# Patient Record
Sex: Female | Born: 1965
Health system: Southern US, Community
[De-identification: ages and names within clinical notes are randomized; demographics above are authoritative.]

## PROBLEM LIST (undated history)

## (undated) DIAGNOSIS — I10 Essential (primary) hypertension: Secondary | ICD-10-CM

## (undated) HISTORY — PX: BREAST BIOPSY: SHX20

## (undated) HISTORY — PX: BREAST CYST EXCISION: SHX579

## (undated) HISTORY — DX: Essential (primary) hypertension: I10

---

## 1999-02-15 HISTORY — PX: BREAST BIOPSY: SHX20

## 1999-02-15 HISTORY — PX: BREAST SURGERY: SHX581

## 1999-07-18 HISTORY — PX: APPENDECTOMY: SHX54

## 2006-09-16 LAB — CONVERTED CEMR LAB

## 2006-09-16 LAB — HM MAMMOGRAPHY

## 2006-11-04 DIAGNOSIS — I10 Essential (primary) hypertension: Secondary | ICD-10-CM

## 2006-11-04 HISTORY — DX: Essential (primary) hypertension: I10

## 2007-02-15 HISTORY — PX: LAPAROSCOPY: SHX197

## 2007-07-05 LAB — CONVERTED CEMR LAB
Cholesterol: 152 mg/dL
HDL: 35 mg/dL
LDL Cholesterol: 97 mg/dL
Triglycerides: 101 mg/dL

## 2008-01-10 ENCOUNTER — Ambulatory Visit: Payer: Self-pay | Admitting: Family Medicine

## 2008-01-10 DIAGNOSIS — J309 Allergic rhinitis, unspecified: Secondary | ICD-10-CM | POA: Insufficient documentation

## 2008-01-10 DIAGNOSIS — J45909 Unspecified asthma, uncomplicated: Secondary | ICD-10-CM | POA: Insufficient documentation

## 2008-01-10 DIAGNOSIS — E118 Type 2 diabetes mellitus with unspecified complications: Secondary | ICD-10-CM | POA: Insufficient documentation

## 2008-01-10 DIAGNOSIS — E559 Vitamin D deficiency, unspecified: Secondary | ICD-10-CM | POA: Insufficient documentation

## 2008-01-10 LAB — CONVERTED CEMR LAB
Albumin/Creatinine Ratio, Urine, POC: <30
Blood Glucose, Fasting: 153 mg/dL
Creatinine, urine POC: 100 mg/dL
Hgb A1c MFr Bld: 6.1 %
Microalbumin U total vol: 30 mg/L

## 2008-01-13 LAB — CONVERTED CEMR LAB
ALT: 16 units/L (ref 0–35)
AST: 16 units/L (ref 0–37)
Albumin: 4.3 g/dL (ref 3.5–5.2)
Alkaline Phosphatase: 66 units/L (ref 39–117)
BUN: 7 mg/dL (ref 6–23)
CO2: 23 meq/L (ref 19–32)
Calcium: 8.9 mg/dL (ref 8.4–10.5)
Chloride: 106 meq/L (ref 96–112)
Creatinine, Ser: 0.61 mg/dL (ref 0.40–1.20)
Glucose, Bld: 110 mg/dL — ABNORMAL HIGH (ref 70–99)
Potassium: 4.4 meq/L (ref 3.5–5.3)
Sodium: 142 meq/L (ref 135–145)
Total Bilirubin: 0.3 mg/dL (ref 0.3–1.2)
Total Protein: 7.1 g/dL (ref 6.0–8.3)
Vit D, 1,25-Dihydroxy: 26 — ABNORMAL LOW (ref 30–89)

## 2008-01-26 ENCOUNTER — Encounter: Payer: Self-pay | Admitting: Family Medicine

## 2008-03-16 ENCOUNTER — Encounter: Payer: Self-pay | Admitting: Family Medicine

## 2008-03-29 ENCOUNTER — Encounter: Payer: Self-pay | Admitting: Family Medicine

## 2008-04-12 ENCOUNTER — Ambulatory Visit: Payer: Self-pay | Admitting: Family Medicine

## 2008-04-12 ENCOUNTER — Encounter: Admission: RE | Admit: 2008-04-12 | Discharge: 2008-04-12 | Payer: Self-pay | Admitting: Family Medicine

## 2008-04-12 DIAGNOSIS — R002 Palpitations: Secondary | ICD-10-CM | POA: Insufficient documentation

## 2008-04-12 LAB — CONVERTED CEMR LAB
Blood Glucose, Fasting: 104 mg/dL
Hgb A1c MFr Bld: 6.7 %

## 2008-04-13 LAB — CONVERTED CEMR LAB
ALT: 17 units/L (ref 0–35)
AST: 13 units/L (ref 0–37)
Albumin: 4.1 g/dL (ref 3.5–5.2)
Alkaline Phosphatase: 76 units/L (ref 39–117)
BUN: 7 mg/dL (ref 6–23)
CO2: 24 meq/L (ref 19–32)
Calcium: 9.2 mg/dL (ref 8.4–10.5)
Chloride: 106 meq/L (ref 96–112)
Creatinine, Ser: 0.67 mg/dL (ref 0.40–1.20)
Glucose, Bld: 66 mg/dL — ABNORMAL LOW (ref 70–99)
HCT: 43.6 % (ref 36.0–46.0)
Hemoglobin: 13.7 g/dL (ref 12.0–15.0)
MCHC: 31.4 g/dL (ref 30.0–36.0)
MCV: 88.3 fL (ref 78.0–100.0)
Platelets: 295 10*3/uL (ref 150–400)
Potassium: 4.6 meq/L (ref 3.5–5.3)
RBC: 4.94 M/uL (ref 3.87–5.11)
RDW: 13.6 % (ref 11.5–15.5)
Sed Rate: 9 mm/hr (ref 0–22)
Sodium: 141 meq/L (ref 135–145)
TSH: 1.949 microintl units/mL (ref 0.350–5.50)
Total Bilirubin: 0.4 mg/dL (ref 0.3–1.2)
Total Protein: 6.7 g/dL (ref 6.0–8.3)
WBC: 10.8 10*3/uL — ABNORMAL HIGH (ref 4.0–10.5)

## 2008-04-25 ENCOUNTER — Ambulatory Visit: Payer: Self-pay | Admitting: Cardiology

## 2008-04-26 ENCOUNTER — Ambulatory Visit: Payer: Self-pay

## 2008-05-02 ENCOUNTER — Encounter: Payer: Self-pay | Admitting: Family Medicine

## 2008-05-02 ENCOUNTER — Ambulatory Visit: Payer: Self-pay

## 2008-06-06 ENCOUNTER — Ambulatory Visit: Payer: Self-pay | Admitting: Cardiology

## 2008-07-11 ENCOUNTER — Ambulatory Visit: Payer: Self-pay | Admitting: Family Medicine

## 2008-07-11 LAB — CONVERTED CEMR LAB
Blood Glucose, Fasting: 189 mg/dL
Hgb A1c MFr Bld: 7.2 %

## 2008-07-12 LAB — CONVERTED CEMR LAB
ALT: 20 units/L (ref 0–35)
AST: 19 units/L (ref 0–37)
Albumin: 4.1 g/dL (ref 3.5–5.2)
Alkaline Phosphatase: 82 units/L (ref 39–117)
BUN: 8 mg/dL (ref 6–23)
Basophils Absolute: 0 10*3/uL (ref 0.0–0.1)
Basophils Relative: 0 % (ref 0–1)
CO2: 22 meq/L (ref 19–32)
Calcium: 9.2 mg/dL (ref 8.4–10.5)
Chloride: 107 meq/L (ref 96–112)
Creatinine, Ser: 0.73 mg/dL (ref 0.40–1.20)
Eosinophils Absolute: 0.2 10*3/uL (ref 0.0–0.7)
Eosinophils Relative: 1 % (ref 0–5)
Glucose, Bld: 161 mg/dL — ABNORMAL HIGH (ref 70–99)
HCT: 39.2 % (ref 36.0–46.0)
Hemoglobin: 12.6 g/dL (ref 12.0–15.0)
Lymphocytes Relative: 31 % (ref 12–46)
Lymphs Abs: 3.7 10*3/uL (ref 0.7–4.0)
MCHC: 32.1 g/dL (ref 30.0–36.0)
MCV: 85.8 fL (ref 78.0–100.0)
Monocytes Absolute: 0.5 10*3/uL (ref 0.1–1.0)
Monocytes Relative: 4 % (ref 3–12)
Neutro Abs: 7.4 10*3/uL (ref 1.7–7.7)
Neutrophils Relative %: 63 % (ref 43–77)
Platelets: 283 10*3/uL (ref 150–400)
Potassium: 4.1 meq/L (ref 3.5–5.3)
RBC: 4.57 M/uL (ref 3.87–5.11)
RDW: 13.4 % (ref 11.5–15.5)
Sodium: 141 meq/L (ref 135–145)
Total Bilirubin: 0.3 mg/dL (ref 0.3–1.2)
Total Protein: 6.8 g/dL (ref 6.0–8.3)
WBC: 11.8 10*3/uL — ABNORMAL HIGH (ref 4.0–10.5)

## 2008-07-25 ENCOUNTER — Ambulatory Visit: Payer: Self-pay | Admitting: Family Medicine

## 2008-07-31 ENCOUNTER — Telehealth: Payer: Self-pay | Admitting: Family Medicine

## 2008-08-16 ENCOUNTER — Ambulatory Visit: Payer: Self-pay | Admitting: Family Medicine

## 2008-10-12 ENCOUNTER — Telehealth (INDEPENDENT_AMBULATORY_CARE_PROVIDER_SITE_OTHER): Payer: Self-pay | Admitting: *Deleted

## 2008-10-17 ENCOUNTER — Encounter: Payer: Self-pay | Admitting: Family Medicine

## 2008-10-18 LAB — CONVERTED CEMR LAB
Cholesterol: 144 mg/dL (ref 0–200)
HDL: 35 mg/dL — ABNORMAL LOW (ref >39)
LDL Cholesterol: 88 mg/dL (ref 0–99)
Total CHOL/HDL Ratio: 4.1
Triglycerides: 104 mg/dL (ref <150)
VLDL: 21 mg/dL (ref 0–40)

## 2008-11-29 ENCOUNTER — Emergency Department (HOSPITAL_BASED_OUTPATIENT_CLINIC_OR_DEPARTMENT_OTHER): Admission: EM | Admit: 2008-11-29 | Discharge: 2008-11-29 | Payer: Self-pay | Admitting: Emergency Medicine

## 2008-11-29 ENCOUNTER — Ambulatory Visit: Payer: Self-pay | Admitting: Family Medicine

## 2008-11-29 ENCOUNTER — Ambulatory Visit: Payer: Self-pay | Admitting: Interventional Radiology

## 2008-11-29 DIAGNOSIS — I10 Essential (primary) hypertension: Secondary | ICD-10-CM | POA: Insufficient documentation

## 2008-11-30 ENCOUNTER — Encounter: Payer: Self-pay | Admitting: Family Medicine

## 2009-01-08 ENCOUNTER — Ambulatory Visit: Payer: Self-pay | Admitting: Family Medicine

## 2009-01-08 LAB — CONVERTED CEMR LAB
Creatinine,U: 300 mg/dL
Hgb A1c MFr Bld: 6.2 %
Microalbumin U total vol: 30 mg/L

## 2009-01-11 ENCOUNTER — Telehealth: Payer: Self-pay | Admitting: Family Medicine

## 2009-06-28 ENCOUNTER — Ambulatory Visit: Payer: Self-pay | Admitting: Family Medicine

## 2009-07-01 LAB — CONVERTED CEMR LAB
ALT: 21 units/L (ref 0–35)
AST: 20 units/L (ref 0–37)
Albumin: 4.1 g/dL (ref 3.5–5.2)
Alkaline Phosphatase: 69 units/L (ref 39–117)
BUN: 7 mg/dL (ref 6–23)
CO2: 24 meq/L (ref 19–32)
Calcium: 9.1 mg/dL (ref 8.4–10.5)
Chloride: 106 meq/L (ref 96–112)
Creatinine, Ser: 0.67 mg/dL (ref 0.40–1.20)
Glucose, Bld: 152 mg/dL — ABNORMAL HIGH (ref 70–99)
Potassium: 4.5 meq/L (ref 3.5–5.3)
Sodium: 140 meq/L (ref 135–145)
Total Bilirubin: 0.4 mg/dL (ref 0.3–1.2)
Total Protein: 6.6 g/dL (ref 6.0–8.3)

## 2009-08-07 ENCOUNTER — Telehealth: Payer: Self-pay | Admitting: Family Medicine

## 2009-08-22 ENCOUNTER — Ambulatory Visit: Payer: Self-pay | Admitting: Family Medicine

## 2009-08-22 ENCOUNTER — Encounter: Admission: RE | Admit: 2009-08-22 | Discharge: 2009-08-22 | Payer: Self-pay | Admitting: Family Medicine

## 2009-08-23 LAB — CONVERTED CEMR LAB
Basophils Absolute: 0.1 10*3/uL (ref 0.0–0.1)
Basophils Relative: 0 % (ref 0–1)
Eosinophils Absolute: 0.1 10*3/uL (ref 0.0–0.7)
Eosinophils Relative: 1 % (ref 0–5)
HCT: 43 % (ref 36.0–46.0)
Hemoglobin: 13.2 g/dL (ref 12.0–15.0)
Lymphocytes Relative: 30 % (ref 12–46)
Lymphs Abs: 3.8 10*3/uL (ref 0.7–4.0)
MCHC: 30.7 g/dL (ref 30.0–36.0)
MCV: 86.9 fL (ref 78.0–100.0)
Monocytes Absolute: 0.5 10*3/uL (ref 0.1–1.0)
Monocytes Relative: 4 % (ref 3–12)
Neutro Abs: 8 10*3/uL — ABNORMAL HIGH (ref 1.7–7.7)
Neutrophils Relative %: 64 % (ref 43–77)
Platelets: 305 10*3/uL (ref 150–400)
RBC: 4.95 M/uL (ref 3.87–5.11)
RDW: 14.6 % (ref 11.5–15.5)
WBC: 12.5 10*3/uL — ABNORMAL HIGH (ref 4.0–10.5)

## 2009-10-22 ENCOUNTER — Ambulatory Visit: Payer: Self-pay | Admitting: Family Medicine

## 2009-10-22 DIAGNOSIS — G609 Hereditary and idiopathic neuropathy, unspecified: Secondary | ICD-10-CM | POA: Insufficient documentation

## 2009-10-24 ENCOUNTER — Encounter: Payer: Self-pay | Admitting: Family Medicine

## 2009-10-25 LAB — CONVERTED CEMR LAB
ALT: 21 units/L (ref 0–35)
AST: 18 units/L (ref 0–37)
Albumin: 4.2 g/dL (ref 3.5–5.2)
Alkaline Phosphatase: 81 units/L (ref 39–117)
BUN: 6 mg/dL (ref 6–23)
CO2: 24 meq/L (ref 19–32)
Calcium: 9 mg/dL (ref 8.4–10.5)
Chloride: 107 meq/L (ref 96–112)
Cholesterol: 143 mg/dL (ref 0–200)
Creatinine, Ser: 0.66 mg/dL (ref 0.40–1.20)
Folate: 10.2 ng/mL
Glucose, Bld: 116 mg/dL — ABNORMAL HIGH (ref 70–99)
HCT: 40.2 % (ref 36.0–46.0)
HDL: 36 mg/dL — ABNORMAL LOW (ref >39)
Hemoglobin: 12.9 g/dL (ref 12.0–15.0)
Iron: 50 ug/dL (ref 42–145)
LDL Cholesterol: 93 mg/dL (ref 0–99)
MCHC: 32.1 g/dL (ref 30.0–36.0)
MCV: 85.7 fL (ref 78.0–100.0)
Magnesium: 2.1 mg/dL (ref 1.5–2.5)
Platelets: 263 10*3/uL (ref 150–400)
Potassium: 4.7 meq/L (ref 3.5–5.3)
RBC: 4.69 M/uL (ref 3.87–5.11)
RDW: 14 % (ref 11.5–15.5)
Saturation Ratios: 13 % — ABNORMAL LOW (ref 20–55)
Sed Rate: 19 mm/hr (ref 0–22)
Sodium: 141 meq/L (ref 135–145)
TIBC: 391 ug/dL (ref 250–470)
TSH: 2.052 microintl units/mL (ref 0.350–4.500)
Total Bilirubin: 0.4 mg/dL (ref 0.3–1.2)
Total CHOL/HDL Ratio: 4
Total Protein: 6.7 g/dL (ref 6.0–8.3)
Triglycerides: 70 mg/dL (ref <150)
UIBC: 341 ug/dL
VLDL: 14 mg/dL (ref 0–40)
Vit D, 25-Hydroxy: 25 ng/mL — ABNORMAL LOW (ref 30–89)
Vitamin B-12: 327 pg/mL (ref 211–911)
WBC: 8.2 10*3/uL (ref 4.0–10.5)

## 2009-10-26 LAB — HM DIABETES EYE EXAM: HM Diabetic Eye Exam: NORMAL

## 2009-12-06 ENCOUNTER — Telehealth: Payer: Self-pay | Admitting: Family Medicine

## 2010-02-04 ENCOUNTER — Encounter: Payer: Self-pay | Admitting: Family Medicine

## 2010-04-17 ENCOUNTER — Encounter: Payer: Self-pay | Admitting: Family Medicine

## 2010-04-17 ENCOUNTER — Ambulatory Visit: Payer: Self-pay | Admitting: Family

## 2010-04-17 DIAGNOSIS — N912 Amenorrhea, unspecified: Secondary | ICD-10-CM | POA: Insufficient documentation

## 2010-04-17 LAB — CONVERTED CEMR LAB
Beta hcg, urine, semiquantitative: NEGATIVE
Bilirubin Urine: NEGATIVE
Creatinine,U: 200 mg/dL
Glucose, Urine, Semiquant: NEGATIVE
Hgb A1c MFr Bld: 6.5 %
Microalbumin U total vol: 30 mg/L
Nitrite: NEGATIVE
Specific Gravity, Urine: 1.025
Urobilinogen, UA: 0.2
pH: 6

## 2010-04-18 LAB — CONVERTED CEMR LAB
BUN: 10 mg/dL (ref 6–23)
CO2: 23 meq/L (ref 19–32)
Calcium: 9.2 mg/dL (ref 8.4–10.5)
Chloride: 105 meq/L (ref 96–112)
Creatinine, Ser: 0.61 mg/dL (ref 0.40–1.20)
Glucose, Bld: 80 mg/dL (ref 70–99)
Potassium: 4.4 meq/L (ref 3.5–5.3)
Sodium: 139 meq/L (ref 135–145)

## 2010-04-19 ENCOUNTER — Encounter: Payer: Self-pay | Admitting: Family

## 2010-07-15 ENCOUNTER — Telehealth: Payer: Self-pay | Admitting: Family

## 2010-08-19 ENCOUNTER — Encounter: Admission: RE | Admit: 2010-08-19 | Discharge: 2010-08-19 | Payer: Self-pay | Admitting: Family Medicine

## 2010-08-19 ENCOUNTER — Ambulatory Visit: Payer: Self-pay | Admitting: Family Medicine

## 2010-08-19 DIAGNOSIS — M25569 Pain in unspecified knee: Secondary | ICD-10-CM | POA: Insufficient documentation

## 2010-08-19 LAB — HM DIABETES FOOT EXAM

## 2010-08-19 LAB — CONVERTED CEMR LAB: Hgb A1c MFr Bld: 6.9 %

## 2010-08-21 ENCOUNTER — Encounter: Payer: Self-pay | Admitting: Family Medicine

## 2010-08-30 ENCOUNTER — Encounter: Admission: RE | Admit: 2010-08-30 | Discharge: 2010-08-30 | Payer: Self-pay | Admitting: Orthopedic Surgery

## 2010-09-08 ENCOUNTER — Encounter: Payer: Self-pay | Admitting: Family Medicine

## 2010-09-09 ENCOUNTER — Encounter: Payer: Self-pay | Admitting: Family Medicine

## 2010-09-29 ENCOUNTER — Encounter: Payer: Self-pay | Admitting: Family Medicine

## 2010-10-16 HISTORY — PX: OTHER SURGICAL HISTORY: SHX169

## 2010-10-16 HISTORY — PX: BREAST LUMPECTOMY WITH AXILLARY LYMPH NODE DISSECTION: SHX5756

## 2010-10-16 HISTORY — PX: ABDOMINAL HYSTERECTOMY: SHX81

## 2010-12-16 NOTE — Letter (Signed)
Summary: [facility]  [facility]   Imported By: Lanelle Bal 10/13/2010 08:34:45  _____________________________________________________________________  External Attachment:    Type:   Image     Comment:   External Document

## 2010-12-16 NOTE — Consult Note (Signed)
Summary: [facility]   Imported By: Lanelle Bal 09/03/2010 14:31:04  _____________________________________________________________________  External Attachment:    Type:   Image     Comment:   External Document

## 2010-12-16 NOTE — Progress Notes (Signed)
Summary: Needs 90 day supply  Phone Note Refill Request   Refills Requested: Medication #1:  LISINOPRIL 40 MG  TABS Take one tablet by mouth once a day  Medication #2:  GLYBURIDE-METFORMIN 2.5-500 MG TABS Take 1 tablet by mouth once a day  Medication #3:  LANTUS SOLOSTAR 100 UNIT/ML SOLN on 15 units Subcutaneously. Pt must use [facility] for 90 day supply. Please print for Pt to mail. Pt is waiting.   Initial call taken by: Payton Spark CMA,  December 06, 2009 8:49 AM    Prescriptions: LANTUS SOLOSTAR 100 UNIT/ML SOLN (INSULIN GLARGINE) on 15 units Subcutaneously.  #90 day sup x 0   Entered and Authorized by:   Nani Gasser MD   Signed by:   Nani Gasser MD on 12/06/2009   Method used:   Print then Give to Patient   RxID:   [id number] LISINOPRIL 40 MG  TABS (LISINOPRIL) Take one tablet by mouth once a day  #90 x 0   Entered by:   Payton Spark CMA   Authorized by:   Nani Gasser MD   Signed by:   Nani Gasser MD on 12/06/2009   Method used:   Print then Give to Patient   RxID:   [id number] GLYBURIDE-METFORMIN 2.5-500 MG TABS (GLYBURIDE-METFORMIN) Take 1 tablet by mouth once a day  #90 x 0   Entered by:   Payton Spark CMA   Authorized by:   Nani Gasser MD   Signed by:   Nani Gasser MD on 12/06/2009   Method used:   Print then Give to Patient   RxID:   [id number]

## 2010-12-16 NOTE — Medication Information (Signed)
Summary: Possible Nonadherence/[facility]  Possible Nonadherence/[facility]   Imported By: Lanelle Bal 02/19/2010 13:46:33  _____________________________________________________________________  External Attachment:    Type:   Image     Comment:   External Document

## 2010-12-16 NOTE — Progress Notes (Signed)
Summary: refill clarification  Phone Note Outgoing Call Call back at [phone]  cell   Call placed by: Mervin Kung, cma (aama) Call placed to: Patient Summary of Call: Left message for pt to return my call.  Received refill request for Metformin 500mg  1 two times a day #30.  Need to ask pt why we are only giving her #30 at a time? [facility] says #60 would still be a $4 copay. Nicki Guadalajara Fergerson CMA Duncan Dull)  July 15, 2010 12:16 PM   Follow-up for Phone Call        Left message on machine to return my call.  Nicki Guadalajara Fergerson CMA Duncan Dull)  July 16, 2010 1:33 PM   Pt returned my call and stated that she thinks the directions increased but the quantity did not get changed on the last rx. Advised pt she was due for follow up in September with Dr. Linford Arnold and she stated she would call in the next couple of weeks to make appt. Refill sent to pharmacy. Nicki Guadalajara Fergerson CMA Duncan Dull)  July 16, 2010 4:31 PM

## 2010-12-16 NOTE — Miscellaneous (Signed)
Summary: Pap  Clinical Lists Changes      Last PAP:  Unknown (09/16/2006 11:26:47 AM) PAP Result Date:  09/08/2010 PAP Next Due:  3 yr

## 2010-12-16 NOTE — Assessment & Plan Note (Signed)
Summary: 3 mo. f/u on diabetes, knee pain   Vital Signs:  Patient profile:   45 year old female Height:      61.5 inches Weight:      194 pounds BMI:     36.19 Pulse rate:   76 / minute BP sitting:   134 / 83  (right arm) Cuff size:   regular  Vitals Entered By: Avon Gully CMA, Duncan Dull) (August 19, 2010 9:38 AM) CC: f/u diabetes, rt knee pain x 1 month, not getting better with aleve   Primary Care Drue Harr:  Linford Arnold, C  CC:  f/u diabetes, rt knee pain x 1 month, and not getting better with aleve.  History of Present Illness: f/u diabetes, rt knee pain x 1 month, not getting better with aleve.    about a month ago was going up steep steps and the next day her right knee started hurting. Then noticed her knee giving out. Will occ hear a crunching sounds.  Then last week felt like bees stingin her in her knee. Feels tight in the back of the knee. Occ swelling. Does stands on her feet all day. Aleve helps some. Occ burning and pain from the knee down the anterior shin.  Tried icy hot yesterday.  Aggrevated with walking. Pain even with laying in the bed.    Diabetes Management History:      The patient is a 45 years old female who comes in for evaluation of Type 2 Diabetes Mellitus.  She is (or has been) enrolled in the "Diabetic Education Program".  She states understanding of dietary principles and is following her diet appropriately.  No sensory loss is reported.  Self foot exams are being performed.  She is checking home blood sugars.  She says that she is not exercising regularly.        Hypoglycemic symptoms are not occurring.  No hyperglycemic symptoms are reported.  Other comments include: Sugars have been getting better. Recenly broker her meter. .        There are no symptoms to suggest diabetic complications.  Since her last visit, no infections have occurred.  No changes have been made to her treatment plan since last visit.    Current Medications (verified): 1)   Lisinopril 40 Mg  Tabs (Lisinopril) .... Take One Tablet By Mouth Once A Day 2)  Metformin Hcl 500 Mg Tabs (Metformin Hcl) .... One Tablet By Mouth Two Times A Day 3)  Novofine Needles 30 Guage .... For The Lantus Pen. 4)  Test Strips One Touch Ultra View .... 2 Bottles (50 Total Strips) 5)  Lantus Solostar 100 Unit/ml Soln (Insulin Glargine) .... On 22 Units Subcutaneously At Bedtime 6)  Lifescan Unistik Ii Lancets  Misc (Lancets) .... Use As Directed 7)  Cipro 500 Mg Tabs (Ciprofloxacin Hcl) .... One Tablet By Mouth Two Times A Day X 3 Days  Allergies (verified): 1)  ! Asmanex 30 Metered Doses (Mometasone Furoate) 2)  Hydrochlorothiazide 3)  Metoprolol Succinate (Metoprolol Succinate)  Comments:  Nurse/Medical Assistant: The patient's medications and allergies were reviewed with the patient and were updated in the Medication and Allergy Lists. Avon Gully CMA, Duncan Dull) (August 19, 2010 9:39 AM)  Physical Exam  General:  Well-developed,well-nourished,in no acute distress; alert,appropriate and cooperative throughout examination Head:  Normocephalic and atraumatic without obvious abnormalities. No apparent alopecia or balding. Neck:  No deformities, masses, or tenderness noted. Lungs:  Normal respiratory effort, chest expands symmetrically. Lungs are clear to auscultation,  no crackles or wheezes. Heart:  Normal rate and regular rhythm. S1 and S2 normal without gallop, murmur, click, rub or other extra sounds. Msk:  Right knee with trace edema. Tender along the lateral joint line along the edege of the patella. Also tender over the inferior patellar tendon.  Pain with full extension. Some crepitus. Mild inc laxity with anterior drawer on the right, compared to the left.  Neg McMurrays but did cause a popping in the knee and was painful.   Pulses:  Radial 2+  Extremities:  No LE edema.  Skin:  no rashes.   Psych:  Cognition and judgment appear intact. Alert and cooperative with normal  attention span and concentration. No apparent delusions, illusions, hallucinations  Diabetes Management Exam:    Foot Exam (with socks and/or shoes not present):       Sensory-Monofilament:          Left foot: normal          Right foot: normal   Impression & Recommendations:  Problem # 1:  KNEE PAIN, ACUTE (ICD-719.46)  Will give a knee sleeve for supporte and compression since knee giving out. Will get xray thought likely will be normal. Rec elevation and NSAID and will refer to ortho for next week.   Orders: Orthopedic Referral (Ortho) T-DG Knee 2 Views*R* ([address]) EMR Misc Charge Code [facility])  Her updated medication list for this problem includes:    Diclofenac Sodium 75 Mg Tbec (Diclofenac sodium) .Marland Kitchen... Take 1 tablet by mouth two times a day  Problem # 2:  DIABETES MELLITUS, TYPE II (ICD-250.00) At goal. Encouraged more regular exercise once knee is better.  F/U in 3 months. Flu vac given today.  Her updated medication list for this problem includes:    Lisinopril 40 Mg Tabs (Lisinopril) .Marland Kitchen... Take one tablet by mouth once a day    Metformin Hcl 500 Mg Tabs (Metformin hcl) ..... One tablet by mouth two times a day    Lantus Solostar 100 Unit/ml Soln (Insulin glargine) ..... On 25  units subcutaneously at bedtime  Orders: Hemoglobin A1C (83036) Fingerstick (60454)  Complete Medication List: 1)  Lisinopril 40 Mg Tabs (Lisinopril) .... Take one tablet by mouth once a day 2)  Metformin Hcl 500 Mg Tabs (Metformin hcl) .... One tablet by mouth two times a day 3)  Novofine Needles 30 Guage  .... For the lantus pen. 4)  Test Strips One Touch Ultra View  .... 2 bottles (50 total strips) 5)  Lantus Solostar 100 Unit/ml Soln (Insulin glargine) .... On 25  units subcutaneously at bedtime 6)  Lifescan Unistik Ii Lancets Misc (Lancets) .... Use as directed 7)  Diclofenac Sodium 75 Mg Tbec (Diclofenac sodium) .... Take 1 tablet by mouth two times a day 8)  Glucometer  .... Strips  and lancets to test daily dx 250.00  Other Orders: Admin 1st Vaccine (09811) Flu Vaccine 110yrs + (91478)  Diabetes Management Assessment/Plan:      Her blood pressure goal is < 130/80.    Patient Instructions: 1)  For your knee, elevate it, knee sleeve for compression, and sent over a rx antiinflammatory. 2)  Will schedule with ortho for next week 3)  Will call you with your xray results. 4)  Please schedule a follow-up appointment in 3 months for Diabetes .  Prescriptions: GLUCOMETER strips and lancets to test daily Dx 250.00  #90 days sup x 2   Entered and Authorized by:   Nani Gasser MD  Signed by:   Nani Gasser MD on 08/19/2010   Method used:   Print then Give to Patient   RxID:   [id number] DICLOFENAC SODIUM 75 MG TBEC (DICLOFENAC SODIUM) Take 1 tablet by mouth two times a day  #45 x 0   Entered and Authorized by:   Nani Gasser MD   Signed by:   Nani Gasser MD on 08/19/2010   Method used:   Electronically to        [facility]  [address] (retail)       [address]       [place], [place]  [address]       Ph: [phone]       Fax: [phone]   RxID:   [id number]   Laboratory Results   Blood Tests   Date/Time Received: 08/19/10 Date/Time Reported: 08/19/10  HGBA1C: 6.9%   (Normal Range: Non-Diabetic - 3-6%   Control Diabetic - 6-8%)    Flu Vaccine Consent Questions     Do you have a history of severe allergic reactions to this vaccine? no    Any prior history of allergic reactions to egg and/or gelatin? no    Do you have a sensitivity to the preservative Thimersol? no    Do you have a past history of Guillan-Barre Syndrome? no    Do you currently have an acute febrile illness? no    Have you ever had a severe reaction to latex? no    Vaccine information given and explained to patient? yes    Are you currently pregnant? no    Lot Number:[id number]   Exp Date:05/16/2011   Site Given  Left Deltoid IM  HGBA1C: 6.9%    (Normal Range: Non-Diabetic - 3-6%   Control Diabetic - 6-8%)    .lbflu

## 2010-12-16 NOTE — Assessment & Plan Note (Signed)
Summary: FU//VGJ   Vital Signs:  Patient profile:   45 year old female Height:      61.5 inches Weight:      195 pounds Pulse rate:   108 / minute BP sitting:   130 / 78  (left arm) Cuff size:   regular  Vitals Entered By: Kathlene November (April 17, 2010 3:28 PM) CC: followup diabetes   Primary Care Provider:  Linford Arnold, C  CC:  followup diabetes.  History of Present Illness: Ms Kuyper is a 45 year old female who presents today for follow up of her diabetes.  1)DM- Notes that her fasting sugars 160- low 200's.  She notes that she occasionally develops hypoglycemia on the weekends due to not eating breakfast at the same time.  Usually does not check post-prandial sugars.    2)Urinary frequency- + dribbling incontinence.  3) AmenorrheaLMP 02/22/09- has done 2 home pregnancy tests. +hot flashes- follows at [facility].  Current Medications (verified): 1)  Lisinopril 40 Mg  Tabs (Lisinopril) .... Take One Tablet By Mouth Once A Day 2)  Glyburide-Metformin 2.5-500 Mg Tabs (Glyburide-Metformin) .... Take 1 Tablet By Mouth Once A Day 3)  Novofine Needles 30 Guage .... For The Lantus Pen. 4)  Test Strips One Touch Ultra View .... 2 Bottles (50 Total Strips) 5)  Lantus Solostar 100 Unit/ml Soln (Insulin Glargine) .... On 15 Units Subcutaneously. 6)  Lifescan Unistik Ii Lancets  Misc (Lancets) .... Use As Directed 7)  Claritin 10 Mg Tabs (Loratadine) .... Take 1 Tablet By Mouth Once A Day  Allergies (verified): 1)  ! Asmanex 30 Metered Doses (Mometasone Furoate) 2)  Hydrochlorothiazide 3)  Metoprolol Succinate (Metoprolol Succinate)  Comments:  Nurse/Medical Assistant: The patient's medications and allergies were reviewed with the patient and were updated in the Medication and Allergy Lists. Kathlene November (April 17, 2010 3:29 PM)  Physical Exam  General:  Well-developed,well-nourished,in no acute distress; alert,appropriate and cooperative throughout examination Lungs:  Normal respiratory  effort, chest expands symmetrically. Lungs are clear to auscultation, no crackles or wheezes. Heart:  Normal rate and regular rhythm. S1 and S2 normal without gallop, murmur, click, rub or other extra sounds.   Impression & Recommendations:  Problem # 1:  DIABETES MELLITUS, TYPE II (ICD-250.00)  last eye exam 09/26/09,  podiatry 11/14/09 A1C is 6.5- was 5.8.  Fasting sugars are generally running high (as high as 200) but patient does have occasional hypoglycemic events.  She attributes this to skipping breakfast on the weekends.  Takes Lantus 22 units and glyburide2.5/metfomin once daily.  Due to hypoglycemic events, will discontinue glyburide and place patient on a two times a day dosing of metformin.  Recommended that she eat a regular breakfast and try to keep consistent meal schedule even on the weekends.  Also asked patient to keep journal and call nurse with these readings in 1 week.    Her updated medication list for this problem includes:    Lisinopril 40 Mg Tabs (Lisinopril) .Marland Kitchen... Take one tablet by mouth once a day    Metformin Hcl 500 Mg Tabs (Metformin hcl) ..... One tablet by mouth two times a day    Lantus Solostar 100 Unit/ml Soln (Insulin glargine) ..... On 22 units subcutaneously at bedtime  Orders: Fingerstick (27253) Creatinine  (66440) UA Dipstick w/o Micro (automated)  (81003) Urine Microalbumin (34742)  Problem # 2:  UTI (ICD-599.0) Assessment: New  Her updated medication list for this problem includes:    Cipro 500 Mg Tabs (  Ciprofloxacin hcl) ..... One tablet by mouth two times a day x 3 days  Orders: T-Culture, Urine ([address]-09811)  Problem # 3:  AMENORRHEA (ICD-626.0) Assessment: New Urine pregnancy test is negative.  Pt plans to follow up with her GYN   Complete Medication List: 1)  Lisinopril 40 Mg Tabs (Lisinopril) .... Take one tablet by mouth once a day 2)  Metformin Hcl 500 Mg Tabs (Metformin hcl) .... One tablet by mouth two times a day 3)   Novofine Needles 30 Guage  .... For the lantus pen. 4)  Test Strips One Touch Ultra View  .... 2 bottles (50 total strips) 5)  Lantus Solostar 100 Unit/ml Soln (Insulin glargine) .... On 22 units subcutaneously at bedtime 6)  Lifescan Unistik Ii Lancets Misc (Lancets) .... Use as directed 7)  Claritin 10 Mg Tabs (Loratadine) .... Take 1 tablet by mouth once a day 8)  Cipro 500 Mg Tabs (Ciprofloxacin hcl) .... One tablet by mouth two times a day x 3 days  Other Orders: Hemoglobin A1C (91478) Urine Pregnancy Test  (29562) T-Basic Metabolic Panel ([address]86-57846)  Patient Instructions: 1)  Eat a regular breakfast on the weekend morning. 2)  Call if your sugars are over 300 or less than 80. 3)  Please call office next week and let us know how your sugars are running.   4)  Follow up in 3 months. Prescriptions: CIPRO 500 MG TABS (CIPROFLOXACIN HCL) one tablet by mouth two times a day x 3 days  #6 x 0   Entered and Authorized by:   Lemont Fillers FNP   Signed by:   Lemont Fillers FNP on 04/17/2010   Method used:   Electronically to        [facility] (retail)       [address]       [place], [place]  [address]       Ph: [phone]       Fax: [phone]   RxID:   [id number] LANTUS SOLOSTAR 100 UNIT/ML SOLN (INSULIN GLARGINE) on 22 units Subcutaneously at bedtime  #1 x 2   Entered and Authorized by:   Lemont Fillers FNP   Signed by:   Lemont Fillers FNP on 04/17/2010   Method used:   Electronically to        [facility] (retail)       [address]       [place], [place]  [address]       Ph: [phone]       Fax: [phone]   RxID:   [id number] LISINOPRIL 40 MG  TABS (LISINOPRIL) Take one tablet by mouth once a day  #30 x 2   Entered and Authorized by:   Lemont Fillers FNP   Signed by:   Lemont Fillers FNP on 04/17/2010   Method used:   Electronically to        [facility] [address] (retail)       [address]       [place], [place]  [address]       Ph: [phone]       Fax: [phone]   RxID:   [id number] METFORMIN HCL 500 MG TABS (METFORMIN HCL) one tablet by mouth two times a day  #30 x 2   Entered and Authorized by:   Lemont Fillers FNP   Signed by:   Katrinka Blazing  Peggyann Juba FNP on 04/17/2010   Method used:   Electronically to        [facility]* (retail)       [address]       [place], [place]  [address]       Ph: [phone]       Fax: [phone]   RxID:   [id number]   Laboratory Results   Urine Tests  Date/Time Received: 04/17/2010 Date/Time Reported: 04/17/2010  Routine Urinalysis   Color: yellow Appearance: Clear Glucose: negative   (Normal Range: Negative) Bilirubin: negative   (Normal Range: Negative) Ketone: trace (5)   (Normal Range: Negative) Spec. Gravity: 1.025   (Normal Range: 1.003-1.035) Blood: trace-intact   (Normal Range: Negative) pH: 6.0   (Normal Range: 5.0-8.0) Protein: trace   (Normal Range: Negative) Urobilinogen: 0.2   (Normal Range: 0-1) Nitrite: negative   (Normal Range: Negative) Leukocyte Esterace: trace   (Normal Range: Negative)  Microalbumin (urine): 30 mg/L Creatinine: 200mg /dL  A:C Ratio 30mg /g Urine HCG: negative  Blood Tests   Date/Time Received: 04/17/2010 Date/Time Reported: 04/17/2010  HGBA1C: 6.5%   (Normal Range: Non-Diabetic - 3-6%   Control Diabetic - 6-8%)  Date/Time Received: 04/17/2010 Date/Time Reported: 04/17/2010

## 2010-12-16 NOTE — Letter (Signed)
Summary: [facility]  [facility]   Imported By: Lanelle Bal 09/24/2010 11:04:49  _____________________________________________________________________  External Attachment:    Type:   Image     Comment:   External Document

## 2010-12-23 ENCOUNTER — Ambulatory Visit: Payer: Self-pay | Admitting: Family Medicine

## 2011-01-02 ENCOUNTER — Encounter: Payer: Self-pay | Admitting: Family Medicine

## 2011-01-02 ENCOUNTER — Ambulatory Visit (INDEPENDENT_AMBULATORY_CARE_PROVIDER_SITE_OTHER): Payer: BC Managed Care – PPO | Admitting: Family Medicine

## 2011-01-02 DIAGNOSIS — E119 Type 2 diabetes mellitus without complications: Secondary | ICD-10-CM

## 2011-01-02 LAB — CONVERTED CEMR LAB
ALT: 35 units/L (ref 0–35)
AST: 29 units/L (ref 0–37)
Albumin: 4.4 g/dL (ref 3.5–5.2)
Alkaline Phosphatase: 95 units/L (ref 39–117)
BUN: 7 mg/dL (ref 6–23)
CO2: 25 meq/L (ref 19–32)
Calcium: 9.2 mg/dL (ref 8.4–10.5)
Chloride: 100 meq/L (ref 96–112)
Cholesterol: 155 mg/dL (ref 0–200)
Creatinine, Ser: 0.62 mg/dL (ref 0.40–1.20)
Hgb A1c MFr Bld: 7.6 %
LDL Cholesterol: 92 mg/dL (ref 0–99)
Potassium: 4.6 meq/L (ref 3.5–5.3)
Total CHOL/HDL Ratio: 4
Total Protein: 6.9 g/dL (ref 6.0–8.3)
VLDL: 24 mg/dL (ref 0–40)

## 2011-01-07 NOTE — Assessment & Plan Note (Signed)
Summary: diabetes chk   Vital Signs:  Patient profile:   45 year old female Height:      61.5 inches Weight:      192 pounds Pulse rate:   99 / minute BP sitting:   129 / 78  (right arm) Cuff size:   regular  Vitals Entered By: Avon Gully CMA, (AAMA) (January 02, 2011 8:16 AM) CC: f/u DM, Hypertension Management   CC:  f/u DM and Hypertension Management.  Diabetes Management History:      The patient is a 45 years old female who comes in for evaluation of Type 2 Diabetes Mellitus.  She is (or has been) enrolled in the "Diabetic Education Program".  She states understanding of dietary principles and is following her diet appropriately.  She is checking home blood sugars.  She says that she is not exercising regularly.        Hypoglycemic symptoms are not occurring.  No hyperglycemic symptoms are reported.        Since her last visit, no infections have occurred.    Hypertension History:      Positive major cardiovascular risk factors include diabetes, hypertension, and current tobacco user.  Negative major cardiovascular risk factors include female age less than 17 years old and negative family history for ischemic heart disease.     Current Medications (verified): 1)  Lisinopril 40 Mg  Tabs (Lisinopril) .... Take One Tablet By Mouth Once A Day 2)  Metformin Hcl 500 Mg Tabs (Metformin Hcl) .... One Tablet By Mouth Two Times A Day 3)  Novofine Needles 30 Guage .... For The Lantus Pen. 4)  Test Strips One Touch Ultra View .... 2 Bottles (50 Total Strips) 5)  Lantus Solostar 100 Unit/ml Soln (Insulin Glargine) .... On 25  Units Subcutaneously At Bedtime 6)  Lifescan Unistik Ii Lancets  Misc (Lancets) .... Use As Directed 7)  Diclofenac Sodium 75 Mg Tbec (Diclofenac Sodium) .... Take 1 Tablet By Mouth Two Times A Day 8)  Glucometer .... Strips and Lancets To Test Daily Dx 250.00  Allergies (verified): 1)  ! Asmanex 30 Metered Doses (Mometasone Furoate) 2)   Hydrochlorothiazide 3)  Metoprolol Succinate (Metoprolol Succinate)  Comments:  Nurse/Medical Assistant: The patient's medications and allergies were reviewed with the patient and were updated in the Medication and Allergy Lists. Avon Gully CMA, Duncan Dull) (January 02, 2011 8:17 AM)  Past History:  Past Surgical History: Breast Bx of cyst 02/1999 Appendectomy 07/1999 Laparoscopy for cyst and endometriosis in 02/2007 Left breast BX 10/2010 Hysterectomy 10/2010  Dx HTN 11/04/06 Dx Asthma 04/25/07 Dx DM 11/12/06  Physical Exam  General:  Well-developed,well-nourished,in no acute distress; alert,appropriate and cooperative throughout examination Head:  Normocephalic and atraumatic without obvious abnormalities. No apparent alopecia or balding. Eyes:  No corneal or conjunctival inflammation noted. EOMI. Perrla.  Ears:  External ear exam shows no significant lesions or deformities.  Otoscopic examination reveals clear canals, tympanic membranes are intact bilaterally without bulging, retraction, inflammation or discharge. Hearing is grossly normal bilaterally. Nose:  External nasal examination shows no deformity or inflammation.  Mouth:  Oral mucosa and oropharynx without lesions or exudates.  Teeth in good repair. Lungs:  Normal respiratory effort, chest expands symmetrically. Lungs are clear to auscultation, no crackles or wheezes. Heart:  Normal rate and regular rhythm. S1 and S2 normal without gallop, murmur, click, rub or other extra sounds. Pulses:  RAdial 2+ bilat.  Skin:  no rashes.   Psych:  Cognition and judgment appear intact.  Alert and cooperative with normal attention span and concentration. No apparent delusions, illusions, hallucinations   Impression & Recommendations:  Problem # 1:  DIABETES MELLITUS, TYPE II (ICD-250.00) Assessment Deteriorated  Has lost a couple of pounds and her BP is at goal.  Says most sugars 130-150 and occ 300s.   Discussed avoiding sugary  drinks.  Increase lantus as below Due for labs. F/U in one onths. Bring in sugar log with her.  Her updated medication list for this problem includes:    Lisinopril 40 Mg Tabs (Lisinopril) .Marland Kitchen... Take one tablet by mouth once a day    Metformin Hcl 500 Mg Tabs (Metformin hcl) ..... One tablet by mouth two times a day    Lantus Solostar 100 Unit/ml Soln (Insulin glargine) ..... On 30  units subcutaneously at bedtime  Orders: Fingerstick (16109) Hemoglobin A1C (60454) T-Comprehensive Metabolic Panel [phone]) T-Lipid Profile [phone])  Labs Reviewed: Creat: 0.61 (04/17/2010)   Microalbumin: 30 (04/17/2010)  Last Eye Exam: normal (10/26/2009) Reviewed HgBA1c results: 6.9 (08/19/2010)  6.5 (04/17/2010)  Complete Medication List: 1)  Lisinopril 40 Mg Tabs (Lisinopril) .... Take one tablet by mouth once a day 2)  Metformin Hcl 500 Mg Tabs (Metformin hcl) .... One tablet by mouth two times a day 3)  Novofine Needles 30 Guage  .... For the lantus pen. 4)  Test Strips One Touch Ultra View  .... 2 bottles (50 total strips) 5)  Lantus Solostar 100 Unit/ml Soln (Insulin glargine) .... On 30  units subcutaneously at bedtime 6)  Lifescan Unistik Ii Lancets Misc (Lancets) .... Use as directed 7)  Diclofenac Sodium 75 Mg Tbec (Diclofenac sodium) .... Take 1 tablet by mouth two times a day 8)  Glucometer  .... Strips and lancets to test daily dx 250.00 9)  Estrace 1 Mg Tabs (Estradiol) .... Take 1 tablet by mouth once a day  Diabetes Management Assessment/Plan:      Her blood pressure goal is < 130/80.    Hypertension Assessment/Plan:      The patient's hypertensive risk group is category C: Target organ damage and/or diabetes.  Her calculated 10 year risk of coronary heart disease is 11 %.  Today's blood pressure is 129/78.  Her blood pressure goal is < 130/80.  Patient Instructions: 1)  go up to Lantus 30 units.  and then each days go up by one until your morning fasting sugar is under  130. When get there then stop adn stay at that dose.  2)  Please schedule a follow-up appointment in 1 month.  3)  Remember to get your eyes checked.    Orders Added: 1)  Fingerstick [36416] 2)  Hemoglobin A1C [83036] 3)  T-Comprehensive Metabolic Panel [80053-22900] 4)  T-Lipid Profile [80061-22930] 5)  Est. Patient Level IV [57846]    Laboratory Results   Blood Tests   Date/Time Received: 01/02/11 Date/Time Reported: 01/02/11  HGBA1C: 7.6%   (Normal Range: Non-Diabetic - 3-6%   Control Diabetic - 6-8%)

## 2011-01-23 ENCOUNTER — Encounter: Payer: Self-pay | Admitting: Emergency Medicine

## 2011-01-23 ENCOUNTER — Ambulatory Visit (INDEPENDENT_AMBULATORY_CARE_PROVIDER_SITE_OTHER): Payer: BC Managed Care – PPO | Admitting: Emergency Medicine

## 2011-01-23 DIAGNOSIS — J069 Acute upper respiratory infection, unspecified: Secondary | ICD-10-CM

## 2011-01-23 DIAGNOSIS — E119 Type 2 diabetes mellitus without complications: Secondary | ICD-10-CM

## 2011-01-23 LAB — CONVERTED CEMR LAB
Blood Glucose, Fingerstick: 175
Rapid Strep: NEGATIVE

## 2011-01-27 NOTE — Assessment & Plan Note (Signed)
Summary: SICK,COUGH,SNEEZING, SORE THROAT/WSE   Vital Signs:  Patient Profile:   45 Years Old Female CC:      cough, itchy eyesand throat, SOB, congestion Height:     61.5 inches Weight:      189 pounds O2 Sat:      100 % O2 treatment:    Room Air Temp:     98.5 degrees F oral Pulse rate:   105 / minute Resp:     16 per minute BP sitting:   113 / 81  (left arm) Cuff size:   large  Vitals Entered By: Lajean Saver RN (January 23, 2011 5:25 PM)             Is Patient Diabetic? Yes  CBG Result 175      Updated Prior Medication List: LISINOPRIL 40 MG  TABS (LISINOPRIL) Take one tablet by mouth once a day METFORMIN HCL 500 MG TABS (METFORMIN HCL) one tablet by mouth two times a day * NOVOFINE NEEDLES 30 GUAGE for the Lantus pen. * TEST STRIPS ONE TOUCH ULTRA VIEW 2 bottles (50 total strips) LANTUS SOLOSTAR 100 UNIT/ML SOLN (INSULIN GLARGINE) on 30  units Subcutaneously at bedtime LIFESCAN UNISTIK II LANCETS  MISC (LANCETS) use as directed DICLOFENAC SODIUM 75 MG TBEC (DICLOFENAC SODIUM) Take 1 tablet by mouth two times a day * GLUCOMETER strips and lancets to test daily Dx 250.00 ESTRACE 1 MG TABS (ESTRADIOL) Take 1 tablet by mouth once a day  Current Allergies (reviewed today): ! ASMANEX 30 METERED DOSES (MOMETASONE FUROATE) HYDROCHLOROTHIAZIDE METOPROLOL SUCCINATE (METOPROLOL SUCCINATE)History of Present Illness History from: patient Chief Complaint: cough, itchy eyesand throat, SOB, congestion History of Present Illness: 45 Years Old Female complains of onset of cold symptoms for 2 weeks.  Danielle has been using Robitussin which is helping a little bit. + sore throat + cough No pleuritic pain No wheezing + nasal congestion + post-nasal drainage + sinus pain/pressure + chest congestion No itchy/red eyes No earache No hemoptysis No SOB No chills/sweats No fever No nausea No vomiting No abdominal pain No diarrhea No skin rashes No fatigue No myalgias No  headache   REVIEW OF SYSTEMS Constitutional Symptoms      Denies fever, chills, night sweats, weight loss, weight gain, and fatigue.  Eyes       Complains of eye pain and eye drainage.      Denies change in vision, glasses, contact lenses, and eye surgery.      Comments: itchy Ear/Nose/Throat/Mouth       Complains of sinus problems.      Denies hearing loss/aids, change in hearing, ear pain, ear discharge, dizziness, frequent runny nose, frequent nose bleeds, sore throat, hoarseness, and tooth pain or bleeding.      Comments: congestion Respiratory       Complains of dry cough, productive cough, and shortness of breath.      Denies wheezing, asthma, bronchitis, and emphysema/COPD.  Cardiovascular       Denies murmurs, chest pain, and tires easily with exhertion.    Gastrointestinal       Denies stomach pain, nausea/vomiting, diarrhea, constipation, blood in bowel movements, and indigestion. Genitourniary       Denies painful urination, kidney stones, and loss of urinary control. Neurological       Denies paralysis, seizures, and fainting/blackouts. Musculoskeletal       Denies muscle pain, joint pain, joint stiffness, decreased range of motion, redness, swelling, muscle weakness, and gout.  Skin  Denies bruising, unusual mles/lumps or sores, and hair/skin or nail changes.  Psych       Denies mood changes, temper/anger issues, anxiety/stress, speech problems, depression, and sleep problems. Other Comments: symptoms x 2 weeks. Prod cough in AM dry cough during the day   Past History:  Past Medical History: Reviewed history from 11/29/2008 and no changes required. Podiatry - Dr. Yates [facility] [place] [facility] - [facility] Diabetes mellitus, type II Hypertension  Past Surgical History: Reviewed history from 01/02/2011 and no changes required. Breast Bx of cyst 02/1999 Appendectomy 07/1999 Laparoscopy for cyst and endometriosis in 02/2007 Left  breast BX 10/2010 Hysterectomy 10/2010  Dx HTN 11/04/06 Dx Asthma 04/25/07 Dx DM 11/12/06  Family History: Reviewed history from 04/12/2008 and no changes required. FAther with DM Mother wiht RA, CHF Grandparents are DM, heart dz  Social History: Reviewed history from 06/28/2009 and no changes required. 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  Married to [employer] with one child.  Child applying for college.  Current Smoker Alcohol use-no Drug use-no Regular exercise-no Physical Exam General appearance: well developed, well nourished, no acute distress Ears: normal, no lesions or deformities Nasal: mucosa pink, nonedematous, no septal deviation, turbinates normal Oral/Pharynx: tongue normal, posterior pharynx without erythema or exudate Neck: tender ant cerv LAD Chest/Lungs: no rales, wheezes, or rhonchi bilateral, breath sounds equal without effort Heart: regular rate and  rhythm, no murmur MSE: oriented to time, place, and person Assessment New Problems: UPPER RESPIRATORY INFECTION, ACUTE (ICD-465.9)   Plan New Medications/Changes: CHERATUSSIN AC 100-10 MG/5ML SYRP (GUAIFENESIN-CODEINE) 5cc q6 hrs as needed for cough  #5oz x 0, 01/23/2011, Hoyt Koch MD ZITHROMAX Z-PAK 250 MG TABS (AZITHROMYCIN) use as directed  #1 x 0, 01/23/2011, Hoyt Koch MD  New Orders: Est. Patient Level IV [16109] Pulse Oximetry (single measurment) [94760] Rapid Strep [60454] Capillary Blood Glucose/CBG [09811] Planning Comments:   Rapid strep negative.  CBG = 175 1)  Take the prescribed antibiotic as instructed. 2)  Use nasal saline solution (over the counter) at least 3 times a day. 3)  Use over the counter decongestants like Zyrtec-D every 12 hours as needed to help with congestion. 4)  Can take tylenol every 6 hours or motrin every 8 hours for pain or fever. 5)  Follow up with your primary doctor  if no improvement in 5-7 days, sooner if increasing pain, fever,  or new symptoms.    The patient and/or caregiver has been counseled thoroughly with regard to medications prescribed including dosage, schedule, interactions, rationale for use, and possible side effects and they verbalize understanding.  Diagnoses and expected course of recovery discussed and will return if not improved as expected or if the condition worsens. Patient and/or caregiver verbalized understanding.  Prescriptions: CHERATUSSIN AC 100-10 MG/5ML SYRP (GUAIFENESIN-CODEINE) 5cc q6 hrs as needed for cough  #5oz x 0   Entered and Authorized by:   Hoyt Koch MD   Signed by:   Hoyt Koch MD on 01/23/2011   Method used:   Print then Give to Patient   RxID:   [id number] ZITHROMAX Z-PAK 250 MG TABS (AZITHROMYCIN) use as directed  #1 x 0   Entered and Authorized by:   Hoyt Koch MD   Signed by:   Hoyt Koch MD on 01/23/2011   Method used:   Print then Give to Patient   RxID:   [id number]   Orders Added: 1)  Est. Patient Level IV [40102] 2)  Pulse Oximetry (single measurment) [94760] 3)  Rapid Strep [33295] 4)  Capillary Blood Glucose/CBG [82948]    Laboratory Results   Blood Tests     CBG Random:: 175mg /dL  Date/Time Received: January 23, 2011 5:57 PM  Date/Time Reported: January 23, 2011 5:57 PM   Other Tests  Rapid Strep: negative  Kit Test Internal QC: Negative   (Normal Range: Negative)

## 2011-01-29 ENCOUNTER — Encounter: Payer: Self-pay | Admitting: Family Medicine

## 2011-01-29 ENCOUNTER — Ambulatory Visit (INDEPENDENT_AMBULATORY_CARE_PROVIDER_SITE_OTHER): Payer: BC Managed Care – PPO | Admitting: Family Medicine

## 2011-01-29 DIAGNOSIS — E119 Type 2 diabetes mellitus without complications: Secondary | ICD-10-CM

## 2011-02-03 NOTE — Assessment & Plan Note (Signed)
Summary: diabeties recheck   Vital Signs:  Patient profile:   45 year old female Height:      61.5 inches Weight:      188 pounds Pulse rate:   89 / minute BP sitting:   116 / 75  (right arm) Cuff size:   large  Vitals Entered By: Avon Gully CMA, Duncan Dull) (January 29, 2011 8:17 AM)  Primary Care Provider:  Linford Arnold, C   History of Present Illness: WEnt to UC last week and tx with zpack for sinusitis. AM sugar was 262.  Only been at goal the last 2 months..  Feels foggy when sugars are high. Lowest was 67, but had skipped a meal. Started walking recently, feels very tired.  Still drink one soda a day and an unsweetened tea.  Already at 36 units of Lantus. 30 day average is 195 per her meter.  Has lost 4lbs in one month.   Diabetes Management History:      The patient is a 45 years old female who comes in for evaluation of Type 2 Diabetes Mellitus.  She is (or has been) enrolled in the "Diabetic Education Program".  She is checking home blood sugars.  She says that she is not exercising regularly.    Current Medications (verified): 1)  Lisinopril 40 Mg  Tabs (Lisinopril) .... Take One Tablet By Mouth Once A Day 2)  Metformin Hcl 500 Mg Tabs (Metformin Hcl) .... One Tablet By Mouth Two Times A Day 3)  Novofine Needles 30 Guage .... For The Lantus Pen. 4)  Test Strips One Touch Ultra View .... 2 Bottles (50 Total Strips) 5)  Lantus Solostar 100 Unit/ml Soln (Insulin Glargine) .... On 30  Units Subcutaneously At Bedtime 6)  Lifescan Unistik Ii Lancets  Misc (Lancets) .... Use As Directed 7)  Glucometer .... Strips and Lancets To Test Daily Dx 250.00 8)  Estrace 1 Mg Tabs (Estradiol) .... Take 1 Tablet By Mouth Once A Day  Allergies (verified): 1)  ! Asmanex 30 Metered Doses (Mometasone Furoate) 2)  Hydrochlorothiazide 3)  Metoprolol Succinate (Metoprolol Succinate)  Comments:  Nurse/Medical Assistant: The patient's medications and allergies were reviewed with the patient and  were updated in the Medication and Allergy Lists. Avon Gully CMA, Duncan Dull) (January 29, 2011 8:18 AM)  Physical Exam  General:  Well-developed,well-nourished,in no acute distress; alert,appropriate and cooperative throughout examination Head:  Normocephalic and atraumatic without obvious abnormalities. No apparent alopecia or balding. Eyes:  No corneal or conjunctival inflammation noted. EOMI. Perrla. Ears:  External ear exam shows no significant lesions or deformities.  Otoscopic examination reveals clear canals, tympanic membranes are intact bilaterally without bulging, retraction, inflammation or discharge. Hearing is grossly normal bilaterally. Nose:  External nasal examination shows no deformity or inflammation.  Mouth:  Oral mucosa and oropharynx without lesions or exudates.  Teeth in good repair. Neck:  No deformities, masses, or tenderness noted. Lungs:  Normal respiratory effort, chest expands symmetrically. Lungs are clear to auscultation, no crackles or wheezes. Heart:  Normal rate and regular rhythm. S1 and S2 normal without gallop, murmur, click, rub or other extra sounds. Pulses:  RAdial  Skin:  no rashes.   Psych:  Cognition and judgment appear intact. Alert and cooperative with normal attention span and concentration. No apparent delusions, illusions, hallucinations   Impression & Recommendations:  Problem # 1:  DIABETES MELLITUS, TYPE II (ICD-250.00) Add onglyza and increase your Lantus to 45 units.   F/ U in one month. Due for eye  exam.  Her updated medication list for this problem includes:    Lisinopril 40 Mg Tabs (Lisinopril) .Marland Kitchen... Take one tablet by mouth once a day    Kombiglyze Xr 5-500 Mg Xr24h-tab (Saxagliptin-metformin) .Marland Kitchen... Take 1 tablet by mouth once a day    Lantus Solostar 100 Unit/ml Soln (Insulin glargine) ..... On 45  units subcutaneously at bedtime  Complete Medication List: 1)  Lisinopril 40 Mg Tabs (Lisinopril) .... Take one tablet by mouth once  a day 2)  Kombiglyze Xr 5-500 Mg Xr24h-tab (Saxagliptin-metformin) .... Take 1 tablet by mouth once a day 3)  Novofine Needles 30 Guage  .... For the lantus pen. 4)  Test Strips One Touch Ultra View  .... 2 bottles (50 total strips) 5)  Lantus Solostar 100 Unit/ml Soln (Insulin glargine) .... On 45  units subcutaneously at bedtime 6)  Lifescan Unistik Ii Lancets Misc (Lancets) .... Use as directed 7)  Glucometer  .... Strips and lancets to test daily dx 250.00 8)  Estrace 1 Mg Tabs (Estradiol) .... Take 1 tablet by mouth once a day 9)  Fluticasone Propionate 50 Mcg/act Susp (Fluticasone propionate) .... 2 sprays in each nostril dail yfor allergies  Diabetes Management Assessment/Plan:      Her blood pressure goal is < 130/80.    Patient Instructions: 1)  Remember to get your eye exam.  2)  Please schedule a follow-up appointment in 1 month for diabetes.  Prescriptions: FLUTICASONE PROPIONATE 50 MCG/ACT SUSP (FLUTICASONE PROPIONATE) 2 sprays in each nostril dail yfor allergies  #1 x 0   Entered and Authorized by:   Nani Gasser MD   Signed by:   Nani Gasser MD on 01/29/2011   Method used:   Electronically to        [facility]  [address] (retail)       [address]       [place], [place]  [address]       Ph: [phone]       Fax: [phone]   RxID:   [id number] KOMBIGLYZE XR 5-500 MG XR24H-TAB (SAXAGLIPTIN-METFORMIN) Take 1 tablet by mouth once a day  #30 x 1   Entered and Authorized by:   Nani Gasser MD   Signed by:   Nani Gasser MD on 01/29/2011   Method used:   Electronically to        [facility]  [address] (retail)       [address]       [place], [place]  [address]       Ph: [phone]       Fax: [phone]   RxID:   [id number]    Orders Added: 1)  Est. Patient Level III [55732]

## 2011-02-15 ENCOUNTER — Encounter: Payer: Self-pay | Admitting: Family Medicine

## 2011-02-17 ENCOUNTER — Ambulatory Visit (INDEPENDENT_AMBULATORY_CARE_PROVIDER_SITE_OTHER): Payer: BC Managed Care – PPO | Admitting: Family Medicine

## 2011-02-17 ENCOUNTER — Encounter: Payer: Self-pay | Admitting: Family Medicine

## 2011-02-17 DIAGNOSIS — E119 Type 2 diabetes mellitus without complications: Secondary | ICD-10-CM

## 2011-02-17 DIAGNOSIS — R42 Dizziness and giddiness: Secondary | ICD-10-CM

## 2011-02-17 DIAGNOSIS — I10 Essential (primary) hypertension: Secondary | ICD-10-CM

## 2011-02-17 NOTE — Assessment & Plan Note (Signed)
Also will change Lantus to Levemir for one week. Samples given.. Start  With 50 units and inc by 1 unit daily until fasting under 130.  She really has already made a lot of healthy diet changes but her sugar are all over the place on her glucommeter.  She may benefit from counseling if we can't get hre under control. Did labs at her last OV.  F/U in 2 weeks to make sure getting better and check her A1C.

## 2011-02-17 NOTE — Assessment & Plan Note (Addendum)
Discussed she may be getting low BPs at home that may be causing her sxs. Cut BP pill in half.   Recheck in 2 weeks.  Could consider home machine if she would like. Recommend Omron arm cuff.

## 2011-02-17 NOTE — Progress Notes (Signed)
  Subjective:    Patient ID: Terri Zimmerman, female    DOB: [birth year], 45 y.o.   MRN: [id number]  HPI Will get a pressure sensation in her head when she stands up quickly a;nd then her heart will race. Uusally 5 out of 7 days doesn't feel well.  Says during the day has HAs and pressure and finally by the evening she feels better. She has been running form low 100 to the 200s.  Denies any new sxs with the kombiglyze.  Feels dizzy frequently. She feels disconnected. Says this AM her sugar was normal and still felt disconnected until 1PM vs her usually feeling lingering until about 6 PM. No sinus pain, pressure or nasal congestion. No CP or SOB.  She is not taking any othe OtC med or new supplements.   Doesn't check BP at home.   Review of Systems     Objective:   Physical Exam  Constitutional: She is oriented to person, place, and time. She appears well-developed and well-nourished.  HENT:  Head: Normocephalic and atraumatic.  Neck:       No carotid bruits.   Cardiovascular: Normal rate, regular rhythm and normal heart sounds.   Pulmonary/Chest: Effort normal and breath sounds normal.  Musculoskeletal: She exhibits no edema.  Neurological: She is alert and oriented to person, place, and time.  Skin: Skin is warm.  Psychiatric: She has a normal mood and affect.          Assessment & Plan:  Dizzy - Will dec BP pill in half. Her BP looks great but certainly this is a SE so will try half a dose and see if by the end of the week she feels better. Also will change Lantus to Levemir for one week. Samples given.. Start  With 50 units and inc by 1 unit daily until fasting under 130.  She really has already made a lot of healthy diet changes but her sugar are all over the place on her glucommeter.  She may benefit from counseling if we can't get hre under control. Did labs at her last OV.  F/U in 2 weeks to make sure getting better and check her A1C.

## 2011-02-17 NOTE — Patient Instructions (Signed)
Cut BP pill in half daily. Change lantus to levemir. Start with 50 units nightly. Increase by one unit a day if the morning sugar (fasting) is over 130.  Once get under 130 then stay at that dose.   Call me at the end of the week to let me know how you are feeling.

## 2011-02-23 ENCOUNTER — Telehealth: Payer: Self-pay | Admitting: *Deleted

## 2011-02-23 MED ORDER — INSULIN GLARGINE 100 UNIT/ML ~~LOC~~ SOLN: 50.0000 [IU] | Freq: Every day | SUBCUTANEOUS | Status: DC

## 2011-02-23 NOTE — Telephone Encounter (Signed)
With levemir go up 2 units a day 10, then 12, then 14 until fasting is under 130 and then stay on that dose. Will send over a new rx.

## 2011-02-23 NOTE — Telephone Encounter (Signed)
Pt states she started Levimir she is feeling better but fasting is still not under 130. She also reports that inj site stays red for 2 days but is not itchy. She did cut BP med in 1/2 and doing well on that. Pt does need Rx for Levimir if you want her to stay on it. Please advise.

## 2011-02-24 NOTE — Telephone Encounter (Signed)
Pt aware of the above  

## 2011-02-28 ENCOUNTER — Encounter: Payer: Self-pay | Admitting: Family Medicine

## 2011-03-02 LAB — BASIC METABOLIC PANEL
BUN: 7 mg/dL (ref 6–23)
CO2: 24 mEq/L (ref 19–32)
Calcium: 9.2 mg/dL (ref 8.4–10.5)
Chloride: 105 mEq/L (ref 96–112)
Creatinine, Ser: 0.6 mg/dL (ref 0.4–1.2)
GFR calc Af Amer: >60 mL/min (ref >60)
GFR calc non Af Amer: >60 mL/min (ref >60)
Glucose, Bld: 110 mg/dL — ABNORMAL HIGH (ref 70–99)
Potassium: 3.7 mEq/L (ref 3.5–5.1)
Sodium: 138 mEq/L (ref 135–145)

## 2011-03-02 LAB — POCT CARDIAC MARKERS
CKMB, poc: <1 ng/mL — ABNORMAL LOW (ref 1.0–8.0)
Myoglobin, poc: 26.1 ng/mL (ref 12–200)
Myoglobin, poc: 33.3 ng/mL (ref 12–200)

## 2011-03-02 LAB — CBC
Hemoglobin: 11.9 g/dL — ABNORMAL LOW (ref 12.0–15.0)
RBC: 4.29 MIL/uL (ref 3.87–5.11)
WBC: 11.5 10*3/uL — ABNORMAL HIGH (ref 4.0–10.5)

## 2011-03-02 LAB — DIFFERENTIAL
Basophils Relative: 0 % (ref 0–1)
Lymphocytes Relative: 30 % (ref 12–46)
Lymphs Abs: 3.5 10*3/uL (ref 0.7–4.0)
Monocytes Absolute: 0.3 10*3/uL (ref 0.1–1.0)
Monocytes Relative: 3 % (ref 3–12)
Neutro Abs: 7.5 10*3/uL (ref 1.7–7.7)
Neutrophils Relative %: 65 % (ref 43–77)

## 2011-03-02 LAB — PROTIME-INR
INR: 1 (ref 0.00–1.49)
Prothrombin Time: 13 seconds (ref 11.6–15.2)

## 2011-03-02 LAB — APTT: aPTT: 28 seconds (ref 24–37)

## 2011-03-03 ENCOUNTER — Ambulatory Visit (INDEPENDENT_AMBULATORY_CARE_PROVIDER_SITE_OTHER): Payer: BC Managed Care – PPO | Admitting: Family Medicine

## 2011-03-03 DIAGNOSIS — I10 Essential (primary) hypertension: Secondary | ICD-10-CM

## 2011-03-03 DIAGNOSIS — E119 Type 2 diabetes mellitus without complications: Secondary | ICD-10-CM

## 2011-03-03 LAB — POCT GLYCOSYLATED HEMOGLOBIN (HGB A1C): Hemoglobin A1C: 6.8

## 2011-03-03 MED ORDER — INSULIN GLARGINE 100 UNIT/ML ~~LOC~~ SOLN: 58.0000 [IU] | Freq: Every day | SUBCUTANEOUS | Status: DC

## 2011-03-03 MED ORDER — LISINOPRIL 20 MG PO TABS: 20.0000 mg | ORAL_TABLET | Freq: Every day | ORAL | Status: DC

## 2011-03-03 MED ORDER — SAXAGLIPTIN-METFORMIN ER 5-1000 MG PO TB24: 5.0000 mg | ORAL_TABLET | Freq: Every day | ORAL | Status: DC

## 2011-03-03 NOTE — Progress Notes (Signed)
  Subjective:    Patient ID: Terri Zimmerman, female    DOB: 1966/01/31, 45 y.o.   MRN: [id number]  HPI Did use the levemir sample and ran out. Now up to 58 units. Now getting some lows in teh morning. She never received the rx for levemir even thought she called here several times. Taking her medications regularly. She thinks she may be due for her eye exam.    Review of Systems BP 127/78  Pulse 72  Wt 188 lb (85.276 kg)     Objective:   Physical Exam  Constitutional: She appears well-developed and well-nourished.  HENT:  Head: Normocephalic and atraumatic.  Cardiovascular: Normal rate, regular rhythm and normal heart sounds.   Pulmonary/Chest: Effort normal and breath sounds normal.          Assessment & Plan:

## 2011-03-03 NOTE — Patient Instructions (Signed)
Make sure to get your eye exam.

## 2011-03-03 NOTE — Assessment & Plan Note (Signed)
Will change lisinopril to 20mg  as her BP still looks great on half the tab of 40mg .

## 2011-03-03 NOTE — Assessment & Plan Note (Addendum)
Stay with Lantus. New rx sent for the solostar.  Also will inc kombiglyze to 03/999. Samples given and new rx sent.  F/U in 3 months.  No results found for this basename: MICROALBUR, [id number]   Lab Results  Component Value Date   HGBA1C 6.8 03/03/2011  A1C at goal, though having occasional highs.

## 2011-03-17 ENCOUNTER — Telehealth: Payer: Self-pay | Admitting: Family Medicine

## 2011-03-17 NOTE — Telephone Encounter (Signed)
Pt called and stated she received golfer's elbow information through the mail in error.  Also stated that she had tried to call our office last Friday and spoke with front office staff, and they in turn transferred her to Dr. Shelah Lewandowsky nurse voice mail, and she left detailed message and never got a return call.  This is not an isolated incident.  Had already spoken to Dr. Linford Arnold in the past regarding not getting return phone calls from our office.  Pt is very frustrated at this point.  Dr. Linford Arnold or Sue Lush do you remember this and could you possibly know which patient should have received this information??  Please advise. Jarvis Newcomer, LPN [facility]

## 2011-03-17 NOTE — Telephone Encounter (Signed)
I did not received a message from this pt on Friday. I dont remember who the golfers elbw sheet was for. Called and left a message on pt vm to apologize that she did not get a return call and that she may call my direct line with any further concerns. Also let her know we have been short staffed and we have a new computer system

## 2011-03-17 NOTE — Telephone Encounter (Signed)
I don't remember who the golfers elbow sheet was for, so I apologize this was sent to her in error.   I am sorry she didn't get a return phone call. What did she need on Friday?  Is it something we can address now? If we can let her know this is an issue that we are working on an that we have very short staffed for the last 4 months.

## 2011-03-31 NOTE — Assessment & Plan Note (Signed)
[facility]                            CARDIOLOGY OFFICE NOTE   NAME:Hickok, KATLYN MULDREW                        MRN:          [id number]  DATE:04/25/2008                            DOB:          September 16, 1966    Ms. Penn is a 45 year old female with a past medical history of  diabetes, hypertension, fibromyalgia, who we are asked to evaluate for  chest pain and palpitations.  She has no prior cardiac history.  She  typically does not have significant dyspnea on exertion although she  does more extreme activities.  There is no orthopnea or PND but there  can occasionally be mild pedal edema.  She does not have exertional  chest pain.  Approximately 1 month ago while relaxing the patient had  sudden onset of substernal chest pain.  The pain was described as a  sharp sensation.  It lasted for approximately 2 hours and then slowly  resolved.  There was no associated nausea, vomiting, shortness of breath  or diaphoresis.  The pain was not pleuritic, positional nor related to  food.  It was not exertional.  It did not radiate.  She had a second  episode of pain the following day but she has had no pain since then.  She also has had several episodes of palpitations in the past 1 month.  They are sudden in onset and described as her heart racing.  They last  for approximately 30 minutes and resolve spontaneously.  There is mild  presyncope with this as well as shortness of breath but there is no  chest pain.  Because of the above we were asked to further evaluate.   MEDICATIONS:  1. At present include lisinopril 40 mg p.o. daily.  2. Glucophage 500 mg p.o. daily.  3. Flonase.  4. Os-Cal.  5. Prilosec/Pepcid.   ALLERGIES:  She has no known drug allergies.   SOCIAL HISTORY:  She smokes approximately one half pack of cigarettes  per day.  She does not consume alcohol.   FAMILY HISTORY:  She thinks that her father had coronary disease.  She  is not sure (she does  not have a close relationship with him).   PAST MEDICAL HISTORY:  Significant for diabetes mellitus, hypertension,  but there is no hyperlipidemia.  She does have a history of  fibromyalgia.  She has had a history of asthma.  She has had a previous  appendectomy and laparoscopic surgery for endometriosis and a cyst.  She  has had previous breast biopsy.  There is no other significant past  medical history noted.   REVIEW OF SYSTEMS:  She denies any headaches or fevers or chills.  She  does have a chronic cough.  There is no hemoptysis.  There is no  dysphagia, odynophagia, melena or hematochezia.  There is no dysuria or  hematuria.  There is no rash or seizure activity.  There is no  orthopnea, PND but there can be occasional mild pedal edema.  The  remaining systems are negative.   PHYSICAL EXAMINATION:  VITAL SIGNS:  Examination today shows  a blood  pressure of 142/91 and a pulse of 99.  She weighs 196 pounds.  GENERAL:  She is well-developed and well-nourished and in no acute  distress.  SKIN:  Her skin is warm and dry.  She does not appear to be depressed.  There is no peripheral clubbing.  BACK:  Normal.  HEENT:  Normal.  Normal eyelids.  NECK:  Supple with normal upstroke bilaterally.  No bruits noted.  There  is no jugular venous distention and no thyromegaly.  CHEST:  Clear to auscultation and normal expansion.  CARDIOVASCULAR:  Reveals a regular rate and rhythm with normal S1 and  S2.  There are no murmurs, rubs or gallops noted.  ABDOMEN:  Nontender.  Positive bowel sounds.  No hepatosplenomegaly.  No  mass appreciated.  There is no abdominal bruit.  She has 2+ femoral  pulses bilaterally and no bruits.  EXTREMITIES:  Show no edema I can palpate and no cords.  She has 2+  dorsalis pedis pulses bilaterally.  NEUROLOGICAL:  Grossly intact.   I do have an electrocardiogram dated Apr 12, 2008.  At that time she had  a normal sinus rhythm.  A prior inferior infarct cannot be  excluded.   DIAGNOSES:  1. Chest pain - the etiology of this is unclear at present.  I am not      convinced that it was cardiac but she does have multiple risk      factors.  We will plan to proceed with a stress Myoview.  If it      shows normal perfusion we will not pursue further ischemia      evaluation.  2. Palpitations - we will schedule her to have a cardiac event monitor      for further evaluation.  3. Tobacco abuse - we discussed the importance of discontinuing this.  4. Hypertension - her blood pressure is mildly elevated today.  This      can be tracked and additional medications added as indicated      (possibly low dose HCTZ).  5. Diabetes mellitus - management per her primary care physician.  6. Fibromyalgia - management per her primary care physician.  7. We will see her back in 4-6 weeks.     Madolyn Frieze Jens Som, MD, [facility]  Electronically Signed    BSC/MedQ  DD: 04/25/2008  DT: 04/25/2008  Job #: [id number]   cc:   Nani Gasser, M.D.

## 2011-03-31 NOTE — Assessment & Plan Note (Signed)
[facility]                            CARDIOLOGY OFFICE NOTE   NAME:Rocks, MONICA CODD                        MRN:          [id number]  DATE:06/06/2008                            DOB:          1966-01-25    Mrs. Mcphearson is a very pleasant 45 year old female that I recently saw an  May 31, 2008, secondary to chest pain and palpitations.  A Myoview was  performed on May 02, 2008.  Note at that time, she was found to have no  significant ischemia and her ejection fraction was 60%.  She also had a  monitor placed that showed sinus rhythm.  However, she had no  palpitations during that monitor.  Since we last saw her, she denies any  dyspnea or chest pain and has been no syncope.  She continues to have an  occasional palpitation.  These are described as her heart flutter  briefly and then resolved.  There is no associated chest pain or  syncope.   PRESENT MEDICATIONS:  1. Lisinopril 40 mg p.o. daily.  2. Glucophage 500 mg daily.  3. Flonase.  4. Os-Cal.  5. Prilosec.   PHYSICAL EXAMINATION:  VITAL SIGNS:  Today, shows a blood pressure of  139/90 and her pulse is 113.  She weighs 193 pounds.  HEENT:  Normal.  NECK:  Supple.  CHEST:  Clear.  CARDIOVASCULAR:  Regular rate and rhythm.  ABDOMEN:  No tenderness.  EXTREMITIES:  No edema.   DIAGNOSES:  1. Palpitations - etiology, this is unclear to me.  Note, her monitor      showed no significant arrhythmia, but she did not have any symptoms      while she had this.  They may be related to premature ventricular      contractions.  Her left ventricular function is normal.  I have      explained that if indeed she does have premature ventricular      contractions, these are benign in the setting of a normal left      ventricular function.  We have elected to follow this.  If they      worsen in the future then we can consider a low-dose beta-blocker.  2. Chest pain - her Myoview shows no abnormalities and  her symptoms      have improved.  We will not pursue this further.  3. Tobacco abuse - she needs to discontinue this.  4. Hypertension - her blood pressure is mildly elevated today.  I have      asked her to follow up with her primary care physician about this      issue and other medications can be added as needed.  5. Diabetes mellitus - management per her primary care physician.  We      have discussed the importance of the risk factor modification and I      have asked her to take enteric-coated aspirin 81 mg daily.  She      would also benefit from a statin long term as diabetes is a  coronary artery disease equivalent.  I will leave this to Dr.      Linford Arnold.  6. Note, the previous diagnosis of fibromyalgia - this appears to be      incorrect and will not be included in her records in the future.   We will see her back on as needed basis if her palpitations worsen.     Madolyn Frieze Jens Som, MD, [facility]  Electronically Signed    BSC/MedQ  DD: 06/06/2008  DT: 06/07/2008  Job #: [id number]   cc:   Nani Gasser, M.D.

## 2011-04-20 ENCOUNTER — Other Ambulatory Visit: Payer: Self-pay | Admitting: Family Medicine

## 2011-05-10 ENCOUNTER — Other Ambulatory Visit: Payer: Self-pay | Admitting: Family Medicine

## 2011-06-09 ENCOUNTER — Other Ambulatory Visit: Payer: Self-pay | Admitting: Family Medicine

## 2011-06-12 ENCOUNTER — Ambulatory Visit (INDEPENDENT_AMBULATORY_CARE_PROVIDER_SITE_OTHER): Payer: BC Managed Care – PPO | Admitting: Family Medicine

## 2011-06-12 ENCOUNTER — Encounter: Payer: Self-pay | Admitting: Family Medicine

## 2011-06-12 DIAGNOSIS — R5381 Other malaise: Secondary | ICD-10-CM

## 2011-06-12 DIAGNOSIS — IMO0001 Reserved for inherently not codable concepts without codable children: Secondary | ICD-10-CM

## 2011-06-12 DIAGNOSIS — R5383 Other fatigue: Secondary | ICD-10-CM

## 2011-06-12 DIAGNOSIS — E119 Type 2 diabetes mellitus without complications: Secondary | ICD-10-CM

## 2011-06-12 DIAGNOSIS — M791 Myalgia, unspecified site: Secondary | ICD-10-CM

## 2011-06-12 LAB — POCT GLYCOSYLATED HEMOGLOBIN (HGB A1C): Hemoglobin A1C: 6.1

## 2011-06-12 MED ORDER — INSULIN GLARGINE 100 UNIT/ML ~~LOC~~ SOLN: 60.0000 [IU] | Freq: Every day | SUBCUTANEOUS | Status: DC

## 2011-06-12 NOTE — Progress Notes (Signed)
Subjective:    Patient ID: Terri Zimmerman, female    DOB: August 13, 1966, 45 y.o.   MRN: [id number]  Diabetes She presents for her follow-up diabetic visit. She has type 2 diabetes mellitus. Her disease course has been stable. There are no hypoglycemic associated symptoms. Pertinent negatives for diabetes include no chest pain, no polydipsia and no polyuria. There are no hypoglycemic complications. Symptoms are stable. There are no diabetic complications.  Occ lows in the afternoon.    For a long time has noticed really tender spots on her body. Feels she has really lost her energy for the last month. Not sleeping well.  This week has been the worse.  Says hurting all through her body.  Muscles hurt in her back, hips, thighs and upper arm. Doesn't feel like joint pain or a pulled muscle.  Noticed some hand tingling.  No blood in your stool.  More frequent stools.  Hurts to sit because even her bottom is sore.  Stretching feels a little bette and helps some. Has taken Aleve - didn't help at all.  Says normally aleve works really well for her. Hx of complete hysterectomy. Has been  Having more hot flashes during the day and night. She denies feeling depressed.  Review of Systems  Cardiovascular: Negative for chest pain.  Genitourinary: Negative for polyuria.  Hematological: Negative for polydipsia.      BP 139/84  Pulse 90  Ht 5' 1.5" (1.562 m)  Wt 189 lb (85.73 kg)  BMI 35.13 kg/m2    Allergies  Allergen Reactions  . Hydrochlorothiazide     REACTION: Fatigue  . Metoprolol Succinate     REACTION: Headache, sever    Past Medical History  Diagnosis Date  . Hypertension 11-04-06  . Asthma 04-25-07  . Diabetes mellitus 11-12-06    type 2    Past Surgical History  Procedure Date  . Breast surgery 4-00    of cyst  . Appendectomy 9-00  . Laparoscopy 4-08    for cyst and endometriosis  . Left breast bx 12-11  . Abdominal hysterectomy 12-11    History   Social History  . Marital  Status: Married    Spouse Name: N/A    Number of Children: N/A  . Years of Education: N/A   Occupational History  . Not on file.   Social History Main Topics  . Smoking status: Current Everyday Smoker  . Smokeless tobacco: Not on file  . Alcohol Use: No  . Drug Use: No  . Sexually Active:    Other Topics Concern  . Not on file   Social History Narrative  . No narrative on file    Family History  Problem Relation Age of Onset  . Other Mother     RA and CHF  . Diabetes Father   . Diabetes Other   . Heart disease Other     Ms. Dullea had no medications administered during this visit.  Objective:   Physical Exam  Constitutional: She is oriented to person, place, and time. She appears well-developed and well-nourished.  HENT:  Head: Normocephalic and atraumatic.  Neck: Neck supple. No thyromegaly present.  Cardiovascular: Normal rate, regular rhythm and normal heart sounds.   Pulmonary/Chest: Effort normal and breath sounds normal.  Abdominal: Soft. Bowel sounds are normal. She exhibits no distension and no mass. There is tenderness. There is no rebound and no guarding.       Tender in the RLQ and LLQ.  Musculoskeletal: She exhibits no edema.       Hip, knee and ankle rom and strength are normla. Tender over the right and left paraspinous muscles and flank. Also very tender over the right lateral hip  Lymphadenopathy:    She has no cervical adenopathy.  Neurological: She is alert and oriented to person, place, and time.  Skin: Skin is warm and dry.  Psychiatric: She has a normal mood and affect. Her behavior is normal.          Assessment & Plan:  Myalgia-we'll rule out causes such as electrolyte imbalance, anemia, thyroid disorder. Also check a CK to rule out any type of excessive muscle breakdown. She's not on any statin that should be causing this. There is a risk of lactic acidosis with Kombiglyze and we will certainly check that as well. She was tender in  right and left lower quadrants to consider other etiologies such as diverticulitis that could also be radiating to her back and down her legs. She has been having some night sweats but no fever or period we will check a CBC.  Fatigue-this could be related to the muscle aches and pains. We will also evaluate her thyroid and test her for anemia. I don't think that this is depression as she doesn't seem to have a depressed mood. No problem-specific assessment & plan notes found for this encounter.

## 2011-06-12 NOTE — Assessment & Plan Note (Addendum)
Looks great today at goal. Will check urine micro at next OV.  Her eye exam is upto date. Had that in March.  Rec dec lantus to 60 units and if still having occ lows in the afternoon then dec to 58 units. Followup in 3 months. If she continues to do this well we might even be up to decrease her insulin a little more. Very happy with her progress.

## 2011-06-13 LAB — COMPLETE METABOLIC PANEL WITH GFR
ALT: 23 U/L (ref 0–35)
AST: 20 U/L (ref 0–37)
Albumin: 4.1 g/dL (ref 3.5–5.2)
Alkaline Phosphatase: 87 U/L (ref 39–117)
BUN: 9 mg/dL (ref 6–23)
Potassium: 4.1 mEq/L (ref 3.5–5.3)

## 2011-06-13 LAB — CBC WITH DIFFERENTIAL/PLATELET
Eosinophils Relative: 1 % (ref 0–5)
HCT: 39.9 % (ref 36.0–46.0)
Lymphocytes Relative: 26 % (ref 12–46)
Lymphs Abs: 2.9 10*3/uL (ref 0.7–4.0)
MCH: 27.8 pg (ref 26.0–34.0)
MCV: 85.3 fL (ref 78.0–100.0)
Monocytes Absolute: 0.5 10*3/uL (ref 0.1–1.0)
RBC: 4.68 MIL/uL (ref 3.87–5.11)
RDW: 13.9 % (ref 11.5–15.5)
WBC: 11.3 10*3/uL — ABNORMAL HIGH (ref 4.0–10.5)

## 2011-06-13 LAB — VITAMIN D 25 HYDROXY (VIT D DEFICIENCY, FRACTURES): Vit D, 25-Hydroxy: 32 ng/mL (ref 30–89)

## 2011-06-13 LAB — CK: Total CK: 82 U/L (ref 7–177)

## 2011-06-14 ENCOUNTER — Telehealth: Payer: Self-pay | Admitting: Family Medicine

## 2011-06-14 DIAGNOSIS — M545 Low back pain, unspecified: Secondary | ICD-10-CM

## 2011-06-14 NOTE — Telephone Encounter (Signed)
Call pt: Ck which is muscle enzyme is normal. No anemia or sign of infection.  CMP is normal. No sign of kidney or liver problems.  Thyorid and lactic acid are normal. Vit D is normal.  If not feeling any better then recommend get an xray of the low back this week.

## 2011-06-15 NOTE — Telephone Encounter (Signed)
Pt informed that she can go for her xray Mon-Friday 8-5pm at her convenience. Terri Newcomer, LPN [facility]

## 2011-06-15 NOTE — Telephone Encounter (Addendum)
Pt notified and wants to go ahead with the xray. Also pt states rx was sent for 30 day supply instead of 90 day supply. Called pharm and called in 90day supply

## 2011-06-15 NOTE — Telephone Encounter (Signed)
Order entered for x-ray. Patient can go anytime between 8 and 5.

## 2011-07-09 ENCOUNTER — Other Ambulatory Visit: Payer: Self-pay | Admitting: Family Medicine

## 2011-08-05 ENCOUNTER — Other Ambulatory Visit: Payer: Self-pay | Admitting: Family Medicine

## 2011-08-10 ENCOUNTER — Other Ambulatory Visit: Payer: Self-pay | Admitting: Family Medicine

## 2011-08-13 ENCOUNTER — Other Ambulatory Visit: Payer: Self-pay | Admitting: *Deleted

## 2011-08-13 MED ORDER — INSULIN GLARGINE 100 UNIT/ML ~~LOC~~ SOLN: 60.0000 [IU] | Freq: Every day | SUBCUTANEOUS | Status: DC

## 2011-08-31 ENCOUNTER — Other Ambulatory Visit: Payer: Self-pay | Admitting: *Deleted

## 2011-08-31 MED ORDER — INSULIN GLARGINE 100 UNIT/ML ~~LOC~~ SOLN: 60.0000 [IU] | Freq: Every day | SUBCUTANEOUS | Status: DC

## 2011-09-08 ENCOUNTER — Other Ambulatory Visit: Payer: Self-pay | Admitting: Family Medicine

## 2011-09-29 ENCOUNTER — Encounter: Payer: Self-pay | Admitting: Family Medicine

## 2011-09-29 ENCOUNTER — Ambulatory Visit (INDEPENDENT_AMBULATORY_CARE_PROVIDER_SITE_OTHER): Payer: BC Managed Care – PPO | Admitting: Family Medicine

## 2011-09-29 DIAGNOSIS — Z23 Encounter for immunization: Secondary | ICD-10-CM

## 2011-09-29 DIAGNOSIS — E119 Type 2 diabetes mellitus without complications: Secondary | ICD-10-CM

## 2011-09-29 LAB — POCT GLYCOSYLATED HEMOGLOBIN (HGB A1C): Hemoglobin A1C: 5.9

## 2011-09-29 LAB — POCT UA - MICROALBUMIN

## 2011-09-29 NOTE — Progress Notes (Signed)
  Subjective:    Patient ID: Terri Zimmerman, female    DOB: [birth year], 45 y.o.   MRN: [id number]  Diabetes She presents for her follow-up diabetic visit. She has type 2 diabetes mellitus. Her disease course has been stable. There are no hypoglycemic associated symptoms. (Occ dips low in the afternoon. ) Pertinent negatives for diabetes include no blurred vision, no chest pain, no polydipsia, no polyphagia, no polyuria and no weight loss. Current diabetic treatment includes oral agent (dual therapy) and insulin injections. She is compliant with treatment all of the time. She is following a generally healthy diet. She has not had a previous visit with a dietician. She participates in exercise three times a week. There is no change in her home blood glucose trend. An ACE inhibitor/angiotensin II receptor blocker is being taken. Eye exam is current.      Review of Systems  Constitutional: Negative for weight loss.  Eyes: Negative for blurred vision.  Cardiovascular: Negative for chest pain.  Genitourinary: Negative for polyuria.  Hematological: Negative for polydipsia and polyphagia.       Objective:   Physical Exam  Constitutional: She is oriented to person, place, and time. She appears well-developed and well-nourished.  HENT:  Head: Normocephalic and atraumatic.  Cardiovascular: Normal rate, regular rhythm and normal heart sounds.   Pulmonary/Chest: Effort normal and breath sounds normal.  Neurological: She is alert and oriented to person, place, and time. No cranial nerve deficit.  Skin: Skin is warm and dry.  Psychiatric: She has a normal mood and affect. Her behavior is normal.          Assessment & Plan:  DM- Looks fantastic today. Will dec lantus to 52 units. F/U in 3 months. Continue to work on exercise and healthy diet. Dicussed really needs to work on weight. Has plateaued since March. Consider weight watcher or counting calories. Flu shot give today. Microalbumin done today,  normal.  BP is borderline elevated today.  Lab Results  Component Value Date   HGBA1C 5.9 09/29/2011

## 2011-09-29 NOTE — Patient Instructions (Signed)
Continue to work on exercise and diet.  Decrease your insulin to 52 units. You were given a flu shot today.

## 2011-10-11 ENCOUNTER — Other Ambulatory Visit: Payer: Self-pay | Admitting: Family Medicine

## 2011-11-11 ENCOUNTER — Other Ambulatory Visit: Payer: Self-pay | Admitting: Family Medicine

## 2012-02-02 ENCOUNTER — Ambulatory Visit (INDEPENDENT_AMBULATORY_CARE_PROVIDER_SITE_OTHER): Payer: BC Managed Care – PPO | Admitting: Family Medicine

## 2012-02-02 ENCOUNTER — Encounter: Payer: Self-pay | Admitting: Family Medicine

## 2012-02-02 DIAGNOSIS — I10 Essential (primary) hypertension: Secondary | ICD-10-CM

## 2012-02-02 DIAGNOSIS — L309 Dermatitis, unspecified: Secondary | ICD-10-CM

## 2012-02-02 DIAGNOSIS — E119 Type 2 diabetes mellitus without complications: Secondary | ICD-10-CM

## 2012-02-02 DIAGNOSIS — L259 Unspecified contact dermatitis, unspecified cause: Secondary | ICD-10-CM

## 2012-02-02 NOTE — Progress Notes (Signed)
  Subjective:    Patient ID: Terri Zimmerman, female    DOB: 1966-09-02, 46 y.o.   MRN: [id number]  Diabetes She presents for her follow-up diabetic visit. She has type 2 diabetes mellitus. Her disease course has been stable. There are no hypoglycemic associated symptoms. There are no diabetic associated symptoms. Pertinent negatives for diabetes include no blurred vision, no chest pain, no foot ulcerations, no polydipsia, no polyphagia, no polyuria and no visual change. Symptoms are stable. Diabetic complications include peripheral neuropathy. Current diabetic treatment includes oral agent (dual therapy) and insulin injections. She is compliant with treatment all of the time. Her weight is stable. She is following a generally healthy diet. She rarely participates in exercise. An ACE inhibitor/angiotensin II receptor blocker is being taken. Eye exam is current.    Dry skin - Says plans on seeing dermatoligist. She has dry patches on the backs of her eyes and a sense of her knees. She's been trying to use Eucerin and has really helped her hands but still has the dry patches in other locations. She's also been using a lot of over-the-counter hydrocortisone cream per the pharmacist recommendations. She does have a prior history of skin cancer on her nose.  Review of Systems  Eyes: Negative for blurred vision.  Cardiovascular: Negative for chest pain.  Genitourinary: Negative for polyuria.  Hematological: Negative for polydipsia and polyphagia.       Objective:   Physical Exam  Constitutional: She is oriented to person, place, and time. She appears well-developed and well-nourished.  HENT:  Head: Normocephalic and atraumatic.  Cardiovascular: Normal rate, regular rhythm and normal heart sounds.   Pulmonary/Chest: Effort normal and breath sounds normal.  Neurological: She is alert and oriented to person, place, and time.  Skin: Skin is warm and dry.  Psychiatric: She has a normal mood and affect.  Her behavior is normal.          Assessment & Plan:  DM- did foot exam today. Due for lpids. At goal. F/U in 3 months. BP at goal.  Reminded her to get an eye exam. She also just saw her foot doctor today and had a steroid injection into her foot for a heel spur. I discussed that she may need to increase her Lantus for couple days and to monitor her sugars more closely. When she is able to restarting regular exercise and working on weight loss.  Coupon cared given for [employer].  Lab Results  Component Value Date   HGBA1C 6.4 02/02/2012   Eczema - Discussed skin care.  Given. H.O. Offered topicla steroid cream. Pt declined at this time. She does plan on following up with dermatology and in her features as she did so and discuss this with them as she has not had any significant improvement. I do recommend a yearly skin checks and she does have a prior history of skin cancer on her nose.  HTN - Rehceck BP. Recheck in normal

## 2012-02-02 NOTE — Patient Instructions (Addendum)
Remember to get your eye exam. Try Cerave and Aquaphor for eczema. Eczema Atopic dermatitis, or eczema, is an inherited type of sensitive skin. Often people with eczema have a family history of allergies, asthma, or hay fever. It causes a red itchy rash and dry scaly skin. The itchiness may occur before the skin rash and may be very intense. It is not contagious. Eczema is generally worse during the cooler winter months and often improves with the warmth of summer. Eczema usually starts showing signs in infancy. Some children outgrow eczema, but it may last through adulthood. Flare-ups may be caused by:  Eating something or contact with something you are sensitive or allergic to.   Stress.  DIAGNOSIS   The diagnosis of eczema is usually based upon symptoms and medical history. TREATMENT   Eczema cannot be cured, but symptoms usually can be controlled with treatment or avoidance of allergens (things to which you are sensitive or allergic to).  Controlling the itching and scratching.   Use over-the-counter antihistamines as directed for itching. It is especially useful at night when the itching tends to be worse.   Use over-the-counter steroid creams as directed for itching.   Scratching makes the rash and itching worse and may cause impetigo (a skin infection) if fingernails are contaminated (dirty).   Keeping the skin well moisturized with creams every day. This will seal in moisture and help prevent dryness. Lotions containing alcohol and water can dry the skin and are not recommended.   Limiting exposure to allergens.   Recognizing situations that cause stress.   Developing a plan to manage stress.  HOME CARE INSTRUCTIONS    Take prescription and over-the-counter medicines as directed by your caregiver.   Do not use anything on the skin without checking with your caregiver.   Keep baths or showers short (5 minutes) in warm (not hot) water. Use mild cleansers for bathing. You may  add non-perfumed bath oil to the bath water. It is best to avoid soap and bubble bath.   Immediately after a bath or shower, when the skin is still damp, apply a moisturizing ointment to the entire body. This ointment should be a petroleum ointment. This will seal in moisture and help prevent dryness. The thicker the ointment the better. These should be unscented.   Keep fingernails cut short and wash hands often. If your child has eczema, it may be necessary to put soft gloves or mittens on your child at night.   Dress in clothes made of cotton or cotton blends. Dress lightly, as heat increases itching.   Avoid foods that may cause flare-ups. Common foods include cow's milk, peanut butter, eggs and wheat.   Keep a child with eczema away from anyone with fever blisters. The virus that causes fever blisters (herpes simplex) can cause a serious skin infection in children with eczema.  SEEK MEDICAL CARE IF:    Itching interferes with sleep.   The rash gets worse or is not better within one week following treatment.   The rash looks infected (pus or soft yellow scabs).   You or your child has an oral temperature above 102 F (38.9 C).   Your baby is older than 3 months with a rectal temperature of 100.5 F (38.1 C) or higher for more than 1 day.   The rash flares up after contact with someone who has fever blisters.  SEEK IMMEDIATE MEDICAL CARE IF:    Your baby is older than 3 months with  a rectal temperature of 102 F (38.9 C) or higher.   Your baby is older than 3 months or younger with a rectal temperature of 100.4 F (38 C) or higher.  Document Released: 10/30/2000 Document Revised: 10/22/2011 Document Reviewed: 09/04/2009 [facility] Patient Information 2012 [place], [place].Smoking Cessation This document explains the best ways for you to quit smoking and new treatments to help. It lists new medicines that can double or triple your chances of quitting and quitting for good. It also  considers ways to avoid relapses and concerns you may have about quitting, including weight gain. NICOTINE: A POWERFUL ADDICTION If you have tried to quit smoking, you know how hard it can be. It is hard because nicotine is a very addictive drug. For some people, it can be as addictive as heroin or cocaine. Usually, people make 2 or 3 tries, or more, before finally being able to quit. Each time you try to quit, you can learn about what helps and what hurts. Quitting takes hard work and a lot of effort, but you can quit smoking. QUITTING SMOKING IS ONE OF THE MOST IMPORTANT THINGS YOU WILL EVER DO.  You will live longer, feel better, and live better.   The impact on your body of quitting smoking is felt almost immediately:   Within 20 minutes, blood pressure decreases. Pulse returns to its normal level.   After 8 hours, carbon monoxide levels in the blood return to normal. Oxygen level increases.   After 24 hours, chance of heart attack starts to decrease. Breath, hair, and body stop smelling like smoke.   After 48 hours, damaged nerve endings begin to recover. Sense of taste and smell improve.   After 72 hours, the body is virtually free of nicotine. Bronchial tubes relax and breathing becomes easier.   After 2 to 12 weeks, lungs can hold more air. Exercise becomes easier and circulation improves.   Quitting will reduce your risk of having a heart attack, stroke, cancer, or lung disease:   After 1 year, the risk of coronary heart disease is cut in half.   After 5 years, the risk of stroke falls to the same as a nonsmoker.   After 10 years, the risk of lung cancer is cut in half and the risk of other cancers decreases significantly.   After 15 years, the risk of coronary heart disease drops, usually to the level of a nonsmoker.   If you are pregnant, quitting smoking will improve your chances of having a healthy baby.   The people you live with, especially your children, will be  healthier.   You will have extra money to spend on things other than cigarettes.  FIVE KEYS TO QUITTING Studies have shown that these 5 steps will help you quit smoking and quit for good. You have the best chances of quitting if you use them together: 1. Get ready.  2. Get support and encouragement.  3. Learn new skills and behaviors.  4. Get medicine to reduce your nicotine addiction and use it correctly.  5. Be prepared for relapse or difficult situations. Be determined to continue trying to quit, even if you do not succeed at first.  1. GET READY  Set a quit date.   Change your environment.   Get rid of ALL cigarettes, ashtrays, matches, and lighters in your home, car, and place of work.   Do not let people smoke in your home.   Review your past attempts to quit. Think about what  worked and what did not.   Once you quit, do not smoke. NOT EVEN A PUFF!  2. GET SUPPORT AND ENCOURAGEMENT Studies have shown that you have a better chance of being successful if you have help. You can get support in many ways.  Tell your family, friends, and coworkers that you are going to quit and need their support. Ask them not to smoke around you.   Talk to your caregivers (doctor, dentist, nurse, pharmacist, psychologist, and/or smoking counselor).   Get individual, group, or telephone counseling and support. The more counseling you have, the better your chances are of quitting. Programs are available at [employer] and health centers. Call your local health department for information about programs in your area.   Spiritual beliefs and practices may help some smokers quit.   Quit meters are Photographer that keep track of quit statistics, such as amount of "quit-time," cigarettes not smoked, and money saved.   Many smokers find one or more of the many self-help books available useful in helping them quit and stay off tobacco.  3. LEARN NEW SKILLS AND  BEHAVIORS  Try to distract yourself from urges to smoke. Talk to someone, go for a walk, or occupy your time with a task.   When you first try to quit, change your routine. Take a different route to work. Drink tea instead of coffee. Eat breakfast in a different place.   Do something to reduce your stress. Take a hot bath, exercise, or read a book.   Plan something enjoyable to do every day. Reward yourself for not smoking.   Explore interactive web-based programs that specialize in helping you quit.  4. GET MEDICINE AND USE IT CORRECTLY Medicines can help you stop smoking and decrease the urge to smoke. Combining medicine with the above behavioral methods and support can quadruple your chances of successfully quitting smoking. The [place] Food and Drug Administration (FDA) has approved 7 medicines to help you quit smoking. These medicines fall into 3 categories.  Nicotine replacement therapy (delivers nicotine to your body without the negative effects and risks of smoking):   Nicotine gum: Available over-the-counter.   Nicotine lozenges: Available over-the-counter.   Nicotine inhaler: Available by prescription.   Nicotine nasal spray: Available by prescription.   Nicotine skin patches (transdermal): Available by prescription and over-the-counter.   Antidepressant medicine (helps people abstain from smoking, but how this works is unknown):   Bupropion sustained-release (SR) tablets: Available by prescription.   Nicotinic receptor partial agonist (simulates the effect of nicotine in your brain):   Varenicline tartrate tablets: Available by prescription.   Ask your caregiver for advice about which medicines to use and how to use them. Carefully read the information on the package.   Everyone who is trying to quit may benefit from using a medicine. If you are pregnant or trying to become pregnant, nursing an infant, you are under age 88, or you smoke fewer than 10 cigarettes per day,  talk to your caregiver before taking any nicotine replacement medicines.   You should stop using a nicotine replacement product and call your caregiver if you experience nausea, dizziness, weakness, vomiting, fast or irregular heartbeat, mouth problems with the lozenge or gum, or redness or swelling of the skin around the patch that does not go away.   Do not use any other product containing nicotine while using a nicotine replacement product.   Talk to your caregiver before using these products if  you have diabetes, heart disease, asthma, stomach ulcers, you had a recent heart attack, you have high blood pressure that is not controlled with medicine, a history of irregular heartbeat, or you have been prescribed medicine to help you quit smoking.  5. BE PREPARED FOR RELAPSE OR DIFFICULT SITUATIONS  Most relapses occur within the first 3 months after quitting. Do not be discouraged if you start smoking again. Remember, most people try several times before they finally quit.   You may have symptoms of withdrawal because your body is used to nicotine. You may crave cigarettes, be irritable, feel very hungry, cough often, get headaches, or have difficulty concentrating.   The withdrawal symptoms are only temporary. They are strongest when you first quit, but they will go away within 10 to 14 days.  Here are some difficult situations to watch for:  Alcohol. Avoid drinking alcohol. Drinking lowers your chances of successfully quitting.   Caffeine. Try to reduce the amount of caffeine you consume. It also lowers your chances of successfully quitting.   Other smokers. Being around smoking can make you want to smoke. Avoid smokers.   Weight gain. Many smokers will gain weight when they quit, usually less than 10 pounds. Eat a healthy diet and stay active. Do not let weight gain distract you from your main goal, quitting smoking. Some medicines that help you quit smoking may also help delay weight gain.  You can always lose the weight gained after you quit.   Bad mood or depression. There are a lot of ways to improve your mood other than smoking.  If you are having problems with any of these situations, talk to your caregiver. SPECIAL SITUATIONS AND CONDITIONS Studies suggest that everyone can quit smoking. Your situation or condition can give you a special reason to quit.  Pregnant women/new mothers: By quitting, you protect your baby's health and your own.   Hospitalized patients: By quitting, you reduce health problems and help healing.   Heart attack patients: By quitting, you reduce your risk of a second heart attack.   Lung, head, and neck cancer patients: By quitting, you reduce your chance of a second cancer.   Parents of children and adolescents: By quitting, you protect your children from illnesses caused by secondhand smoke.  QUESTIONS TO THINK ABOUT Think about the following questions before you try to stop smoking. You may want to talk about your answers with your caregiver.  Why do you want to quit?   If you tried to quit in the past, what helped and what did not?   What will be the most difficult situations for you after you quit? How will you plan to handle them?   Who can help you through the tough times? Your family? Friends? Caregiver?   What pleasures do you get from smoking? What ways can you still get pleasure if you quit?  Here are some questions to ask your caregiver:  How can you help me to be successful at quitting?   What medicine do you think would be best for me and how should I take it?   What should I do if I need more help?   What is smoking withdrawal like? How can I get information on withdrawal?  Quitting takes hard work and a lot of effort, but you can quit smoking. FOR MORE INFORMATION   Smokefree.gov (http://www.davis-sullivan.com/) provides free, accurate, evidence-based information and professional assistance to help support the immediate and  long-term needs of people trying  to quit smoking. Document Released: 10/27/2001 Document Revised: 10/22/2011 Document Reviewed: 08/19/2009 [facility] Patient Information 2012 [place], [place].

## 2012-02-03 ENCOUNTER — Other Ambulatory Visit: Payer: Self-pay | Admitting: Family Medicine

## 2012-02-12 LAB — COMPLETE METABOLIC PANEL WITH GFR
ALT: 24 U/L (ref 0–35)
CO2: 28 mEq/L (ref 19–32)
Calcium: 9.2 mg/dL (ref 8.4–10.5)
Chloride: 107 mEq/L (ref 96–112)
GFR, Est African American: >89 mL/min
Potassium: 4.6 mEq/L (ref 3.5–5.3)
Sodium: 141 mEq/L (ref 135–145)
Total Bilirubin: 0.4 mg/dL (ref 0.3–1.2)
Total Protein: 6.5 g/dL (ref 6.0–8.3)

## 2012-02-12 LAB — LIPID PANEL: LDL Cholesterol: 92 mg/dL (ref 0–99)

## 2012-02-29 ENCOUNTER — Other Ambulatory Visit: Payer: Self-pay | Admitting: Family Medicine

## 2012-05-07 ENCOUNTER — Encounter (HOSPITAL_BASED_OUTPATIENT_CLINIC_OR_DEPARTMENT_OTHER): Payer: Self-pay | Admitting: *Deleted

## 2012-05-07 ENCOUNTER — Emergency Department (HOSPITAL_BASED_OUTPATIENT_CLINIC_OR_DEPARTMENT_OTHER): Payer: BC Managed Care – PPO

## 2012-05-07 ENCOUNTER — Emergency Department: Admission: EM | Admit: 2012-05-07 | Discharge: 2012-05-07 | Payer: Self-pay

## 2012-05-07 ENCOUNTER — Emergency Department (HOSPITAL_BASED_OUTPATIENT_CLINIC_OR_DEPARTMENT_OTHER)
Admission: EM | Admit: 2012-05-07 | Discharge: 2012-05-07 | Disposition: A | Discharge status: Home / Self Care | Payer: BC Managed Care – PPO | Attending: Emergency Medicine | Admitting: Emergency Medicine

## 2012-05-07 DIAGNOSIS — J45909 Unspecified asthma, uncomplicated: Secondary | ICD-10-CM | POA: Insufficient documentation

## 2012-05-07 DIAGNOSIS — Z794 Long term (current) use of insulin: Secondary | ICD-10-CM | POA: Insufficient documentation

## 2012-05-07 DIAGNOSIS — F17200 Nicotine dependence, unspecified, uncomplicated: Secondary | ICD-10-CM | POA: Insufficient documentation

## 2012-05-07 DIAGNOSIS — E119 Type 2 diabetes mellitus without complications: Secondary | ICD-10-CM | POA: Insufficient documentation

## 2012-05-07 DIAGNOSIS — I10 Essential (primary) hypertension: Secondary | ICD-10-CM | POA: Insufficient documentation

## 2012-05-07 DIAGNOSIS — Z79899 Other long term (current) drug therapy: Secondary | ICD-10-CM | POA: Insufficient documentation

## 2012-05-07 DIAGNOSIS — M25569 Pain in unspecified knee: Secondary | ICD-10-CM | POA: Insufficient documentation

## 2012-05-07 MED ORDER — HYDROCODONE-ACETAMINOPHEN 5-500 MG PO TABS: 1.0000 | ORAL_TABLET | Freq: Four times a day (QID) | ORAL | Status: AC | PRN

## 2012-05-07 NOTE — Discharge Instructions (Signed)
Knee Pain The knee is the complex joint between your thigh and your lower leg. It is made up of bones, tendons, ligaments, and cartilage. The bones that make up the knee are:  The femur in the thigh.   The tibia and fibula in the lower leg.   The patella or kneecap riding in the groove on the lower femur.  CAUSES  Knee pain is a common complaint with many causes. A few of these causes are:  Injury, such as:   A ruptured ligament or tendon injury.   Torn cartilage.   Medical conditions, such as:   Gout   Arthritis   Infections   Overuse, over training or overdoing a physical activity.  Knee pain can be minor or severe. Knee pain can accompany debilitating injury. Minor knee problems often respond well to self-care measures or get well on their own. More serious injuries may need medical intervention or even surgery. SYMPTOMS The knee is complex. Symptoms of knee problems can vary widely. Some of the problems are:  Pain with movement and weight bearing.   Swelling and tenderness.   Buckling of the knee.   Inability to straighten or extend your knee.   Your knee locks and you cannot straighten it.   Warmth and redness with pain and fever.   Deformity or dislocation of the kneecap.  DIAGNOSIS  Determining what is wrong may be very straight forward such as when there is an injury. It can also be challenging because of the complexity of the knee. Tests to make a diagnosis may include:  Your caregiver taking a history and doing a physical exam.   Routine X-rays can be used to rule out other problems. X-rays will not reveal a cartilage tear. Some injuries of the knee can be diagnosed by:   Arthroscopy a surgical technique by which a small video camera is inserted through tiny incisions on the sides of the knee. This procedure is used to examine and repair internal knee joint problems. Tiny instruments can be used during arthroscopy to repair the torn knee cartilage  (meniscus).   Arthrography is a radiology technique. A contrast liquid is directly injected into the knee joint. Internal structures of the knee joint then become visible on X-ray film.   An MRI scan is a non x-ray radiology procedure in which magnetic fields and a computer produce two- or three-dimensional images of the inside of the knee. Cartilage tears are often visible using an MRI scanner. MRI scans have largely replaced arthrography in diagnosing cartilage tears of the knee.   Blood work.   Examination of the fluid that helps to lubricate the knee joint (synovial fluid). This is done by taking a sample out using a needle and a syringe.  TREATMENT The treatment of knee problems depends on the cause. Some of these treatments are:  Depending on the injury, proper casting, splinting, surgery or physical therapy care will be needed.   Give yourself adequate recovery time. Do not overuse your joints. If you begin to get sore during workout routines, back off. Slow down or do fewer repetitions.   For repetitive activities such as cycling or running, maintain your strength and nutrition.   Alternate muscle groups. For example if you are a weight lifter, work the upper body on one day and the lower body the next.   Either tight or weak muscles do not give the proper support for your knee. Tight or weak muscles do not absorb the stress placed   on the knee joint. Keep the muscles surrounding the knee strong.   Take care of mechanical problems.   If you have flat feet, orthotics or special shoes may help. See your caregiver if you need help.   Arch supports, sometimes with wedges on the inner or outer aspect of the heel, can help. These can shift pressure away from the side of the knee most bothered by osteoarthritis.   A brace called an "unloader" brace also may be used to help ease the pressure on the most arthritic side of the knee.   If your caregiver has prescribed crutches, braces,  wraps or ice, use as directed. The acronym for this is PRICE. This means protection, rest, ice, compression and elevation.   Nonsteroidal anti-inflammatory drugs (NSAID's), can help relieve pain. But if taken immediately after an injury, they may actually increase swelling. Take NSAID's with food in your stomach. Stop them if you develop stomach problems. Do not take these if you have a history of ulcers, stomach pain or bleeding from the bowel. Do not take without your caregiver's approval if you have problems with fluid retention, heart failure, or kidney problems.   For ongoing knee problems, physical therapy may be helpful.   Glucosamine and chondroitin are over-the-counter dietary supplements. Both may help relieve the pain of osteoarthritis in the knee. These medicines are different from the usual anti-inflammatory drugs. Glucosamine may decrease the rate of cartilage destruction.   Injections of a corticosteroid drug into your knee joint may help reduce the symptoms of an arthritis flare-up. They may provide pain relief that lasts a few months. You may have to wait a few months between injections. The injections do have a small increased risk of infection, water retention and elevated blood sugar levels.   Hyaluronic acid injected into damaged joints may ease pain and provide lubrication. These injections may work by reducing inflammation. A series of shots may give relief for as long as 6 months.   Topical painkillers. Applying certain ointments to your skin may help relieve the pain and stiffness of osteoarthritis. Ask your pharmacist for suggestions. Many over the-counter products are approved for temporary relief of arthritis pain.   In some countries, doctors often prescribe topical NSAID's for relief of chronic conditions such as arthritis and tendinitis. A review of treatment with NSAID creams found that they worked as well as oral medications but without the serious side effects.    PREVENTION  Maintain a healthy weight. Extra pounds put more strain on your joints.   Get strong, stay limber. Weak muscles are a common cause of knee injuries. Stretching is important. Include flexibility exercises in your workouts.   Be smart about exercise. If you have osteoarthritis, chronic knee pain or recurring injuries, you may need to change the way you exercise. This does not mean you have to stop being active. If your knees ache after jogging or playing basketball, consider switching to swimming, water aerobics or other low-impact activities, at least for a few days a week. Sometimes limiting high-impact activities will provide relief.   Make sure your shoes fit well. Choose footwear that is right for your sport.   Protect your knees. Use the proper gear for knee-sensitive activities. Use kneepads when playing volleyball or laying carpet. Buckle your seat belt every time you drive. Most shattered kneecaps occur in car accidents.   Rest when you are tired.  SEEK MEDICAL CARE IF:  You have knee pain that is continual and does not   seem to be getting better.  SEEK IMMEDIATE MEDICAL CARE IF:  Your knee joint feels hot to the touch and you have a high fever. MAKE SURE YOU:   Understand these instructions.   Will watch your condition.   Will get help right away if you are not doing well or get worse.  Document Released: 08/30/2007 Document Revised: 10/22/2011 Document Reviewed: 08/30/2007 [employer] Patient Information 2012 [employer]. 

## 2012-05-07 NOTE — ED Provider Notes (Signed)
Medical screening examination/treatment/procedure(s) were performed by non-physician practitioner and as supervising physician I was immediately available for consultation/collaboration.   Terri Pozo B. Bernette Mayers, MD 05/07/12 [id number]

## 2012-05-07 NOTE — ED Notes (Signed)
On arrival to patients room to place knee sleeve, patient initially declines this treatment, stating she had one similar at home and it bunched up and made her knee feel worse. Patient inquires about other alternatives. Discussed with patient possible alternatives to the knee sleeve and discussed possibly having a knee sleeve that was not an appropriate size. Patient agrees to wear the knee sleeve and states it feels better than the one she has at home.

## 2012-05-07 NOTE — ED Provider Notes (Signed)
History     CSN: [id number]  Arrival date & time 05/07/12  1423   First MD Initiated Contact with Patient 05/07/12 1439      Chief Complaint  Patient presents with  . Knee Pain    (Consider location/radiation/quality/duration/timing/severity/associated sxs/prior treatment) HPI Comments: Pt states that she has been having intermittent right knee pain and swelling:no known injury:pt states that she has been wearing a boot for her left foot  Patient is a 46 y.o. female presenting with knee pain. The history is provided by the patient.  Knee Pain This is a new problem. The current episode started 1 to 4 weeks ago. The problem occurs constantly. The problem has been unchanged. The symptoms are aggravated by bending. She has tried NSAIDs for the symptoms. The treatment provided mild relief.    Past Medical History  Diagnosis Date  . Hypertension 11-04-06  . Asthma 04-25-07  . Diabetes mellitus 11-12-06    type 2    Past Surgical History  Procedure Date  . Breast surgery 4-00    of cyst  . Appendectomy 9-00  . Laparoscopy 4-08    for cyst and endometriosis  . Left breast bx 12-11  . Abdominal hysterectomy 12-11    Complete    Family History  Problem Relation Age of Onset  . Other Mother     RA and CHF  . Diabetes Father   . Diabetes Other   . Heart disease Other     History  Substance Use Topics  . Smoking status: Current Everyday Smoker  . Smokeless tobacco: Not on file  . Alcohol Use: No    OB History    Grav Para Term Preterm Abortions TAB SAB Ect Mult Living                  Review of Systems  Constitutional: Negative.   Respiratory: Negative.   Cardiovascular: Negative.   Neurological: Negative.     Allergies  Review of patient's allergies indicates no known allergies.  Home Medications   Current Outpatient Rx  Name Route Sig Dispense Refill  . BD PEN NEEDLE SHORT U/F 31G X 8 MM MISC  use as directed 100 each PRN  . ESTRADIOL 1 MG PO TABS  Oral Take 1 mg by mouth daily.      . INSULIN GLARGINE 100 UNIT/ML [place] SOLN Subcutaneous Inject 58 Units into the skin daily.     . INSULIN PEN NEEDLE 30G X 8 MM MISC Subcutaneous Inject into the skin as directed. For Lantus pen     . KOMBIGLYZE XR 03-999 MG PO TB24  TAKE 1 TABLET BY MOUTH EVERY DAY 30 tablet 2  . METANX PO Oral Take by mouth.    Marland Kitchen LIFESCAN UNISTIK II LANCETS MISC Does not apply by Does not apply route as directed.      Marland Kitchen LISINOPRIL 20 MG PO TABS  TAKE 1 TABLET BY MOUTH DAILY 30 tablet 3  . NAPROXEN SODIUM 220 MG PO TABS Oral Take 220 mg by mouth 2 (two) times daily with a meal. Patient used this medication for pain.    . NON FORMULARY  One Touch Ultra View test strips- 2 bottles     . NON FORMULARY  Glucometer, strips, and lancets to test daily DX:250.00       BP 152/90  Pulse 102  Temp 98 F (36.7 C) (Oral)  Resp 20  Ht 5\' 1"  (1.549 m)  Wt 188 lb (85.276 kg)  BMI  35.52 kg/m2  SpO2 100%  Physical Exam  Nursing note and vitals reviewed. Constitutional: She is oriented to person, place, and time. She appears well-developed and well-nourished.  Cardiovascular: Normal rate and regular rhythm.   Pulmonary/Chest: Effort normal and breath sounds normal.  Musculoskeletal:       Mild swelling noted to the right knee:pt has full WUJ:WJXBJY intact  Neurological: She is alert and oriented to person, place, and time.  Skin: Skin is warm and dry.  Psychiatric: She has a normal mood and affect.    ED Course  Procedures (including critical care time)  Labs Reviewed - No data to display Dg Knee Complete 4 Views Right  05/07/2012  *RADIOLOGY REPORT*  Clinical Data: Right knee pain and swelling.Meniscal tear.  RIGHT KNEE - COMPLETE 4+ VIEW  Comparison: 08/19/2010 radiographs and 08/30/2010 MRI  Findings: No evidence of acute fracture, subluxation or dislocation identified.  No joint effusion noted.  No radio-opaque foreign bodies are present.  No focal bony lesions are noted.   The joint spaces are unremarkable.  IMPRESSION: Unremarkable right knee.  Original Report Authenticated By: Rosendo Gros, M.D.     1. Knee pain       MDM  Pt is okay to follow up as needed with her orthopedist:pt given a knee sleeve for comfort:pt given vicodin for pain:pt concerned about a clot:no redness swelling or warmth noted and swelling is localized to knee        Teressa Lower, NP 05/07/12 1616  Teressa Lower, NP 05/07/12 [phone]

## 2012-05-07 NOTE — ED Notes (Signed)
Pt states she is scheduled for surgery on her left foot July 1. Thinks she may have been compensating with her right knee. C/O pain to same.

## 2012-05-09 ENCOUNTER — Other Ambulatory Visit: Payer: Self-pay | Admitting: Family Medicine

## 2012-05-11 ENCOUNTER — Other Ambulatory Visit: Payer: Self-pay | Admitting: Family Medicine

## 2012-05-16 HISTORY — PX: FOOT TENDON SURGERY: SHX958

## 2012-05-31 ENCOUNTER — Ambulatory Visit: Payer: BC Managed Care – PPO | Attending: Podiatry | Admitting: Physical Therapy

## 2012-05-31 DIAGNOSIS — R269 Unspecified abnormalities of gait and mobility: Secondary | ICD-10-CM | POA: Insufficient documentation

## 2012-05-31 DIAGNOSIS — M25579 Pain in unspecified ankle and joints of unspecified foot: Secondary | ICD-10-CM | POA: Insufficient documentation

## 2012-05-31 DIAGNOSIS — M6281 Muscle weakness (generalized): Secondary | ICD-10-CM | POA: Insufficient documentation

## 2012-05-31 DIAGNOSIS — IMO0001 Reserved for inherently not codable concepts without codable children: Secondary | ICD-10-CM | POA: Insufficient documentation

## 2012-06-03 ENCOUNTER — Ambulatory Visit: Payer: BC Managed Care – PPO | Admitting: Physical Therapy

## 2012-06-06 ENCOUNTER — Encounter: Payer: BC Managed Care – PPO | Admitting: Physical Therapy

## 2012-06-06 ENCOUNTER — Ambulatory Visit: Payer: BC Managed Care – PPO | Admitting: Physical Therapy

## 2012-06-10 ENCOUNTER — Ambulatory Visit: Payer: BC Managed Care – PPO | Admitting: Physical Therapy

## 2012-06-13 ENCOUNTER — Ambulatory Visit: Payer: BC Managed Care – PPO | Admitting: Physical Therapy

## 2012-06-15 ENCOUNTER — Ambulatory Visit: Payer: BC Managed Care – PPO | Admitting: Physical Therapy

## 2012-06-20 ENCOUNTER — Ambulatory Visit: Payer: BC Managed Care – PPO | Attending: Podiatry | Admitting: Physical Therapy

## 2012-06-20 DIAGNOSIS — M6281 Muscle weakness (generalized): Secondary | ICD-10-CM | POA: Insufficient documentation

## 2012-06-20 DIAGNOSIS — IMO0001 Reserved for inherently not codable concepts without codable children: Secondary | ICD-10-CM | POA: Insufficient documentation

## 2012-06-20 DIAGNOSIS — R269 Unspecified abnormalities of gait and mobility: Secondary | ICD-10-CM | POA: Insufficient documentation

## 2012-06-20 DIAGNOSIS — M25579 Pain in unspecified ankle and joints of unspecified foot: Secondary | ICD-10-CM | POA: Insufficient documentation

## 2012-06-23 ENCOUNTER — Ambulatory Visit: Payer: BC Managed Care – PPO | Admitting: Physical Therapy

## 2012-06-25 ENCOUNTER — Other Ambulatory Visit: Payer: Self-pay | Admitting: Family Medicine

## 2012-06-27 ENCOUNTER — Encounter: Payer: Self-pay | Admitting: Family Medicine

## 2012-06-27 ENCOUNTER — Ambulatory Visit (INDEPENDENT_AMBULATORY_CARE_PROVIDER_SITE_OTHER): Payer: BC Managed Care – PPO | Admitting: Family Medicine

## 2012-06-27 VITALS — BP 118/74 | HR 150 | Ht 61.5 in | Wt 190.0 lb

## 2012-06-27 DIAGNOSIS — E119 Type 2 diabetes mellitus without complications: Secondary | ICD-10-CM

## 2012-06-27 DIAGNOSIS — I10 Essential (primary) hypertension: Secondary | ICD-10-CM

## 2012-06-27 LAB — POCT UA - MICROALBUMIN

## 2012-06-27 LAB — POCT GLYCOSYLATED HEMOGLOBIN (HGB A1C): Hemoglobin A1C: 5.9

## 2012-06-27 NOTE — Progress Notes (Signed)
  Subjective:    Patient ID: Terri Zimmerman, female    DOB: 1966/08/03, 46 y.o.   MRN: [id number]  HPI DM - No real lows. Had foot surgery and just released back to work last week. Says hasn't been able to be physically active.  No wounds. She healed well form surgery.  On lantus 58 unitts.   HTN- No CP or SOB.  Taking meds regularly.   Review of Systems     Objective:   Physical Exam  Constitutional: She is oriented to person, place, and time. She appears well-developed and well-nourished.  HENT:  Head: Normocephalic and atraumatic.  Cardiovascular: Normal rate, regular rhythm and normal heart sounds.   Pulmonary/Chest: Effort normal and breath sounds normal.  Neurological: She is alert and oriented to person, place, and time.  Skin: Skin is warm and dry.  Psychiatric: She has a normal mood and affect. Her behavior is normal.          Assessment & Plan:  DM- doing well on Lantus and metformin.  Will dec lantus to 55 units down from 58. A1C looks great.  I'm hoping when she is released from her foot surgeon that she'll be able to start exercising regularly and be able to lose weight. This will help a great deal with her blood glucose control. We would then potentially being to decrease her Lantus which would be great. Followup in 3-4 months. Eye appt is already schedule  HTN - Well controlled today. At goal. F/U in 6 months.

## 2012-06-27 NOTE — Addendum Note (Signed)
Addended by: Wyline Beady on: 06/27/2012 02:23 PM   Modules accepted: Orders

## 2012-06-28 LAB — BASIC METABOLIC PANEL WITH GFR
BUN: 6 mg/dL (ref 6–23)
CO2: 28 mEq/L (ref 19–32)
Chloride: 105 mEq/L (ref 96–112)
Creat: 0.62 mg/dL (ref 0.50–1.10)
Glucose, Bld: 114 mg/dL — ABNORMAL HIGH (ref 70–99)

## 2012-06-29 ENCOUNTER — Encounter: Payer: Self-pay | Admitting: *Deleted

## 2012-06-30 ENCOUNTER — Ambulatory Visit: Payer: BC Managed Care – PPO | Admitting: Physical Therapy

## 2012-07-05 ENCOUNTER — Ambulatory Visit: Payer: BC Managed Care – PPO | Admitting: Physical Therapy

## 2012-07-07 ENCOUNTER — Ambulatory Visit: Payer: BC Managed Care – PPO | Admitting: Physical Therapy

## 2012-07-14 ENCOUNTER — Ambulatory Visit: Payer: BC Managed Care – PPO | Admitting: Physical Therapy

## 2012-07-21 ENCOUNTER — Ambulatory Visit: Payer: BC Managed Care – PPO | Attending: Podiatry | Admitting: Physical Therapy

## 2012-07-21 DIAGNOSIS — M6281 Muscle weakness (generalized): Secondary | ICD-10-CM | POA: Insufficient documentation

## 2012-07-21 DIAGNOSIS — IMO0001 Reserved for inherently not codable concepts without codable children: Secondary | ICD-10-CM | POA: Insufficient documentation

## 2012-07-21 DIAGNOSIS — R269 Unspecified abnormalities of gait and mobility: Secondary | ICD-10-CM | POA: Insufficient documentation

## 2012-07-21 DIAGNOSIS — M25579 Pain in unspecified ankle and joints of unspecified foot: Secondary | ICD-10-CM | POA: Insufficient documentation

## 2012-07-28 ENCOUNTER — Ambulatory Visit: Payer: BC Managed Care – PPO | Admitting: Physical Therapy

## 2012-08-04 ENCOUNTER — Ambulatory Visit: Payer: BC Managed Care – PPO | Admitting: Physical Therapy

## 2012-08-04 ENCOUNTER — Other Ambulatory Visit: Payer: Self-pay | Admitting: Family Medicine

## 2012-08-09 ENCOUNTER — Ambulatory Visit (INDEPENDENT_AMBULATORY_CARE_PROVIDER_SITE_OTHER): Payer: BC Managed Care – PPO | Admitting: Family Medicine

## 2012-08-09 DIAGNOSIS — Z23 Encounter for immunization: Secondary | ICD-10-CM

## 2012-08-09 NOTE — Progress Notes (Signed)
Here for flu shot

## 2012-08-13 ENCOUNTER — Other Ambulatory Visit: Payer: Self-pay | Admitting: Family Medicine

## 2012-09-02 ENCOUNTER — Other Ambulatory Visit: Payer: Self-pay | Admitting: Family Medicine

## 2012-10-18 ENCOUNTER — Other Ambulatory Visit: Payer: Self-pay | Admitting: Family Medicine

## 2012-10-31 ENCOUNTER — Ambulatory Visit (INDEPENDENT_AMBULATORY_CARE_PROVIDER_SITE_OTHER): Payer: BC Managed Care – PPO | Admitting: Family Medicine

## 2012-10-31 ENCOUNTER — Encounter: Payer: Self-pay | Admitting: Family Medicine

## 2012-10-31 VITALS — BP 128/79 | HR 105 | Ht 61.0 in | Wt 188.0 lb

## 2012-10-31 DIAGNOSIS — R350 Frequency of micturition: Secondary | ICD-10-CM

## 2012-10-31 DIAGNOSIS — E119 Type 2 diabetes mellitus without complications: Secondary | ICD-10-CM

## 2012-10-31 DIAGNOSIS — R110 Nausea: Secondary | ICD-10-CM

## 2012-10-31 DIAGNOSIS — R1011 Right upper quadrant pain: Secondary | ICD-10-CM

## 2012-10-31 LAB — POCT URINALYSIS DIPSTICK
Ketones, UA: NEGATIVE
Protein, UA: NEGATIVE
Spec Grav, UA: 1.015
pH, UA: 6

## 2012-10-31 LAB — POCT GLYCOSYLATED HEMOGLOBIN (HGB A1C): Hemoglobin A1C: 6

## 2012-10-31 NOTE — Patient Instructions (Signed)
They will call you for the Ultrasound.

## 2012-10-31 NOTE — Progress Notes (Signed)
Subjective:    Patient ID: Terri Zimmerman, female    DOB: Nov 08, 1966, 46 y.o.   MRN: [id number]  HPI DM - has felt well. No cuts or sores that aren't healing well. No hypoglycemic events. On lantus 58 units daily.  Has lost 2 lbs.  She feels really well overall. She's taking her medications consistently. She's been trying to watch which she eats.  She feels like a fullness in the RUQ.  Occ feels sore. She says she just feels weird on the right side of her body. Occ fels sore into her right flank.  Has a constant pain over her right shoulder bade.  Occ aching in the right neck.  Occ gets nauseated in the AM.  Then gets hungry at the same time.  Still has GB.  Also having some urinary urgency, that started about 2 months ago. Denies pain just says it is uncomfortable.  No fever.  No SOB or chest pain.  Says spicey foods make it worse. Not taking naproxen daily since had her foot surgery. No constipation. Occ loose stools but this is normal for her.    Review of Systems No chest pain, diaphoresis, or dizziness. No pain radiating down into her arm. No numbness or tingling. No changes in bowel movements.  BP 128/79  Pulse 105  Ht 5\' 1"  (1.549 m)  Wt 188 lb (85.276 kg)  BMI 35.52 kg/m2    No Known Allergies  Past Medical History  Diagnosis Date  . Hypertension 11-04-06  . Asthma 04-25-07  . Diabetes mellitus 11-12-06    type 2    Past Surgical History  Procedure Date  . Breast surgery 4-00    of cyst  . Appendectomy 9-00  . Laparoscopy 4-08    for cyst and endometriosis  . Left breast bx 12-11  . Abdominal hysterectomy 12-11    Complete  . Foot tendon surgery 05/16/2012    bone spur.      History   Social History  . Marital Status: Married    Spouse Name: N/A    Number of Children: N/A  . Years of Education: N/A   Occupational History  . Not on file.   Social History Main Topics  . Smoking status: Current Every Day Smoker  . Smokeless tobacco: Not on file  . Alcohol Use:  No  . Drug Use: No  . Sexually Active: Not on file   Other Topics Concern  . Not on file   Social History Narrative  . No narrative on file    Family History  Problem Relation Age of Onset  . Other Mother     RA and CHF  . Diabetes Father   . Diabetes Other   . Heart disease Other     Outpatient Encounter Prescriptions as of 10/31/2012  Medication Sig Dispense Refill  . B-D ULTRAFINE III SHORT PEN 31G X 8 MM MISC USE AS DIRECTED  100 each  4  . estradiol (ESTRACE) 1 MG tablet Take 1 mg by mouth daily.        . Insulin Pen Needle (NOVOFINE) 30G X 8 MM MISC Inject into the skin as directed. For Lantus pen       . KOMBIGLYZE XR 03-999 MG TB24 TAKE 1 TABLET BY MOUTH EVERY DAY  30 tablet  2  . LANTUS SOLOSTAR 100 UNIT/ML injection INJECT 60 UNITS SUBCUTANEOUSLY ONCE DAILY  45 Syringe  2  . LifeScan Unistik II Lancets MISC by Does not apply  route as directed.        Marland Kitchen lisinopril (PRINIVIL,ZESTRIL) 20 MG tablet TAKE 1 TABLET BY MOUTH DAILY  30 tablet  3  . naproxen sodium (ANAPROX) 220 MG tablet Take 220 mg by mouth 2 (two) times daily with a meal. Patient used this medication for pain.      . NON FORMULARY One Touch Ultra View test strips- 2 bottles       . NON FORMULARY Glucometer, strips, and lancets to test daily DX:250.00       . L-Methylfolate-B6-B12 (METANX PO) Take by mouth.              Objective:   Physical Exam  Constitutional: She is oriented to person, place, and time. She appears well-developed and well-nourished.  HENT:  Head: Normocephalic and atraumatic.  Neck: Neck supple. No thyromegaly present.  Cardiovascular: Normal rate, regular rhythm and normal heart sounds.   Pulmonary/Chest: Effort normal and breath sounds normal.  Abdominal: Soft. Bowel sounds are normal. She exhibits no distension and no mass. There is tenderness. There is no rebound and no guarding.       Tender in the RUQ with deep palpation  Musculoskeletal: She exhibits no edema.       Neck  with normal range of motion. Bilateral shoulders with normal range of motion. She's nontender over the shoulder. She is slightly tender between the right scapula and the thoracic spine. No CVA tenderness. No lumbar spine tenderness.   Lymphadenopathy:    She has no cervical adenopathy.  Neurological: She is alert and oriented to person, place, and time.  Skin: Skin is warm and dry.  Psychiatric: She has a normal mood and affect. Her behavior is normal.          Assessment & Plan:  DM- well controlled.  Continue current regimen. Followup in 4 months. Lab Results  Component Value Date   HGBA1C 5.9 06/27/2012   RUQ pain - I am very suspicious of her gallbladder is based on her symptoms and affect she is tender in the right upper cord her. We will check liver enzymes and a CBC today. Will schedule for right upper quadrant ultrasound. Avoid greasy and spicy foods until then.  Nausea-mostly occurs in the morning. I encouraged her to at least check her blood sugar when this happens. She is on insulin and says she often doesn't eat until 10:00 in the morning.  It could be a low sugar or it could be related to the right upper quadrant pain she is experiencing.  Right shoulder blade pain-she is tender over the muscles themselves. Suspect MSK skeletal strain. Though this certainly could be related to her gallbladder as well as sometimes pain is referred.  Urinary frequency - Will check UA today.  + LEuk, will send for culture.

## 2012-11-01 ENCOUNTER — Other Ambulatory Visit: Payer: Self-pay | Admitting: *Deleted

## 2012-11-01 LAB — CBC WITH DIFFERENTIAL/PLATELET
Basophils Relative: 0 % (ref 0–1)
Eosinophils Absolute: 0.1 10*3/uL (ref 0.0–0.7)
HCT: 41.2 % (ref 36.0–46.0)
Hemoglobin: 13.7 g/dL (ref 12.0–15.0)
MCH: 27.9 pg (ref 26.0–34.0)
MCHC: 33.3 g/dL (ref 30.0–36.0)
Monocytes Absolute: 0.6 10*3/uL (ref 0.1–1.0)
Monocytes Relative: 5 % (ref 3–12)
Neutrophils Relative %: 66 % (ref 43–77)

## 2012-11-01 LAB — COMPLETE METABOLIC PANEL WITH GFR
ALT: 21 U/L (ref 0–35)
Albumin: 4.2 g/dL (ref 3.5–5.2)
CO2: 30 mEq/L (ref 19–32)
GFR, Est African American: >89 mL/min
Potassium: 4.1 mEq/L (ref 3.5–5.3)
Sodium: 142 mEq/L (ref 135–145)
Total Bilirubin: 0.3 mg/dL (ref 0.3–1.2)
Total Protein: 6.4 g/dL (ref 6.0–8.3)

## 2012-11-01 MED ORDER — AMBULATORY NON FORMULARY MEDICATION: Status: DC

## 2012-11-02 ENCOUNTER — Ambulatory Visit (INDEPENDENT_AMBULATORY_CARE_PROVIDER_SITE_OTHER): Payer: BC Managed Care – PPO

## 2012-11-02 ENCOUNTER — Other Ambulatory Visit: Payer: Self-pay | Admitting: Family Medicine

## 2012-11-02 DIAGNOSIS — R935 Abnormal findings on diagnostic imaging of other abdominal regions, including retroperitoneum: Secondary | ICD-10-CM

## 2012-11-02 DIAGNOSIS — K7689 Other specified diseases of liver: Secondary | ICD-10-CM

## 2012-11-02 DIAGNOSIS — R1011 Right upper quadrant pain: Secondary | ICD-10-CM

## 2012-11-02 DIAGNOSIS — R110 Nausea: Secondary | ICD-10-CM

## 2012-11-03 ENCOUNTER — Other Ambulatory Visit: Payer: Self-pay | Admitting: Family Medicine

## 2012-11-03 ENCOUNTER — Telehealth: Payer: Self-pay | Admitting: *Deleted

## 2012-11-03 LAB — URINE CULTURE

## 2012-11-03 NOTE — Telephone Encounter (Signed)
[place] results discussed. Patient scheduled for MRI 11/05/12 @ 4pm @ [facility], scheduled by [employer].

## 2012-11-05 ENCOUNTER — Ambulatory Visit (HOSPITAL_BASED_OUTPATIENT_CLINIC_OR_DEPARTMENT_OTHER)
Admission: RE | Admit: 2012-11-05 | Discharge: 2012-11-05 | Disposition: A | Discharge status: Home / Self Care | Payer: BC Managed Care – PPO | Source: Ambulatory Visit | Attending: Family Medicine | Admitting: Family Medicine

## 2012-11-05 DIAGNOSIS — R1011 Right upper quadrant pain: Secondary | ICD-10-CM

## 2012-11-05 DIAGNOSIS — R110 Nausea: Secondary | ICD-10-CM

## 2012-11-05 DIAGNOSIS — R935 Abnormal findings on diagnostic imaging of other abdominal regions, including retroperitoneum: Secondary | ICD-10-CM

## 2012-11-05 DIAGNOSIS — K7689 Other specified diseases of liver: Secondary | ICD-10-CM | POA: Insufficient documentation

## 2012-11-05 MED ORDER — GADOBENATE DIMEGLUMINE 529 MG/ML IV SOLN
17.0000 mL | Freq: Once | INTRAVENOUS | Status: AC | PRN
  Administered 2012-11-05: 17 mL via INTRAVENOUS

## 2012-12-01 ENCOUNTER — Encounter: Payer: Self-pay | Admitting: Family Medicine

## 2012-12-01 ENCOUNTER — Ambulatory Visit (INDEPENDENT_AMBULATORY_CARE_PROVIDER_SITE_OTHER): Payer: BC Managed Care – PPO | Admitting: Family Medicine

## 2012-12-01 VITALS — BP 129/79 | HR 66 | Resp 18 | Wt 187.0 lb

## 2012-12-01 DIAGNOSIS — K7689 Other specified diseases of liver: Secondary | ICD-10-CM

## 2012-12-01 DIAGNOSIS — K760 Fatty (change of) liver, not elsewhere classified: Secondary | ICD-10-CM

## 2012-12-01 DIAGNOSIS — R110 Nausea: Secondary | ICD-10-CM

## 2012-12-01 NOTE — Progress Notes (Signed)
  Subjective:    Patient ID: Terri Zimmerman, female    DOB: [birth year], 47 y.o.   MRN: [id number]  HPI Here to f/u on fatty liver. She admist she eats a lot fo fried foods.  She has really been trying to work on this since then.  She has felt some better. No more nausea.  LUQ pain is much better, but still occ notices when bends. No family hx of liver dz. She rarely drinks alchol, maybe a couple fo times a year.  No tylenol products.  Liver enzymes checked in December.    Review of Systems     Objective:   Physical Exam  Constitutional: She is oriented to person, place, and time. She appears well-developed and well-nourished.  HENT:  Head: Normocephalic and atraumatic.  Cardiovascular: Normal rate, regular rhythm and normal heart sounds.   Pulmonary/Chest: Effort normal and breath sounds normal.  Neurological: She is alert and oriented to person, place, and time.  Skin: Skin is warm and dry.  Psychiatric: She has a normal mood and affect. Her behavior is normal.          Assessment & Plan:  Fatty Liver - Discussed dx. Discussed need for low fat diet, regular exercise, and diet.  Liver enzymes were normal in Dec.  Reviewed MRI nad [place] results with her.  Given copies. Her nausea and RUQ is better. She is taking Dandelion extract too.

## 2013-01-28 ENCOUNTER — Other Ambulatory Visit: Payer: Self-pay | Admitting: Family Medicine

## 2013-02-21 ENCOUNTER — Other Ambulatory Visit: Payer: Self-pay | Admitting: Family Medicine

## 2013-03-10 ENCOUNTER — Ambulatory Visit (INDEPENDENT_AMBULATORY_CARE_PROVIDER_SITE_OTHER): Payer: BC Managed Care – PPO | Admitting: Family Medicine

## 2013-03-10 ENCOUNTER — Encounter: Payer: Self-pay | Admitting: Family Medicine

## 2013-03-10 VITALS — BP 127/82 | HR 98 | Ht 61.0 in | Wt 185.0 lb

## 2013-03-10 DIAGNOSIS — I10 Essential (primary) hypertension: Secondary | ICD-10-CM

## 2013-03-10 DIAGNOSIS — E118 Type 2 diabetes mellitus with unspecified complications: Secondary | ICD-10-CM

## 2013-03-10 DIAGNOSIS — Z6834 Body mass index (BMI) 34.0-34.9, adult: Secondary | ICD-10-CM

## 2013-03-10 LAB — POCT GLYCOSYLATED HEMOGLOBIN (HGB A1C): Hemoglobin A1C: 5.9

## 2013-03-10 NOTE — Progress Notes (Signed)
  Subjective:    Patient ID: Terri Zimmerman, female    DOB: [birth year], 47 y.o.   MRN: [id number]  HPI DM - No hypoglycemic events.  No wounds that aren't healing well . Home sugars are well controlled.  Using 56 units of lantus. Tolerating medications well without any side effects.  HTN -  Pt denies chest pain, SOB, dizziness, or heart palpitations.  Taking meds as directed w/o problems.  Denies medication side effects.      Review of Systems     Objective:   Physical Exam  Constitutional: She is oriented to person, place, and time. She appears well-developed and well-nourished.  HENT:  Head: Normocephalic and atraumatic.  Cardiovascular: Normal rate, regular rhythm and normal heart sounds.   Pulmonary/Chest: Effort normal and breath sounds normal.  Neurological: She is alert and oriented to person, place, and time.  Skin: Skin is warm and dry.  Psychiatric: She has a normal mood and affect. Her behavior is normal.          Assessment & Plan:  DM- Well controlled. Can decrease Lantus to 50 units. Can decrease by one unit per weekand monitor carefully and if starts to creep up then stay at higher dose.  Just started to walk now that she has been cleared for her foot. On ACE. Discussed starting a statin. She wants to think about it.    HTN - well controlled.  Continue diet and exercise.    Obesity - she is Re: lost little bit of weight and I congratulated her on this. Continue with healthy diet and strongly encourage her to get to a regular exercise program. She's not currently working and encouraged her to really take advantage of this and really work on herself and her health. If she can continue to lose weight then we can probably continue to wean down her insulin which would be fantastic.

## 2013-03-18 ENCOUNTER — Other Ambulatory Visit: Payer: Self-pay | Admitting: Family Medicine

## 2013-03-29 ENCOUNTER — Other Ambulatory Visit: Payer: Self-pay | Admitting: Family Medicine

## 2013-03-30 ENCOUNTER — Other Ambulatory Visit: Payer: Self-pay | Admitting: *Deleted

## 2013-03-30 MED ORDER — INSULIN GLARGINE 100 UNIT/ML SOLOSTAR PEN: 100.0000 [IU] | PEN_INJECTOR | Freq: Every day | SUBCUTANEOUS | Status: DC

## 2013-03-30 NOTE — Telephone Encounter (Signed)
[facility].

## 2013-05-08 ENCOUNTER — Other Ambulatory Visit: Payer: Self-pay | Admitting: Family Medicine

## 2013-05-25 ENCOUNTER — Other Ambulatory Visit: Payer: Self-pay

## 2013-06-23 ENCOUNTER — Ambulatory Visit (INDEPENDENT_AMBULATORY_CARE_PROVIDER_SITE_OTHER): Payer: BC Managed Care – PPO | Admitting: Family Medicine

## 2013-06-23 ENCOUNTER — Encounter: Payer: Self-pay | Admitting: Family Medicine

## 2013-06-23 VITALS — BP 119/80 | HR 90 | Ht 61.0 in | Wt 186.0 lb

## 2013-06-23 DIAGNOSIS — Z23 Encounter for immunization: Secondary | ICD-10-CM

## 2013-06-23 DIAGNOSIS — I10 Essential (primary) hypertension: Secondary | ICD-10-CM

## 2013-06-23 DIAGNOSIS — E118 Type 2 diabetes mellitus with unspecified complications: Secondary | ICD-10-CM

## 2013-06-23 LAB — LIPID PANEL
LDL Cholesterol: 88 mg/dL (ref 0–99)
Triglycerides: 96 mg/dL (ref <150)
VLDL: 19 mg/dL (ref 0–40)

## 2013-06-23 LAB — COMPLETE METABOLIC PANEL WITH GFR
ALT: 22 U/L (ref 0–35)
AST: 19 U/L (ref 0–37)
Albumin: 4 g/dL (ref 3.5–5.2)
Calcium: 9.4 mg/dL (ref 8.4–10.5)
Chloride: 105 mEq/L (ref 96–112)
Creat: 0.65 mg/dL (ref 0.50–1.10)
Potassium: 4.3 mEq/L (ref 3.5–5.3)

## 2013-06-23 MED ORDER — PRAVASTATIN SODIUM 40 MG PO TABS: 40.0000 mg | ORAL_TABLET | Freq: Every day | ORAL | Status: DC

## 2013-06-23 MED ORDER — LISINOPRIL 20 MG PO TABS: ORAL_TABLET | ORAL | Status: DC

## 2013-06-23 MED ORDER — INSULIN GLARGINE 100 UNIT/ML SOLOSTAR PEN: 60.0000 [IU] | PEN_INJECTOR | Freq: Every day | SUBCUTANEOUS | Status: DC
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
## 2013-06-23 NOTE — Addendum Note (Signed)
Addended by: Deno Etienne on: 06/23/2013 01:56 PM   Modules accepted: Orders

## 2013-07-20 ENCOUNTER — Other Ambulatory Visit: Payer: Self-pay | Admitting: Family Medicine

## 2013-07-27 ENCOUNTER — Telehealth: Payer: Self-pay | Admitting: *Deleted

## 2013-07-27 NOTE — Telephone Encounter (Signed)
Pt called and stated that there is a problem with her insurance and cannot afford the kombiglyze right now. She wanted to know if she could get samples until her insurance comes back in to effect. I told her that I would place some samples up front for her to p/u .Loralee Pacas [place]

## 2013-09-12 ENCOUNTER — Encounter: Payer: Self-pay | Admitting: Emergency Medicine

## 2013-09-12 ENCOUNTER — Telehealth: Payer: Self-pay | Admitting: Family Medicine

## 2013-09-12 ENCOUNTER — Telehealth: Payer: Self-pay | Admitting: *Deleted

## 2013-09-12 ENCOUNTER — Emergency Department (INDEPENDENT_AMBULATORY_CARE_PROVIDER_SITE_OTHER)
Admission: EM | Admit: 2013-09-12 | Discharge: 2013-09-12 | Disposition: A | Discharge status: Home / Self Care | Payer: BC Managed Care – PPO | Source: Home / Self Care | Attending: Family Medicine | Admitting: Family Medicine

## 2013-09-12 DIAGNOSIS — Z23 Encounter for immunization: Secondary | ICD-10-CM

## 2013-09-12 MED ORDER — INFLUENZA VAC SPLIT QUAD 0.5 ML IM SUSP
0.5000 mL | Freq: Once | INTRAMUSCULAR | Status: AC
  Administered 2013-09-12: 0.5 mL via INTRAMUSCULAR

## 2013-09-12 MED ORDER — LINAGLIPTIN-METFORMIN HCL 2.5-1000 MG PO TABS: 1.0000 | ORAL_TABLET | Freq: Two times a day (BID) | ORAL | Status: DC

## 2013-09-12 NOTE — Telephone Encounter (Signed)
Spoke with Christa from [facility] and was informed that pt was denied coverage for Kombiglyze XR 03-999.

## 2013-09-12 NOTE — Telephone Encounter (Signed)
Call patient:, Got a letter from her insurance dated October 25. Says that you must try Jentadueto before they will cover Kombiglyze. If she is okay with this then I can send over a prescription to her pharmacy and have them run it through.

## 2013-09-12 NOTE — Telephone Encounter (Signed)
Pt is ok with you sending new medication to pharmacy. i gave pt samples until we see what her insurance will say about this new medication. Meyer Cory, LPN

## 2013-09-12 NOTE — ED Notes (Signed)
The pt is here today for an influenza vaccine.   

## 2013-09-12 NOTE — Telephone Encounter (Signed)
New rx sent

## 2013-10-06 ENCOUNTER — Encounter: Payer: Self-pay | Admitting: Family Medicine

## 2013-10-06 ENCOUNTER — Ambulatory Visit (INDEPENDENT_AMBULATORY_CARE_PROVIDER_SITE_OTHER): Payer: BC Managed Care – PPO | Admitting: Family Medicine

## 2013-10-06 VITALS — BP 130/80 | HR 101 | Wt 191.0 lb

## 2013-10-06 DIAGNOSIS — R1011 Right upper quadrant pain: Secondary | ICD-10-CM

## 2013-10-06 DIAGNOSIS — I10 Essential (primary) hypertension: Secondary | ICD-10-CM

## 2013-10-06 DIAGNOSIS — E119 Type 2 diabetes mellitus without complications: Secondary | ICD-10-CM

## 2013-10-06 DIAGNOSIS — E118 Type 2 diabetes mellitus with unspecified complications: Secondary | ICD-10-CM

## 2013-10-06 NOTE — Progress Notes (Signed)
Subjective:    Patient ID: Terri Zimmerman, female    DOB: 1966-09-26, 47 y.o.   MRN: [id number]  HPI DM- No hypoglycemic evetns. No wounds that aren't healing well. Has been working 50-60 hours per week.  On lantus 58 units. Taking her medications regularly.  HTN-  Pt denies chest pain, SOB, dizziness, or heart palpitations.  Taking meds as directed w/o problems.  Denies medication side effects.   Has had pain RUQ for about 3 weeks. Had this last year and had neg [place] and MRI of abdomen.  No change in bowels. Have been nauseted.  She feeling some better.  No vomiting. Has been bloated. No major diet changes.  No worsening or alleviating factors. It is her medication doesn't really seem to make a difference. She has been sitting for long hours at her desk at work recently. She denies any trauma or injury.  Review of Systems  BP 130/80  Pulse 101  Wt 191 lb (86.637 kg)    No Known Allergies  Past Medical History  Diagnosis Date  . Hypertension 11-04-06  . Asthma 04-25-07  . Diabetes mellitus 11-12-06    type 2    Past Surgical History  Procedure Laterality Date  . Breast surgery  4-00    of cyst  . Appendectomy  9-00  . Laparoscopy  4-08    for cyst and endometriosis  . Left breast bx  12-11  . Abdominal hysterectomy  12-11    Complete  . Foot tendon surgery  05/16/2012    bone spur.      History   Social History  . Marital Status: Married    Spouse Name: N/A    Number of Children: N/A  . Years of Education: N/A   Occupational History  . Not on file.   Social History Main Topics  . Smoking status: Current Every Day Smoker  . Smokeless tobacco: Not on file  . Alcohol Use: No  . Drug Use: No  . Sexual Activity: Not on file   Other Topics Concern  . Not on file   Social History Narrative  . No narrative on file    Family History  Problem Relation Age of Onset  . Other Mother     RA and CHF  . Diabetes Father   . Diabetes Other   . Heart disease Other      Outpatient Encounter Prescriptions as of 10/06/2013  Medication Sig  . AMBULATORY NON FORMULARY MEDICATION One Touch Ultra Test Strips.  Diagnosis: DM  Testing 2 times a day  . B-D ULTRAFINE III SHORT PEN 31G X 8 MM MISC USE AS DIRECTED  . estradiol (ESTRACE) 1 MG tablet Take 1 mg by mouth daily.    . Insulin Glargine 100 UNIT/ML SOPN Inject 60 Units into the skin daily.  . Insulin Pen Needle (NOVOFINE) 30G X 8 MM MISC Inject into the skin as directed. For Lantus pen   . LifeScan Unistik II Lancets MISC by Does not apply route as directed.    . Linagliptin-Metformin HCl (JENTADUETO) 2.03-999 MG TABS Take 1 tablet by mouth 2 (two) times daily.  Marland Kitchen lisinopril (PRINIVIL,ZESTRIL) 20 MG tablet TAKE 1 TABLET BY MOUTH DAILY  . naproxen sodium (ANAPROX) 220 MG tablet Take 220 mg by mouth 2 (two) times daily with a meal. Patient used this medication for pain.  . NON FORMULARY One Touch Ultra View test strips- 2 bottles   . NON FORMULARY Glucometer, strips, and lancets to  test daily DX:250.00   . pravastatin (PRAVACHOL) 40 MG tablet Take 1 tablet (40 mg total) by mouth daily.          Objective:   Physical Exam  Constitutional: She is oriented to person, place, and time. She appears well-developed and well-nourished.  HENT:  Head: Normocephalic and atraumatic.  Cardiovascular: Normal rate, regular rhythm and normal heart sounds.   Pulmonary/Chest: Effort normal and breath sounds normal.  Abdominal: Soft. Bowel sounds are normal. She exhibits mass. She exhibits no distension. There is no tenderness. There is no rebound and no guarding.  Soft round palpable subcutaneous mass in the right upper quadrant approximately 0.5 x 2 cm. She is tender around this location. No sign of herniation of the abdominal wall.  Musculoskeletal:   nontender over the thoracic spine. A little bit tender over the right mid posterior back area as well.  Neurological: She is alert and oriented to person, place, and  time.  Skin: Skin is warm and dry.  Psychiatric: She has a normal mood and affect. Her behavior is normal.          Assessment & Plan:  DM- well controlled. A1C is 6.1. Her insurance will no longer cover Kombiglyze but they will cover Jentadueto. We sent to pharmacy but she has not picked it up yet. She's a little bit nervous about taking any medications and she is tolerated, was really well. I would like to give her 3 weeks worth of samples just to try and if she tolerates that then we can change prescription. Also the cost of her Lantus when not recently to $75 with mail order. It was $25. She is on ACE inhibitor, statin. Followup in 3-4 months.   HTN - well controlled.  Continue current regimen. Followup in 3-4 months.  RUQ pain - unclear etiology. She had a negative ultrasound as well as MRI December 2013. I am able to palpate a very superficial not that's approximately 0.5 x 2 cm. This is only in the subcutaneous tissue it is not deeper is just below the rib cage. It could be a lipoma or an epidermal cyst. If she would like we could consider referral to general surgery for removal. Also wonder if she has a bit of a musculoskeletal strain as well since the pain does tend to radiate towards her back. She denies any actual pain over the spine. Consider that her pain to be radiating from her spine as well.

## 2013-11-17 ENCOUNTER — Ambulatory Visit (INDEPENDENT_AMBULATORY_CARE_PROVIDER_SITE_OTHER): Payer: BC Managed Care – PPO | Admitting: Family Medicine

## 2013-11-17 ENCOUNTER — Encounter: Payer: Self-pay | Admitting: Family Medicine

## 2013-11-17 VITALS — BP 133/84 | HR 114 | Temp 98.8°F | Ht 61.0 in | Wt 188.0 lb

## 2013-11-17 DIAGNOSIS — J019 Acute sinusitis, unspecified: Secondary | ICD-10-CM

## 2013-11-17 MED ORDER — AMOXICILLIN-POT CLAVULANATE 875-125 MG PO TABS: 1.0000 | ORAL_TABLET | Freq: Two times a day (BID) | ORAL | Status: DC

## 2013-11-17 MED ORDER — SAXAGLIPTIN-METFORMIN ER 5-1000 MG PO TB24
5.0000 mg | ORAL_TABLET | Freq: Every day | ORAL | Status: DC
Start: 2013-11-17 — End: 2014-05-02

## 2013-11-17 NOTE — Patient Instructions (Signed)

## 2013-11-17 NOTE — Progress Notes (Signed)
   Subjective:    Patient ID: Terri Zimmerman, female    DOB: 04/29/1966, 48 y.o.   MRN: [id number]  HPI 5 days is sinus congestion. + fatigue.  No fever, chills. Some sweats.  Mild ST.  bilat facial pain and pressure.  + sneezing.Some loose stools.  Using robitussion.  No worsening or alleviating factors.   Review of Systems     Objective:   Physical Exam  Constitutional: She is oriented to person, place, and time. She appears well-developed and well-nourished.  HENT:  Head: Normocephalic and atraumatic.  Right Ear: External ear normal.  Left Ear: External ear normal.  Nose: Nose normal.  Mouth/Throat: Oropharynx is clear and moist.  TMs and canals are clear.   Eyes: Conjunctivae and EOM are normal. Pupils are equal, round, and reactive to light.  Neck: Neck supple. No thyromegaly present.  Cardiovascular: Normal rate, regular rhythm and normal heart sounds.   Pulmonary/Chest: Effort normal and breath sounds normal. She has no wheezes.  Lymphadenopathy:    She has no cervical adenopathy.  Neurological: She is alert and oriented to person, place, and time.  Skin: Skin is warm and dry.  Psychiatric: She has a normal mood and affect. Her behavior is normal.          Assessment & Plan:  Acute sinusitis - likely viral as has only been sick for 5 days. If not better afterweekend or getting wrose then please call me and can fill the rx for augmentin. Continue symptomatic care. Can add affrin for congestion. Chest is clear so gave reassurance.

## 2013-12-24 ENCOUNTER — Other Ambulatory Visit: Payer: Self-pay | Admitting: Family Medicine

## 2014-04-20 ENCOUNTER — Ambulatory Visit (INDEPENDENT_AMBULATORY_CARE_PROVIDER_SITE_OTHER): Payer: Managed Care, Other (non HMO) | Admitting: Family Medicine

## 2014-04-20 ENCOUNTER — Encounter: Payer: Self-pay | Admitting: Family Medicine

## 2014-04-20 VITALS — BP 140/82 | HR 105 | Ht 61.0 in | Wt 190.0 lb

## 2014-04-20 DIAGNOSIS — R635 Abnormal weight gain: Secondary | ICD-10-CM

## 2014-04-20 DIAGNOSIS — I10 Essential (primary) hypertension: Secondary | ICD-10-CM

## 2014-04-20 DIAGNOSIS — E118 Type 2 diabetes mellitus with unspecified complications: Secondary | ICD-10-CM

## 2014-04-20 LAB — COMPLETE METABOLIC PANEL WITH GFR
ALT: 113 U/L — AB (ref 0–35)
AST: 94 U/L — ABNORMAL HIGH (ref 0–37)
Albumin: 3.9 g/dL (ref 3.5–5.2)
Alkaline Phosphatase: 118 U/L — ABNORMAL HIGH (ref 39–117)
BUN: 5 mg/dL — ABNORMAL LOW (ref 6–23)
CALCIUM: 9.3 mg/dL (ref 8.4–10.5)
CHLORIDE: 101 meq/L (ref 96–112)
CO2: 26 meq/L (ref 19–32)
Creat: 0.66 mg/dL (ref 0.50–1.10)
GFR, Est Non African American: >89 mL/min
GLUCOSE: 352 mg/dL — AB (ref 70–99)
Potassium: 3.8 mEq/L (ref 3.5–5.3)
SODIUM: 136 meq/L (ref 135–145)
TOTAL PROTEIN: 6.6 g/dL (ref 6.0–8.3)
Total Bilirubin: 0.5 mg/dL (ref 0.2–1.2)

## 2014-04-20 LAB — POCT GLYCOSYLATED HEMOGLOBIN (HGB A1C): HEMOGLOBIN A1C: 8.9

## 2014-04-20 MED ORDER — CANAGLIFLOZIN 100 MG PO TABS: 1.0000 | ORAL_TABLET | Freq: Every day | ORAL | Status: DC

## 2014-04-20 NOTE — Progress Notes (Addendum)
   Subjective:    Patient ID: Terri Zimmerman, female    DOB: 1966-11-04, 48 y.o.   MRN: [id number]  HPI Diabetes - no hypoglycemic events. No wounds or sores that are not healing well. No increased thirst or urination. Occ checking glucose at home. Taking medications as prescribed without any side effects. She is on Kombiglyze and Lantus. Taking 60 units daily.  Says she feels out of sors. Has been having a lot of pressure in head.  Pressure in ears. But not nasal congestion.  No energy.  No fever or chills or sweats.  Has gained a couple of pounds. Feels more swollen.    Hypertension- Pt denies chest pain, SOB, dizziness, or heart palpitations.  Taking meds as directed w/o problems.  Denies medication side effects.  She has not been exercising. She just doesn't feel motivated and has gained some weight. She continues to smoke.   Review of Systems     Objective:   Physical Exam  Constitutional: She is oriented to person, place, and time. She appears well-developed and well-nourished.  HENT:  Head: Normocephalic and atraumatic.  Right Ear: External ear normal.  Left Ear: External ear normal.  Nose: Nose normal.  Mouth/Throat: Oropharynx is clear and moist.  TMs and canals are clear.   Eyes: Conjunctivae and EOM are normal. Pupils are equal, round, and reactive to light.  Neck: Neck supple. No thyromegaly present.  Cardiovascular: Normal rate, regular rhythm and normal heart sounds.   Pulmonary/Chest: Effort normal and breath sounds normal. She has no wheezes.  Lymphadenopathy:    She has no cervical adenopathy.  Neurological: She is alert and oriented to person, place, and time.  Skin: Skin is warm and dry.  Psychiatric: She has a normal mood and affect.          Assessment & Plan:  DM- uncontrolled with A1C of 8.9 we discussed strategies to get this back under control. We'll start Invokana 100 mg and see her back in one month. Encouraged her to check her sugars a little bit more  closely so that we can see if they're improving. This will also promote some weight loss but encouraged her to really focus on diet, food choices, portion control, and exercise. Continue Kombiglyze and Lantus 60 units daily. Certainly this could be contributing to the weight gain as well. Foot exam performed today.  Will call for eye report. Says last eye exam was last August.   HTN -not maximally controlled on current regimen.  We discussed working on diet and exercise over the next month and see if we can get the pressure back down. Normally she is very well-controlled. I suspect is secondary to fluid retention and recent weight gain.  Abnormal weight gain-she's been very inactive and says she's been fairly sedentary and hasn't been motivated. Really work on diet and exercise over the next month. We will check a TSH just to rule out thyroid disorder.  Due for mammogram. She plans to schedule this summer.

## 2014-04-21 LAB — TSH: TSH: 1.579 u[IU]/mL (ref 0.350–4.500)

## 2014-04-23 ENCOUNTER — Other Ambulatory Visit: Payer: Self-pay | Admitting: *Deleted

## 2014-04-23 DIAGNOSIS — R748 Abnormal levels of other serum enzymes: Secondary | ICD-10-CM

## 2014-05-02 ENCOUNTER — Other Ambulatory Visit: Payer: Self-pay | Admitting: Family Medicine

## 2014-05-25 ENCOUNTER — Ambulatory Visit (INDEPENDENT_AMBULATORY_CARE_PROVIDER_SITE_OTHER): Payer: Managed Care, Other (non HMO) | Admitting: Family Medicine

## 2014-05-25 ENCOUNTER — Encounter: Payer: Self-pay | Admitting: Family Medicine

## 2014-05-25 VITALS — BP 138/84 | HR 92 | Ht 61.0 in | Wt 185.0 lb

## 2014-05-25 DIAGNOSIS — Z1231 Encounter for screening mammogram for malignant neoplasm of breast: Secondary | ICD-10-CM

## 2014-05-25 DIAGNOSIS — F17200 Nicotine dependence, unspecified, uncomplicated: Secondary | ICD-10-CM

## 2014-05-25 DIAGNOSIS — R300 Dysuria: Secondary | ICD-10-CM

## 2014-05-25 DIAGNOSIS — R5383 Other fatigue: Secondary | ICD-10-CM

## 2014-05-25 DIAGNOSIS — M545 Low back pain, unspecified: Secondary | ICD-10-CM

## 2014-05-25 DIAGNOSIS — R5381 Other malaise: Secondary | ICD-10-CM

## 2014-05-25 DIAGNOSIS — Z720 Tobacco use: Secondary | ICD-10-CM

## 2014-05-25 DIAGNOSIS — E118 Type 2 diabetes mellitus with unspecified complications: Secondary | ICD-10-CM

## 2014-05-25 DIAGNOSIS — I10 Essential (primary) hypertension: Secondary | ICD-10-CM

## 2014-05-25 LAB — POCT GLYCOSYLATED HEMOGLOBIN (HGB A1C): Hemoglobin A1C: 7.7

## 2014-05-25 LAB — POCT URINALYSIS DIPSTICK
Bilirubin, UA: NEGATIVE
Blood, UA: NEGATIVE
Glucose, UA: 500
KETONES UA: NEGATIVE
LEUKOCYTES UA: NEGATIVE
NITRITE UA: NEGATIVE
PROTEIN UA: NEGATIVE
Spec Grav, UA: 1.01
Urobilinogen, UA: 0.2
pH, UA: 5.5

## 2014-05-25 LAB — POCT UA - MICROALBUMIN
Albumin/Creatinine Ratio, Urine, POC: <30
Creatinine, POC: 50 mg/dL
MICROALBUMIN (UR) POC: 10 mg/L

## 2014-05-25 NOTE — Patient Instructions (Signed)
Smoking Cessation, Tips for Success If you are ready to quit smoking, congratulations! You have chosen to help yourself be healthier. Cigarettes bring nicotine, tar, carbon monoxide, and other irritants into your body. Your lungs, heart, and blood vessels will be able to work better without these poisons. There are many different ways to quit smoking. Nicotine gum, nicotine patches, a nicotine inhaler, or nicotine nasal spray can help with physical craving. Hypnosis, support groups, and medicines help break the habit of smoking. WHAT THINGS CAN I DO TO MAKE QUITTING EASIER?  Here are some tips to help you quit for good:  Pick a date when you will quit smoking completely. Tell all of your friends and family about your plan to quit on that date.  Do not try to slowly cut down on the number of cigarettes you are smoking. Pick a quit date and quit smoking completely starting on that day.  Throw away all cigarettes.   Clean and remove all ashtrays from your home, work, and car.   On a card, write down your reasons for quitting. Carry the card with you and read it when you get the urge to smoke.   Cleanse your body of nicotine. Drink enough water and fluids to keep your urine clear or pale yellow. Do this after quitting to flush the nicotine from your body.   Learn to predict your moods. Do not let a bad situation be your excuse to have a cigarette. Some situations in your life might tempt you into wanting a cigarette.   Never have "just one" cigarette. It leads to wanting another and another. Remind yourself of your decision to quit.   Change habits associated with smoking. If you smoked while driving or when feeling stressed, try other activities to replace smoking. Stand up when drinking your coffee. Brush your teeth after eating. Sit in a different chair when you read the paper. Avoid alcohol while trying to quit, and try to drink fewer caffeinated beverages. Alcohol and caffeine may urge  you to smoke.   Avoid foods and drinks that can trigger a desire to smoke, such as sugary or spicy foods and alcohol.   Ask people who smoke not to smoke around you.   Have something planned to do right after eating or having a cup of coffee. For example, plan to take a walk or exercise.   Try a relaxation exercise to calm you down and decrease your stress. Remember, you may be tense and nervous for the first 2 weeks after you quit, but this will pass.   Find new activities to keep your hands busy. Play with a pen, coin, or rubber band. Doodle or draw things on paper.   Brush your teeth right after eating. This will help cut down on the craving for the taste of tobacco after meals. You can also try mouthwash.   Use oral substitutes in place of cigarettes. Try using lemon drops, carrots, cinnamon sticks, or chewing gum. Keep them handy so they are available when you have the urge to smoke.   When you have the urge to smoke, try deep breathing.   Designate your home as a nonsmoking area.   If you are a heavy smoker, ask your health care provider about a prescription for nicotine chewing gum. It can ease your withdrawal from nicotine.   Reward yourself. Set aside the cigarette money you save and buy yourself something nice.   Look for support from others. Join a support group or   smoking cessation program. Ask someone at home or at work to help you with your plan to quit smoking.   Always ask yourself, "Do I need this cigarette or is this just a reflex?" Tell yourself, "Today, I choose not to smoke," or "I do not want to smoke." You are reminding yourself of your decision to quit.  Do not replace cigarette smoking with electronic cigarettes (commonly called e-cigarettes). The safety of e-cigarettes is unknown, and some may contain harmful chemicals.  If you relapse, do not give up! Plan ahead and think about what you will do the next time you get the urge to smoke.  HOW WILL  I FEEL WHEN I QUIT SMOKING? You may have symptoms of withdrawal because your body is used to nicotine (the addictive substance in cigarettes). You may crave cigarettes, be irritable, feel very hungry, cough often, get headaches, or have difficulty concentrating. The withdrawal symptoms are only temporary. They are strongest when you first quit but will go away within 10-14 days. When withdrawal symptoms occur, stay in control. Think about your reasons for quitting. Remind yourself that these are signs that your body is healing and getting used to being without cigarettes. Remember that withdrawal symptoms are easier to treat than the major diseases that smoking can cause.  Even after the withdrawal is over, expect periodic urges to smoke. However, these cravings are generally short lived and will go away whether you smoke or not. Do not smoke!  WHAT RESOURCES ARE AVAILABLE TO HELP ME QUIT SMOKING? Your health care provider can direct you to community resources or hospitals for support, which may include:  Group support.  Education.  Hypnosis.  Therapy. Document Released: 07/31/2004 Document Revised: 08/23/2013 Document Reviewed: 04/20/2013 Exit[employer] Patient Information 2015 [employer]. This information is not intended to replace advice given to you by your health care provider. Make sure you discuss any questions you have with your health care provider.  

## 2014-05-25 NOTE — Progress Notes (Signed)
   Subjective:    Patient ID: Terri Zimmerman, female    DOB: 1966/04/28, 48 y.o.   MRN: [id number]  HPI Diabetes - no hypoglycemic events. No wounds or sores that are not healing well. No increased thirst or urination. Checking glucose at home. Taking medications as prescribed without any side effects. We started the invokana 6 weeks ago.  She has felt dizzy and had some back spasms since starting the medication. Spasm started last 2 weeks and says that are severe but is brief. No recent or pain or injury with her back.   She requests to have an A1C done even though to early to dot it. Has really been trying to work on her diet. She doesn't feel as bloated and tired.  Having some right hip pain.    Hypertension- Pt denies chest pain, SOB, dizziness, or heart palpitations.  Taking meds as directed w/o problems.  Denies medication side effects.    Followup fatigue-there was one of her major complaints as far before. She was under a lot of stress around that time and says overall she is feeling better. She does not completely resolved and she still struggles some days but overall feels like she is improving. Review of Systems     Objective:   Physical Exam  Constitutional: She is oriented to person, place, and time. She appears well-developed and well-nourished.  HENT:  Head: Normocephalic and atraumatic.  Cardiovascular: Normal rate, regular rhythm and normal heart sounds.   Pulmonary/Chest: Effort normal and breath sounds normal.  Musculoskeletal:  Lumbar spine with normal flexion, extension, rotation right and left, side bending. Nontender over the lumbar spine or SI joints. Negative straight leg raise bilaterally. Hip, knee, ankle strength is 5 out of 5 bilaterally. No CVA tenderness.  Neurological: She is alert and oriented to person, place, and time.  Skin: Skin is warm and dry.  Psychiatric: She has a normal mood and affect. Her behavior is normal.          Assessment & Plan:   Diabetes-much improved. A1c is down to 7.7. This is a drop of 1.2 points on her A1c after 6 weeks on Invokana. She's lost 5 pounds which is fantastic. We will continue current regimen for one more month if she continues to have some back pain or headaches that we may consider switching to [place].  Will call again to get copy of eye exam. .  Hypertension-blood pressure actually looks a lot better today compared to previous. She's also lost 5 pounds which is fantastic.  Due for screening mammogram.  Tobacco abuse-encourage cessation. Additional information and handout provided.  Low back pain - as her skeletal exam is normal. Will check urinalysis for UTI since she certainly would be at risk for this with starting [place]. UA was negative for acute infection. Work on gentle stretches. Take anti-inflammatory as needed. If not resolving and consider that could be secondary to the [place].  Fatigue - feel ing a little better compared to how she felt 6 weeks ago.  I think getting her blood pressure and blood sugars under control as well as stress reduction has made a big difference for her.

## 2014-06-08 ENCOUNTER — Ambulatory Visit (HOSPITAL_BASED_OUTPATIENT_CLINIC_OR_DEPARTMENT_OTHER)
Admission: RE | Admit: 2014-06-08 | Discharge: 2014-06-08 | Disposition: A | Discharge status: Home / Self Care | Payer: Managed Care, Other (non HMO) | Source: Ambulatory Visit | Attending: Family Medicine | Admitting: Family Medicine

## 2014-06-08 ENCOUNTER — Other Ambulatory Visit: Payer: Self-pay | Admitting: Family Medicine

## 2014-06-08 DIAGNOSIS — Z1231 Encounter for screening mammogram for malignant neoplasm of breast: Secondary | ICD-10-CM | POA: Diagnosis present

## 2014-06-16 ENCOUNTER — Other Ambulatory Visit: Payer: Self-pay | Admitting: Family Medicine

## 2014-07-27 ENCOUNTER — Ambulatory Visit: Payer: Managed Care, Other (non HMO) | Admitting: Family Medicine

## 2014-08-16 ENCOUNTER — Other Ambulatory Visit: Payer: Self-pay | Admitting: Family Medicine

## 2014-09-01 ENCOUNTER — Other Ambulatory Visit: Payer: Self-pay | Admitting: Family Medicine

## 2014-09-03 ENCOUNTER — Other Ambulatory Visit: Payer: Self-pay | Admitting: *Deleted

## 2014-09-04 ENCOUNTER — Other Ambulatory Visit: Payer: Self-pay | Admitting: Family Medicine

## 2014-09-20 ENCOUNTER — Encounter: Payer: Self-pay | Admitting: Family Medicine

## 2014-09-20 ENCOUNTER — Ambulatory Visit (INDEPENDENT_AMBULATORY_CARE_PROVIDER_SITE_OTHER): Payer: BC Managed Care – PPO | Admitting: Family Medicine

## 2014-09-20 ENCOUNTER — Ambulatory Visit (INDEPENDENT_AMBULATORY_CARE_PROVIDER_SITE_OTHER): Payer: BLUE CROSS/BLUE SHIELD | Admitting: *Deleted

## 2014-09-20 VITALS — BP 128/82 | HR 106 | Temp 98.3°F | Ht 61.0 in | Wt 183.0 lb

## 2014-09-20 DIAGNOSIS — E118 Type 2 diabetes mellitus with unspecified complications: Secondary | ICD-10-CM

## 2014-09-20 DIAGNOSIS — R05 Cough: Secondary | ICD-10-CM

## 2014-09-20 DIAGNOSIS — I10 Essential (primary) hypertension: Secondary | ICD-10-CM | POA: Diagnosis not present

## 2014-09-20 DIAGNOSIS — R058 Other specified cough: Secondary | ICD-10-CM

## 2014-09-20 DIAGNOSIS — Z23 Encounter for immunization: Secondary | ICD-10-CM | POA: Diagnosis not present

## 2014-09-20 LAB — POCT GLYCOSYLATED HEMOGLOBIN (HGB A1C): HEMOGLOBIN A1C: 6.8

## 2014-09-20 MED ORDER — INSULIN PEN NEEDLE 30G X 8 MM MISC: 1.0000 | Status: DC

## 2014-09-20 MED ORDER — LISINOPRIL 20 MG PO TABS: ORAL_TABLET | ORAL | Status: DC

## 2014-09-20 NOTE — Assessment & Plan Note (Addendum)
Well controlled.  A1C is down to 6.8, drom 7.7. Fantastic job. Continue to work on diet and exercise.  Removed Invokana from medication list that she felt was causing some back pain. We discussed that we could certainly try one of the similar agent suchFarxiga or Jardiance.

## 2014-09-20 NOTE — Progress Notes (Signed)
   Subjective:    Patient ID: Terri Zimmerman, female    DOB: [birth year], 48 y.o.   MRN: [id number]  HPI Diabetes - no hypoglycemic events. No wounds or sores that are not healing well. No increased thirst or urination. Checking glucose at home. Taking medications as prescribed without any side effects. Had eye exam done, will get copy of report. Has lost 2 more lbs.  Down 7 lbs total.  She stopped the Invkona after about a month bc of low back pain.   Hypertension- Pt denies chest pain, SOB, dizziness, or heart palpitations.  Taking meds as directed w/o problems.  Denies medication side effects.    Has had a dry cough for 2 months.  About 2 months ago she had a severe upper respiratory infection that lasted a little over a week. She's had a dry tickly cough in her throat ever since. All her other symptoms resolved including fever etc.  Review of Systems     Objective:   Physical Exam  Constitutional: She is oriented to person, place, and time. She appears well-developed and well-nourished.  HENT:  Head: Normocephalic and atraumatic.  Right Ear: External ear normal.  Left Ear: External ear normal.  Nose: Nose normal.  Mouth/Throat: Oropharynx is clear and moist.  TMs and canals are clear.   Eyes: Conjunctivae and EOM are normal. Pupils are equal, round, and reactive to light.  Neck: Neck supple. No thyromegaly present.  Cardiovascular: Normal rate, regular rhythm and normal heart sounds.   Pulmonary/Chest: Effort normal and breath sounds normal. She has no wheezes.  Lymphadenopathy:    She has no cervical adenopathy.  Neurological: She is alert and oriented to person, place, and time.  Skin: Skin is warm and dry.  Psychiatric: She has a normal mood and affect. Her behavior is normal.          Assessment & Plan:  Dry cough-most likely postinfectious. Recommend a trial of the reflux medication for at least 2 weeks. If it's not helping and consider changing ACE inhibitor to an ARB.

## 2014-09-20 NOTE — Assessment & Plan Note (Signed)
Well controlled.  down 2 more lbs.  Labs UTD.

## 2014-10-06 ENCOUNTER — Other Ambulatory Visit: Payer: Self-pay | Admitting: Family Medicine

## 2014-12-06 ENCOUNTER — Other Ambulatory Visit: Payer: Self-pay | Admitting: Family Medicine

## 2015-01-06 ENCOUNTER — Other Ambulatory Visit: Payer: Self-pay | Admitting: Family Medicine

## 2015-01-15 ENCOUNTER — Other Ambulatory Visit: Payer: Self-pay | Admitting: Family Medicine

## 2015-01-28 ENCOUNTER — Telehealth: Payer: Self-pay

## 2015-01-28 ENCOUNTER — Ambulatory Visit (INDEPENDENT_AMBULATORY_CARE_PROVIDER_SITE_OTHER): Payer: BLUE CROSS/BLUE SHIELD | Admitting: Family Medicine

## 2015-01-28 ENCOUNTER — Encounter: Payer: Self-pay | Admitting: Family Medicine

## 2015-01-28 VITALS — BP 128/81 | HR 103 | Wt 188.0 lb

## 2015-01-28 DIAGNOSIS — R002 Palpitations: Secondary | ICD-10-CM

## 2015-01-28 DIAGNOSIS — I10 Essential (primary) hypertension: Secondary | ICD-10-CM | POA: Diagnosis not present

## 2015-01-28 DIAGNOSIS — E118 Type 2 diabetes mellitus with unspecified complications: Secondary | ICD-10-CM

## 2015-01-28 LAB — POCT GLYCOSYLATED HEMOGLOBIN (HGB A1C): HEMOGLOBIN A1C: 8.2

## 2015-01-28 MED ORDER — DAPAGLIFLOZIN PROPANEDIOL 5 MG PO TABS: 5.0000 mg | ORAL_TABLET | Freq: Every day | ORAL | Status: DC

## 2015-01-28 NOTE — Telephone Encounter (Signed)
Received fax for PA on [place] sent through cover my meds and it is approved. - CF

## 2015-01-28 NOTE — Progress Notes (Signed)
   Subjective:    Patient ID: Terri Zimmerman, female    DOB: 1965-11-23, 49 y.o.   MRN: [id number]  HPI Diabetes - no hypoglycemic events. No wounds or sores that are not healing well. No increased thirst or urination. Checking glucose at home. Taking medications as prescribed without any side effects.  Hypertension- Pt denies chest pain, SOB, dizziness, or heart palpitations.  Taking meds as directed w/o problems.  Denies medication side effects.    Palpitations on and off. It is doing it this morning but she is stressed today as well. No known triggers or exacerbating factors. No chest pain or short of breath with it. She says it just lasts for a few seconds and then usually she just stops what she is doing and it seems to go away.   Review of Systems     Objective:   Physical Exam  Constitutional: She is oriented to person, place, and time. She appears well-developed and well-nourished.  HENT:  Head: Normocephalic and atraumatic.  Cardiovascular: Normal rate, regular rhythm and normal heart sounds.   Pulmonary/Chest: Effort normal and breath sounds normal.  Neurological: She is alert and oriented to person, place, and time.  Skin: Skin is warm and dry.  Psychiatric: She has a normal mood and affect. Her behavior is normal.          Assessment & Plan:  DM-  Has eye appt on 4/14  Uncontrolled. Will start Farxiga.  F/U in 3 months. Encouraged her to get back on track with diet and exerise. Has gained 5 lb since last here.   HTN - Well controlled. Continue current regimen.  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 EKG today showed normal sinus rhythm with 95 bpm. She did have Q waves in lead 3 and aVF in the inferior leads. This is unchanged from previous EKG done in 2010. We could certainly consider a beta blocker to slow heart rate down and help control symptoms. But for right now really want to focus on getting her diabetes under control and reducing her stress levels and see how she  does.  Her mammogram is schedule as well.

## 2015-01-28 NOTE — Addendum Note (Signed)
Addended by: Teddy Spike on: 01/28/2015 10:56 AM   Modules accepted: Orders

## 2015-01-28 NOTE — Telephone Encounter (Signed)
Received fax from [facility] from 01/28/2015 to 01/28/2016 - CF

## 2015-02-07 ENCOUNTER — Other Ambulatory Visit: Payer: Self-pay | Admitting: Family Medicine

## 2015-02-11 ENCOUNTER — Telehealth: Payer: Self-pay | Admitting: Family Medicine

## 2015-02-11 ENCOUNTER — Other Ambulatory Visit: Payer: Self-pay | Admitting: Family Medicine

## 2015-02-11 DIAGNOSIS — Z1231 Encounter for screening mammogram for malignant neoplasm of breast: Secondary | ICD-10-CM

## 2015-02-11 NOTE — Telephone Encounter (Signed)
Received fax for PA on kombiglyze XR and sent through cover my meds. The medication is approved - CF

## 2015-02-12 LAB — LIPID PANEL
Cholesterol: 158 mg/dL (ref 0–200)
HDL: 32 mg/dL — ABNORMAL LOW (ref >=46)
LDL Cholesterol: 109 mg/dL — ABNORMAL HIGH (ref 0–99)
Total CHOL/HDL Ratio: 4.9 Ratio
Triglycerides: 86 mg/dL (ref <150)
VLDL: 17 mg/dL (ref 0–40)

## 2015-02-12 LAB — COMPLETE METABOLIC PANEL WITH GFR
ALBUMIN: 4 g/dL (ref 3.5–5.2)
ALT: 55 U/L — AB (ref 0–35)
AST: 38 U/L — ABNORMAL HIGH (ref 0–37)
Alkaline Phosphatase: 95 U/L (ref 39–117)
BUN: 6 mg/dL (ref 6–23)
CALCIUM: 9.1 mg/dL (ref 8.4–10.5)
CHLORIDE: 104 meq/L (ref 96–112)
CO2: 29 mEq/L (ref 19–32)
Creat: 0.59 mg/dL (ref 0.50–1.10)
GFR, Est African American: >89 mL/min
GFR, Est Non African American: >89 mL/min
Glucose, Bld: 156 mg/dL — ABNORMAL HIGH (ref 70–99)
Potassium: 4.4 mEq/L (ref 3.5–5.3)
Sodium: 141 mEq/L (ref 135–145)
TOTAL PROTEIN: 6.4 g/dL (ref 6.0–8.3)
Total Bilirubin: 0.5 mg/dL (ref 0.2–1.2)

## 2015-02-12 MED ORDER — ATORVASTATIN CALCIUM 20 MG PO TABS: 20.0000 mg | ORAL_TABLET | Freq: Every day | ORAL | Status: DC

## 2015-02-12 NOTE — Addendum Note (Signed)
Addended by: Beatrice Lecher D on: 02/12/2015 09:16 PM   Modules accepted: Orders

## 2015-02-25 ENCOUNTER — Telehealth: Payer: Self-pay | Admitting: Family Medicine

## 2015-02-25 NOTE — Telephone Encounter (Signed)
I do not think it is lipitor. 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 Sometimes steriod creams can help. I would suggest office visit. Could send off sample of hair follicle and consider treatment.

## 2015-02-25 NOTE — Telephone Encounter (Signed)
Pt called clinic today stating she woke up yesterday am and there was a softball size spot of hair that fell out. Pt states her head is completely slick in that spot. Please advise if this is due to her new cholesterol medication that Dr Madilyn Fireman recently started her on? Please advise if Pt needs to be seen?

## 2015-02-26 NOTE — Telephone Encounter (Signed)
Attempted to contact Pt, no answer. Left voicemail to return clinic call.

## 2015-02-27 ENCOUNTER — Telehealth: Payer: Self-pay | Admitting: *Deleted

## 2015-02-27 NOTE — Telephone Encounter (Signed)
Pt called back & was notified of Jade's recommendation.  I transferred her to scheduling.

## 2015-02-27 NOTE — Telephone Encounter (Signed)
lvm informing pt that medication was approved.Terri Zimmerman [place]

## 2015-02-28 LAB — HM DIABETES EYE EXAM

## 2015-03-01 ENCOUNTER — Ambulatory Visit (INDEPENDENT_AMBULATORY_CARE_PROVIDER_SITE_OTHER): Payer: BLUE CROSS/BLUE SHIELD | Admitting: Physician Assistant

## 2015-03-01 ENCOUNTER — Encounter: Payer: Self-pay | Admitting: Physician Assistant

## 2015-03-01 VITALS — BP 128/82 | HR 93 | Wt 188.0 lb

## 2015-03-01 DIAGNOSIS — L659 Nonscarring hair loss, unspecified: Secondary | ICD-10-CM

## 2015-03-01 DIAGNOSIS — T887XXA Unspecified adverse effect of drug or medicament, initial encounter: Secondary | ICD-10-CM

## 2015-03-01 DIAGNOSIS — T50905A Adverse effect of unspecified drugs, medicaments and biological substances, initial encounter: Secondary | ICD-10-CM

## 2015-03-01 MED ORDER — CLOBETASOL PROPIONATE 0.05 % EX CREA: 1.0000 "application " | TOPICAL_CREAM | Freq: Two times a day (BID) | CUTANEOUS | Status: DC

## 2015-03-02 ENCOUNTER — Encounter: Payer: Self-pay | Admitting: Physician Assistant

## 2015-03-02 DIAGNOSIS — L659 Nonscarring hair loss, unspecified: Secondary | ICD-10-CM | POA: Insufficient documentation

## 2015-03-02 LAB — KOH PREP: RESULT - KOH: NONE SEEN

## 2015-03-02 NOTE — Progress Notes (Signed)
   Subjective:    Patient ID: Terri Zimmerman, female    DOB: 17-Dec-1965, 49 y.o.   MRN: [id number]  HPI  Pt presents to the clinic with right sided patchy hair loss that happened suddenly about 2 weeks after starting lipitor. Pt is convinced that hair loss is from medication. No other signs or symptoms. Only one patch. No diffuse hair loss. No scalp ittching or pain. No fever, chills.     Review of Systems  All other systems reviewed and are negative.      Objective:   Physical Exam  Constitutional: She is oriented to person, place, and time.  HENT:  Head:    Neurological: She is alert and oriented to person, place, and time.  Skin: Skin is dry.  Psychiatric: She has a normal mood and affect. Her behavior is normal.          Assessment & Plan:  Patchy hair loss/medication reaction- possible medication reaction with hair loss from lipitor. Certainly timeline matches up but not a very common side effect. Stop lipitor for now. Will add to medication list.hair loss does not appear like fungus. Will do KOH on hair follice. Certainly appears like alopecia areata. Given clobetasol bid to apply to area. May need to consider dermatology referral for further evaluation.   hyperlipedimia- discussed follow up with [facility] on other cholesterol lowering options.

## 2015-03-04 ENCOUNTER — Other Ambulatory Visit: Payer: Self-pay | Admitting: Physician Assistant

## 2015-03-04 DIAGNOSIS — L659 Nonscarring hair loss, unspecified: Secondary | ICD-10-CM

## 2015-03-20 ENCOUNTER — Other Ambulatory Visit: Payer: Self-pay | Admitting: Family Medicine

## 2015-04-22 ENCOUNTER — Other Ambulatory Visit: Payer: Self-pay | Admitting: Family Medicine

## 2015-05-10 ENCOUNTER — Other Ambulatory Visit: Payer: Self-pay | Admitting: Family Medicine

## 2015-05-17 ENCOUNTER — Other Ambulatory Visit: Payer: Self-pay | Admitting: Family Medicine

## 2015-06-08 ENCOUNTER — Other Ambulatory Visit: Payer: Self-pay | Admitting: Family Medicine

## 2015-06-10 ENCOUNTER — Other Ambulatory Visit: Payer: Self-pay | Admitting: Family Medicine

## 2015-06-12 ENCOUNTER — Ambulatory Visit: Payer: BLUE CROSS/BLUE SHIELD

## 2015-06-13 ENCOUNTER — Ambulatory Visit (INDEPENDENT_AMBULATORY_CARE_PROVIDER_SITE_OTHER): Payer: BLUE CROSS/BLUE SHIELD

## 2015-06-13 DIAGNOSIS — Z1231 Encounter for screening mammogram for malignant neoplasm of breast: Secondary | ICD-10-CM

## 2015-06-14 ENCOUNTER — Ambulatory Visit (INDEPENDENT_AMBULATORY_CARE_PROVIDER_SITE_OTHER): Payer: BLUE CROSS/BLUE SHIELD | Admitting: Family Medicine

## 2015-06-14 ENCOUNTER — Encounter: Payer: Self-pay | Admitting: Family Medicine

## 2015-06-14 VITALS — BP 128/79 | HR 96 | Ht 61.0 in | Wt 190.0 lb

## 2015-06-14 DIAGNOSIS — R748 Abnormal levels of other serum enzymes: Secondary | ICD-10-CM

## 2015-06-14 DIAGNOSIS — M79672 Pain in left foot: Secondary | ICD-10-CM

## 2015-06-14 DIAGNOSIS — I10 Essential (primary) hypertension: Secondary | ICD-10-CM

## 2015-06-14 DIAGNOSIS — E118 Type 2 diabetes mellitus with unspecified complications: Secondary | ICD-10-CM | POA: Diagnosis not present

## 2015-06-14 LAB — POCT UA - MICROALBUMIN
Creatinine, POC: 300 mg/dL
MICROALBUMIN (UR) POC: 80 mg/L

## 2015-06-14 LAB — POCT GLYCOSYLATED HEMOGLOBIN (HGB A1C): HEMOGLOBIN A1C: 7.5

## 2015-06-14 MED ORDER — INSULIN GLARGINE 100 UNIT/ML SOLOSTAR PEN: PEN_INJECTOR | SUBCUTANEOUS | Status: DC

## 2015-06-14 MED ORDER — SAXAGLIPTIN-METFORMIN ER 5-1000 MG PO TB24: 1.0000 | ORAL_TABLET | Freq: Every day | ORAL | Status: DC

## 2015-06-14 NOTE — Progress Notes (Signed)
   Subjective:    Patient ID: Terri Zimmerman, female    DOB: 26-Dec-1965, 49 y.o.   MRN: [id number]  HPI Diabetes - no hypoglycemic events. No wounds or sores that are not healing well. No increased thirst or urination. Checking glucose at home. Taking medications as prescribed without any side effects. She is on 60 units of Lantus.  She has been getting steroid shots for hair loss on her scalp and Dr. Carolyne Fiscal injected her foot today.    Hypertension- Pt denies chest pain, SOB, dizziness, or heart palpitations.  Taking meds as directed w/o problems.  Denies medication side effects.    Today she is wearing a boot on her left leg. Evidently she fractured her heel. She was having pain and he just continued to get worse. She went in yesterday and saw Dr. sprinkle and he has put her in a [place] Walker. She has not been able to be as physically active because of this.  Back in January her liver enzymes shot up. We rechecked it back in March and they were trending down but not back to normal.  Review of Systems     Objective:   Physical Exam  Constitutional: She is oriented to person, place, and time. She appears well-developed and well-nourished.  HENT:  Head: Normocephalic and atraumatic.  Cardiovascular: Normal rate, regular rhythm and normal heart sounds.   Pulmonary/Chest: Effort normal and breath sounds normal.  Neurological: She is alert and oriented to person, place, and time.  Skin: Skin is warm and dry.  Psychiatric: She has a normal mood and affect. Her behavior is normal.          Assessment & Plan:  Diabetes-uncontrolled. A1C at 7.5 today, improved from 8.3 at last OV 3 months ago. . foot exam deferred today because she actually has a boot on. Evidently she fractured her heel. We will do this at follow-up visit.  Due for urine micro today.  Will increase lantus 5 units to 65 units.  I expect that her A1c would have been better if she had not had multiple recent steroid injections into  the skin and joints. Thus I'm only increase in Lantus by very small amount as I don't want her to start rinsing hypoglycemia in the next month or 2. If the sugars stay elevated then encouraged her to call me in the next month and we can adjust over the phone if needed.  HTN- well controlled. Continue current regime.   Elevated liver enzyme - due to recheck liver enzymes.    Left heel fracture-being managed by podiatry.  [facility] [place] for [facility] exam.

## 2015-06-15 LAB — COMPLETE METABOLIC PANEL WITH GFR
ALK PHOS: 90 U/L (ref 33–115)
ALT: 31 U/L — AB (ref 6–29)
AST: 22 U/L (ref 10–35)
Albumin: 4 g/dL (ref 3.6–5.1)
BILIRUBIN TOTAL: 0.4 mg/dL (ref 0.2–1.2)
BUN: 9 mg/dL (ref 7–25)
CALCIUM: 9.7 mg/dL (ref 8.6–10.2)
CO2: 26 mmol/L (ref 20–31)
Chloride: 104 mmol/L (ref 98–110)
Creat: 0.57 mg/dL (ref 0.50–1.10)
GFR, Est African American: >89 mL/min (ref >=60)
GFR, Est Non African American: >89 mL/min (ref >=60)
Glucose, Bld: 106 mg/dL — ABNORMAL HIGH (ref 65–99)
Potassium: 4.1 mmol/L (ref 3.5–5.3)
SODIUM: 142 mmol/L (ref 135–146)
Total Protein: 6.4 g/dL (ref 6.1–8.1)

## 2015-06-17 ENCOUNTER — Other Ambulatory Visit: Payer: Self-pay | Admitting: *Deleted

## 2015-06-17 DIAGNOSIS — R748 Abnormal levels of other serum enzymes: Secondary | ICD-10-CM

## 2015-06-20 ENCOUNTER — Encounter: Payer: Self-pay | Admitting: Family Medicine

## 2015-09-09 ENCOUNTER — Other Ambulatory Visit: Payer: Self-pay | Admitting: Family Medicine

## 2015-09-10 ENCOUNTER — Telehealth: Payer: Self-pay | Admitting: Family Medicine

## 2015-09-10 NOTE — Telephone Encounter (Signed)
Called pt and gave her Dr. Gardiner Ramus recommendations she stated that she would like to discuss this at her visit and have her labs done before making a decision regarding restarting.Terri Zimmerman

## 2015-09-10 NOTE — Telephone Encounter (Signed)
Please call patient: We really need to get her on a statin because of her diagnosis of diabetes. I know she did not tolerate the Lipitor well. Would she be willing to try a different statin?

## 2015-09-27 ENCOUNTER — Encounter: Payer: Self-pay | Admitting: Family Medicine

## 2015-09-27 ENCOUNTER — Ambulatory Visit (INDEPENDENT_AMBULATORY_CARE_PROVIDER_SITE_OTHER): Payer: BLUE CROSS/BLUE SHIELD | Admitting: Family Medicine

## 2015-09-27 VITALS — BP 138/80 | HR 95 | Temp 98.4°F | Resp 18 | Wt 188.2 lb

## 2015-09-27 DIAGNOSIS — L659 Nonscarring hair loss, unspecified: Secondary | ICD-10-CM | POA: Diagnosis not present

## 2015-09-27 DIAGNOSIS — E118 Type 2 diabetes mellitus with unspecified complications: Secondary | ICD-10-CM

## 2015-09-27 DIAGNOSIS — I10 Essential (primary) hypertension: Secondary | ICD-10-CM | POA: Diagnosis not present

## 2015-09-27 DIAGNOSIS — Z794 Long term (current) use of insulin: Secondary | ICD-10-CM | POA: Diagnosis not present

## 2015-09-27 DIAGNOSIS — Z23 Encounter for immunization: Secondary | ICD-10-CM | POA: Diagnosis not present

## 2015-09-27 DIAGNOSIS — R748 Abnormal levels of other serum enzymes: Secondary | ICD-10-CM | POA: Diagnosis not present

## 2015-09-27 LAB — POCT GLYCOSYLATED HEMOGLOBIN (HGB A1C): Hemoglobin A1C: 7.4

## 2015-09-27 MED ORDER — DULAGLUTIDE 0.75 MG/0.5ML ~~LOC~~ SOAJ: 0.7500 mg | Freq: Every day | SUBCUTANEOUS | Status: DC

## 2015-09-27 NOTE — Progress Notes (Signed)
   Subjective:    Patient ID: Terri Zimmerman, female    DOB: 1966-08-20, 49 y.o.   MRN: [id number]  HPI Hypertension- Pt denies chest pain, SOB, dizziness, or heart palpitations.  Taking meds as directed w/o problems.  Denies medication side effects.  She reports that her blood pressures at other appointments have been fantastic.  Diabetes - no hypoglycemic events. No wounds or sores that are not healing well. No increased thirst or urination. Checking glucose at home. Taking medications as prescribed without any side effects. She is currently on Kombiglyze and Lantus, 62 units daily.  She has a new job she started 3 weeks. Stress levels have been up since then.  Patchy hair loss-she felt like the Lipitor was causing some hair loss. Since stopping the medication and using the topical clobetasol she has started to notice some new hair growth in those areas    Review of Systems     Objective:   Physical Exam  Constitutional: She is oriented to person, place, and time. She appears well-developed and well-nourished.  HENT:  Head: Normocephalic and atraumatic.  Cardiovascular: Normal rate, regular rhythm and normal heart sounds.   Pulmonary/Chest: Effort normal and breath sounds normal.  Neurological: She is alert and oriented to person, place, and time.  Skin: Skin is warm and dry.  Psychiatric: She has a normal mood and affect. Her behavior is normal.          Assessment & Plan:  HTN- repeat blood pressure at goal. Continue current regimen. Follow carefully. Make sure working on diet and exercise.   DM- she is on an ACE inhibitor.  Discussed starting her on trulicity  start checking blood glucose at least every other day after starting the new medication. If she starts to notice any low blood sugars and please give [place] a call. If this does well we might even be able to decrease her insulin. Continue work on exercise and diet. She has lost 2 pounds which is fantastic. Foot exam performed  today.    Elevated liver enzymes seen on last set of blood work. Her labs were trending back down but wanted to check it again today to make sure they went completely back down to normal.  Patchy hair loss-improving.  Flu vaccine given. Marland Kitchen

## 2015-09-30 ENCOUNTER — Telehealth: Payer: Self-pay | Admitting: Family Medicine

## 2015-09-30 NOTE — Telephone Encounter (Signed)
Received fax for prior authorization on Trulicity sent through cover my meds and received authorization good from 09/30/2015 - 09/29/2016. Case ID [id number]. - CF

## 2015-10-24 ENCOUNTER — Other Ambulatory Visit: Payer: Self-pay | Admitting: Family Medicine

## 2015-10-25 ENCOUNTER — Emergency Department (INDEPENDENT_AMBULATORY_CARE_PROVIDER_SITE_OTHER): Payer: BLUE CROSS/BLUE SHIELD

## 2015-10-25 ENCOUNTER — Emergency Department (INDEPENDENT_AMBULATORY_CARE_PROVIDER_SITE_OTHER)
Admission: EM | Admit: 2015-10-25 | Discharge: 2015-10-25 | Disposition: A | Discharge status: Home / Self Care | Payer: BLUE CROSS/BLUE SHIELD | Source: Home / Self Care | Attending: Family Medicine | Admitting: Family Medicine

## 2015-10-25 DIAGNOSIS — J209 Acute bronchitis, unspecified: Secondary | ICD-10-CM

## 2015-10-25 DIAGNOSIS — J219 Acute bronchiolitis, unspecified: Secondary | ICD-10-CM

## 2015-10-25 MED ORDER — AZITHROMYCIN 250 MG PO TABS: ORAL_TABLET | ORAL | Status: DC

## 2015-10-25 MED ORDER — PREDNISONE 20 MG PO TABS: 20.0000 mg | ORAL_TABLET | Freq: Two times a day (BID) | ORAL | Status: DC

## 2015-10-25 MED ORDER — BENZONATATE 200 MG PO CAPS: 200.0000 mg | ORAL_CAPSULE | Freq: Every day | ORAL | Status: DC

## 2015-10-25 NOTE — ED Provider Notes (Signed)
CSN: [id number]     Arrival date & time 10/25/15  1623 History   First MD Initiated Contact with Patient 10/25/15 1642     Chief Complaint  Patient presents with  . Cough      HPI Comments: About 3 weeks ago patient developed typical cold-like symptoms including mild sore throat, sinus congestion, headache, fatigue, low grade fever, and cough.  Yesterday her cough increased, and she has developed recurrent scratchy throat and congestion. She continues to smoke.     Past Medical History  Diagnosis Date  . Hypertension 11-04-06  . Asthma 04-25-07  . Diabetes mellitus 11-12-06    type 2   Past Surgical History  Procedure Laterality Date  . Breast surgery  4-00    of cyst  . Appendectomy  9-00  . Laparoscopy  4-08    for cyst and endometriosis  . Left breast bx  12-11  . Abdominal hysterectomy  12-11    Complete  . Foot tendon surgery  05/16/2012    bone spur.     Family History  Problem Relation Age of Onset  . Other Mother     RA and CHF  . Diabetes Father   . Diabetes Other   . Heart disease Other    Social History  Substance Use Topics  . Smoking status: Current Every Day Smoker -- 0.50 packs/day    Types: Cigarettes  . Smokeless tobacco: None  . Alcohol Use: No   OB History    No data available     Review of Systems + sore throat + hoarse + cough No pleuritic pain + wheezing + nasal congestion + post-nasal drainage No sinus pain/pressure No itchy/red eyes No earache No hemoptysis No SOB No fever/chills No nausea No vomiting No abdominal pain No diarrhea No urinary symptoms No skin rash + fatigue No myalgias + headache Used OTC meds without relief  Allergies  Wilder Glade; Invokana; Jentadueto; and Lipitor  Home Medications   Prior to Admission medications   Medication Sig Start Date End Date Taking? Authorizing Provider  AMBULATORY NON FORMULARY MEDICATION One Touch Ultra Test Strips.  Diagnosis: DM  Testing 2 times a day 11/01/12   Hali Marry, MD  azithromycin (ZITHROMAX Z-PAK) 250 MG tablet Take 2 tabs today; then begin one tab once daily for 4 more days. 10/25/15   Kandra Nicolas, MD  benzonatate (TESSALON) 200 MG capsule Take 1 capsule (200 mg total) by mouth at bedtime. Take as needed for cough 10/25/15   Kandra Nicolas, MD  clobetasol cream (TEMOVATE) AB-123456789 % Apply 1 application topically 2 (two) times daily. 03/01/15   Jade L Breeback, PA-C  Dulaglutide (TRULICITY) A999333 0000000 SOPN Inject 0.75 mg into the skin daily. 09/27/15   Hali Marry, MD  Insulin Glargine (LANTUS SOLOSTAR) 100 UNIT/ML Solostar Pen INJECT 65 UNITS SUBCUTANEOUSLY ONCE DAILY 06/14/15   Hali Marry, MD  lisinopril (PRINIVIL,ZESTRIL) 20 MG tablet take 1 tablet by mouth once daily 09/09/15   Hali Marry, MD  naproxen sodium (ANAPROX) 220 MG tablet Take 220 mg by mouth 2 (two) times daily with a meal. Patient used this medication for pain.    Historical Provider, MD  NON FORMULARY One Touch Ultra View test strips- 2 bottles     Historical Provider, MD  NON FORMULARY Glucometer, strips, and lancets to test daily DX:250.00     Historical Provider, MD  NOVOFINE 30G X 8 MM MISC use to inject 10 units INTO  THE SKIN as directed 10/24/15   Hali Marry, MD  predniSONE (DELTASONE) 20 MG tablet Take 1 tablet (20 mg total) by mouth 2 (two) times daily. Take with food. 10/25/15   Kandra Nicolas, MD  Saxagliptin-Metformin (KOMBIGLYZE XR) 03-999 MG TB24 Take 1 tablet by mouth daily. 06/14/15   Hali Marry, MD   Meds Ordered and Administered this Visit  Medications - No data to display  BP 169/97 mmHg  Pulse 117  Temp(Src) 97.6 F (36.4 C) (Oral)  Ht 5\' 1"  (1.549 m)  Wt 191 lb 12 oz (86.977 kg)  BMI 36.25 kg/m2  SpO2 98% No data found.   Physical Exam Nursing notes and Vital Signs reviewed. Appearance:  Patient appears stated age, and in no acute distress.  Patient is obese (BMI 36.3) Eyes:  Pupils are equal, round,  and reactive to light and accomodation.  Extraocular movement is intact.  Conjunctivae are not inflamed  Ears:  Canals normal.  Tympanic membranes normal.  Nose:  Mildly congested turbinates.  No sinus tenderness.     Pharynx:  Normal Neck:  Supple.  Tender enlarged posterior nodes are palpated bilaterally  Lungs:  Clear to auscultation.  Breath sounds are equal.  Moving air well. Heart:  Regular rate and rhythm without murmurs, rubs, or gallops.  Rate 120 Abdomen:  Nontender without masses or hepatosplenomegaly.  Bowel sounds are present.  No CVA or flank tenderness.  Extremities:  No edema.  No calf tenderness Skin:  No rash present.   ED Course  Procedures  None    Imaging Review Dg Chest 2 View  10/25/2015  CLINICAL DATA:  Smoker with 3 week history of cough. Current history of diabetes and hypertension. EXAM: CHEST  2 VIEW COMPARISON:  08/22/2009 and earlier. FINDINGS: Cardiomediastinal silhouette unremarkable, unchanged. Mildly prominent bronchovascular markings diffusely and mild central peribronchial thickening, more so than on the prior examination. Minimal linear scar or atelectasis in the lingula. Lungs otherwise clear. No localized airspace consolidation. No pleural effusions. No pneumothorax. Normal pulmonary vascularity. 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 IMPRESSION: Mild changes of acute bronchitis and/or asthma without focal airspace pneumonia. Electronically Signed   By: Evangeline Dakin M.D.   On: 10/25/2015 17:10      MDM   1. Acute bronchitis, unspecified organism (viral URI initially)    Begin Z-pak, and brief prednisone burst.  Prescription written for Benzonatate (Tessalon) to take at bedtime for night-time cough.  Take plain guaifenesin (1200mg  extended release tabs such as Mucinex) twice daily, with plenty of water, for cough and congestion. Get adequate rest.   May use Afrin nasal spray (or generic oxymetazoline)  twice daily for about 5 days and then discontinue.  Also recommend using saline nasal spray several times daily and saline nasal irrigation (AYR is a common brand).   Try warm salt water gargles for sore throat.  Stop all antihistamines for now, and other non-prescription cough/cold preparations. Follow-up with family doctor if not improving about one week.     Kandra Nicolas, MD 10/31/15 [phone]

## 2015-10-25 NOTE — ED Notes (Signed)
Started with a cold, stuffy nose, several weeks ago.  A cough and scratchy throat started a couple days ago.

## 2015-10-25 NOTE — Discharge Instructions (Signed)
Take plain guaifenesin (1200mg  extended release tabs such as Mucinex) twice daily, with plenty of water, for cough and congestion. Get adequate rest.   May use Afrin nasal spray (or generic oxymetazoline) twice daily for about 5 days and then discontinue.  Also recommend using saline nasal spray several times daily and saline nasal irrigation (AYR is a common brand).   Try warm salt water gargles for sore throat.  Stop all antihistamines for now, and other non-prescription cough/cold preparations. Follow-up with family doctor if not improving about one week.

## 2015-10-28 ENCOUNTER — Other Ambulatory Visit: Payer: Self-pay | Admitting: Family Medicine

## 2015-11-14 ENCOUNTER — Ambulatory Visit (INDEPENDENT_AMBULATORY_CARE_PROVIDER_SITE_OTHER): Payer: BLUE CROSS/BLUE SHIELD | Admitting: Osteopathic Medicine

## 2015-11-14 ENCOUNTER — Encounter: Payer: Self-pay | Admitting: Osteopathic Medicine

## 2015-11-14 VITALS — BP 150/83 | HR 106 | Temp 98.2°F | Ht 61.0 in | Wt 189.0 lb

## 2015-11-14 DIAGNOSIS — J0100 Acute maxillary sinusitis, unspecified: Secondary | ICD-10-CM

## 2015-11-14 DIAGNOSIS — IMO0001 Reserved for inherently not codable concepts without codable children: Secondary | ICD-10-CM

## 2015-11-14 DIAGNOSIS — R030 Elevated blood-pressure reading, without diagnosis of hypertension: Secondary | ICD-10-CM

## 2015-11-14 MED ORDER — IPRATROPIUM BROMIDE 0.03 % NA SOLN: 2.0000 | Freq: Two times a day (BID) | NASAL | Status: DC

## 2015-11-14 MED ORDER — AMOXICILLIN-POT CLAVULANATE 875-125 MG PO TABS: 1.0000 | ORAL_TABLET | Freq: Two times a day (BID) | ORAL | Status: DC

## 2015-11-14 NOTE — Progress Notes (Signed)
HPI: Terri Zimmerman is a 49 y.o. female who presents to [facility]  today for chief complaint of:  Chief Complaint  Patient presents with  . Cough   Sinuses and cough . Location: sinuses . Quality: pressure, headache, coughing  . Severity: moderate . Duration: over month . Context: yes sick contacts, no recent travel . Modifying factors: has tried the following OTC medications: Z-pack and prednisone, as well as Mucinex  without relief . Assoc signs/symptoms: yes fever/chills, no productive cough, No  body aches, No  GI upset   Past medical, social and family history reviewed: Past Medical History  Diagnosis Date  . Hypertension 11-04-06  . Asthma 04-25-07  . Diabetes mellitus 11-12-06    type 2   Past Surgical History  Procedure Laterality Date  . Breast surgery  4-00    of cyst  . Appendectomy  9-00  . Laparoscopy  4-08    for cyst and endometriosis  . Left breast bx  12-11  . Abdominal hysterectomy  12-11    Complete  . Foot tendon surgery  05/16/2012    bone spur.     Social History  Substance Use Topics  . Smoking status: Current Every Day Smoker -- 0.50 packs/day    Types: Cigarettes  . Smokeless tobacco: Not on file  . Alcohol Use: No   Family History  Problem Relation Age of Onset  . Other Mother     RA and CHF  . Diabetes Father   . Diabetes Other   . Heart disease Other     Current Outpatient Prescriptions  Medication Sig Dispense Refill  . AMBULATORY NON FORMULARY MEDICATION One Touch Ultra Test Strips.  Diagnosis: DM  Testing 2 times a day 100 each 5  . clobetasol cream (TEMOVATE) AB-123456789 % Apply 1 application topically 2 (two) times daily. 60 g 2  . Insulin Glargine (LANTUS SOLOSTAR) 100 UNIT/ML Solostar Pen INJECT 65 UNITS SUBCUTANEOUSLY ONCE DAILY 45 mL 1  . lisinopril (PRINIVIL,ZESTRIL) 20 MG tablet take 1 tablet by mouth once daily 90 tablet 1  . naproxen sodium (ANAPROX) 220 MG tablet Take 220 mg by mouth 2 (two)  times daily with a meal. Patient used this medication for pain.    . NON FORMULARY One Touch Ultra View test strips- 2 bottles     . NON FORMULARY Glucometer, strips, and lancets to test daily DX:250.00     . NOVOFINE 30G X 8 MM MISC use to inject 10 units INTO THE SKIN as directed 100 each 98  . Saxagliptin-Metformin (KOMBIGLYZE XR) 03-999 MG TB24 Take 1 tablet by mouth daily. 90 tablet 1  . TRULICITY A999333 0000000 SOPN inject 0.75 INTO THE SKIN every week 2 pen 2   No current facility-administered medications for this visit.   Allergies  Allergen Reactions  . Farxiga [Dapagliflozin]     foggy  . Invokana [Canagliflozin] Other (See Comments)    Back pain  . Jentadueto [Linagliptin-Metformin Hcl]     Abdominal pains and diarrhea  . Lipitor [Atorvastatin]     Sudden hair loss 2 weeks after starting lipitor.       Review of Systems: CONSTITUTIONAL: yes fever/chills HEAD/EYES/EARS/NOSE/THROAT: yes headache, no vision change or hearing change, no sore throat CARDIAC: No chest pain/pressure/palpitations, no orthopnea RESPIRATORY: Mild nonproductive cough, no shortness of breath GASTROINTESTINAL: no nausea, no vomiting, no abdominal pain/blood in stool/diarrhea/constipation MUSCULOSKELETAL: no myalgia/arthralgia   Exam:  BP 150/83 mmHg  Pulse 106  Temp(Src) 98.2 F (36.8 C) (Oral)  Ht 5\' 1"  (1.549 m)  Wt 189 lb (85.73 kg)  BMI 35.73 kg/m2 Constitutional: VSS, see above. General Appearance: alert, well-developed, well-nourished, NAD Eyes: Normal lids and conjunctive, non-icteric sclera, PERRLA Ears, Nose, Mouth, Throat: Normal external inspection ears/nares/mouth/lips/gums, normal TM, MMM; posterior pharynx without erythema, without exudate Neck: No masses, trachea midline. No thyroid enlargement/tenderness/mass, normal LN Respiratory: Normal respiratory effort. No  wheeze/rhonchi/rales Cardiovascular: S1/S2 normal, no murmur/rub/gallop auscultated. RRR. No carotid bruit or JVD.  No lower extremity edema.    ASSESSMENT/PLAN:   Acute maxillary sinusitis, recurrence not specified - Plan: amoxicillin-clavulanate (AUGMENTIN) 875-125 MG tablet, ipratropium (ATROVENT) 0.03 % nasal spray  Elevated BP - we'll hold off an adjustment of antihypertensives due to acute illness, patient is advised to make a follow-up appointment with Dr. Charise Carwin when she is feeling better, she is also advised to make an appointment with nurse to check blood pressure without a doctor visit  Return if symptoms worsen or fail to improve and as instructed for blood pressure followup.

## 2015-11-14 NOTE — Patient Instructions (Signed)
Return for visit w/ Dr. Madilyn Fireman or schedule a nurse visit when you are feeling better to recheck blood pressure

## 2015-12-03 ENCOUNTER — Other Ambulatory Visit: Payer: Self-pay | Admitting: Family Medicine

## 2015-12-19 ENCOUNTER — Ambulatory Visit (INDEPENDENT_AMBULATORY_CARE_PROVIDER_SITE_OTHER): Payer: BLUE CROSS/BLUE SHIELD

## 2015-12-19 ENCOUNTER — Ambulatory Visit (INDEPENDENT_AMBULATORY_CARE_PROVIDER_SITE_OTHER): Payer: BLUE CROSS/BLUE SHIELD | Admitting: Osteopathic Medicine

## 2015-12-19 VITALS — BP 141/92 | HR 103 | Temp 98.2°F | Wt 189.0 lb

## 2015-12-19 DIAGNOSIS — J309 Allergic rhinitis, unspecified: Secondary | ICD-10-CM

## 2015-12-19 DIAGNOSIS — R0989 Other specified symptoms and signs involving the circulatory and respiratory systems: Secondary | ICD-10-CM | POA: Diagnosis not present

## 2015-12-19 DIAGNOSIS — J029 Acute pharyngitis, unspecified: Secondary | ICD-10-CM | POA: Diagnosis not present

## 2015-12-19 DIAGNOSIS — R05 Cough: Secondary | ICD-10-CM

## 2015-12-19 DIAGNOSIS — H6981 Other specified disorders of Eustachian tube, right ear: Secondary | ICD-10-CM

## 2015-12-19 DIAGNOSIS — R053 Chronic cough: Secondary | ICD-10-CM

## 2015-12-19 DIAGNOSIS — H6991 Unspecified Eustachian tube disorder, right ear: Secondary | ICD-10-CM

## 2015-12-19 DIAGNOSIS — J329 Chronic sinusitis, unspecified: Secondary | ICD-10-CM

## 2015-12-19 LAB — POCT RAPID STREP A (OFFICE): Rapid Strep A Screen: NEGATIVE

## 2015-12-19 MED ORDER — DOXYCYCLINE MONOHYDRATE 50 MG PO CAPS: ORAL_CAPSULE | ORAL | Status: DC

## 2015-12-19 MED ORDER — MONTELUKAST SODIUM 10 MG PO TABS: 10.0000 mg | ORAL_TABLET | Freq: Every day | ORAL | Status: DC

## 2015-12-19 MED ORDER — ALBUTEROL SULFATE HFA 108 (90 BASE) MCG/ACT IN AERS: 2.0000 | INHALATION_SPRAY | Freq: Four times a day (QID) | RESPIRATORY_TRACT | Status: DC | PRN

## 2015-12-19 NOTE — Progress Notes (Signed)
HPI: Terri Zimmerman is a 50 y.o. female who presents to [facility]  today for chief complaint of:  Chief Complaint  Patient presents with  . Sore Throat  . Ear Pain    right    . Location/Quality: sinuses pressure, sore throat, R ear pain  . Duration: sore throat and new pear pain started today, cough and crud has been going on w/o change "few months"   . Context:seen/treated for sinusitis 11/14/15 (5 weeks ago) by myself. New job started in 07/2015 with exposure to dust, dye, ?respiratory exposure. Never had CT sinuses  . Modifying factors: has tried the following OTC medications: none without relief  Past medical, social and family history reviewed: Past Medical History  Diagnosis Date  . Hypertension 11-04-06  . Asthma 04-25-07  . Diabetes mellitus 11-12-06    type 2   Past Surgical History  Procedure Laterality Date  . Breast surgery  4-00    of cyst  . Appendectomy  9-00  . Laparoscopy  4-08    for cyst and endometriosis  . Left breast bx  12-11  . Abdominal hysterectomy  12-11    Complete  . Foot tendon surgery  05/16/2012    bone spur.     Social History  Substance Use Topics  . Smoking status: Current Every Day Smoker -- 0.50 packs/day    Types: Cigarettes  . Smokeless tobacco: Not on file  . Alcohol Use: No   Family History  Problem Relation Age of Onset  . Other Mother     RA and CHF  . Diabetes Father   . Diabetes Other   . Heart disease Other     Current Outpatient Prescriptions  Medication Sig Dispense Refill  . AMBULATORY NON FORMULARY MEDICATION One Touch Ultra Test Strips.  Diagnosis: DM  Testing 2 times a day 100 each 5  . amoxicillin-clavulanate (AUGMENTIN) 875-125 MG tablet Take 1 tablet by mouth 2 (two) times daily. 10 tablet 0  . clobetasol cream (TEMOVATE) AB-123456789 % Apply 1 application topically 2 (two) times daily. 60 g 2  . ipratropium (ATROVENT) 0.03 % nasal spray Place 2 sprays into both nostrils every 12  (twelve) hours. 30 mL 0  . LANTUS SOLOSTAR 100 UNIT/ML Solostar Pen INJECT 65 UNITS SUBCUTANEOUSLY ONCE DAILY 45 mL 1  . lisinopril (PRINIVIL,ZESTRIL) 20 MG tablet take 1 tablet by mouth once daily 90 tablet 1  . naproxen sodium (ANAPROX) 220 MG tablet Take 220 mg by mouth 2 (two) times daily with a meal. Patient used this medication for pain.    . NON FORMULARY One Touch Ultra View test strips- 2 bottles     . NON FORMULARY Glucometer, strips, and lancets to test daily DX:250.00     . NOVOFINE 30G X 8 MM MISC use to inject 10 units INTO THE SKIN as directed 100 each 98  . Saxagliptin-Metformin (KOMBIGLYZE XR) 03-999 MG TB24 Take 1 tablet by mouth daily. 90 tablet 1  . TRULICITY A999333 0000000 SOPN inject 0.75 INTO THE SKIN every week 2 pen 2   No current facility-administered medications for this visit.   Allergies  Allergen Reactions  . Farxiga [Dapagliflozin]     foggy  . Invokana [Canagliflozin] Other (See Comments)    Back pain  . Jentadueto [Linagliptin-Metformin Hcl]     Abdominal pains and diarrhea  . Lipitor [Atorvastatin]     Sudden hair loss 2 weeks after starting lipitor.  Review of Systems: CONSTITUTIONAL: no fever/chills HEAD/EYES/EARS/NOSE/THROAT: no headache, no vision change or hearing change, yes sore throat CARDIAC: No chest pain/pressure/palpitations, no orthopnea RESPIRATORY: yes cough, no shortness of breath, occsaional wheeze GASTROINTESTINAL: no nausea, no vomiting, no abdominal pain/blood in stool/diarrhea/constipation MUSCULOSKELETAL: no myalgia/arthralgia   Exam:  BP 151/89 mmHg  Pulse 114  Temp(Src) 98.2 F (36.8 C) (Oral)  Wt 189 lb (85.73 kg) Constitutional: VSS, see above. General Appearance: alert, well-developed, well-nourished, NAD Eyes: Normal lids and conjunctive, non-icteric sclera, PERRLA Ears, Nose, Mouth, Throat: Normal external inspection ears/nares/mouth/lips/gums, normal TM, MMM; posterior pharynx without erythema, without  exudate Neck: No masses, trachea midline. No thyroid enlargement/tenderness/mass appreciated, normal lymph nodes Respiratory: Normal respiratory effort. No  wheeze/rhonchi/rales Cardiovascular: S1/S2 normal, no murmur/rub/gallop auscultated. RRR. No carotid bruit or JVD. No lower extremity edema.   Results for orders placed or performed in visit on 12/19/15 (from the past 72 hour(s))  POCT rapid strep A     Status: None   Collection Time: 12/19/15  2:09 PM  Result Value Ref Range   Rapid Strep A Screen Negative Negative      ASSESSMENT/PLAN: Ddx includes cough variant asthma, recurrent sinusitis with underlying anatomic abnormality, respiratory irritant exposure at work, allergy, viral infection. CT Sinus and CXR for w/u, consider PFT. Try Albuterol prn, start Singulair, will escalate abx therapy with Doxy. BP improved on recheck but needs to follow up with Dr Madilyn Fireman, not going to adjust meds today given acute illness.   Sore throat - Plan: POCT rapid strep A  Persistent cough - Plan: albuterol (PROVENTIL HFA;VENTOLIN HFA) 108 (90 Base) MCG/ACT inhaler, DG Chest 2 View  Recurrent sinusitis - Plan: doxycycline (MONODOX) 50 MG capsule, CT sinus facial bones with and without contrast  Eustachian tube dysfunction, right  Allergic rhinitis, unspecified allergic rhinitis type - Plan: montelukast (SINGULAIR) 10 MG tablet   Return in about 2 weeks (around 01/02/2016) for follow up on todays symptoms, discuss further workup if needed. Marland Kitchen

## 2015-12-19 NOTE — Patient Instructions (Signed)
Given the persistence of your symptoms, there are a few possible causes we need to explore:   COUGH  1. Cough variant asthma - particularly given your recent change in work environment - try Albuterol inhaler as needed for wheezing/shortness of breath, try pre-medicating with this before going to work. I would recommend following up with Dr Madilyn Fireman to discus repeating lung function tests.   2. Persistent cough from other causes - we will see what the chest Xray shows. Cough can also happen in people with heartburn or, in your case, sinus drainage (see below).    SINUS PRESSURE, EAR PAIN  3. Sou may be feeling worse right now due to viral illness on top of a chronic problem. You may also have a recurrent bacterial infection. Try taking the Doxycycline, this is a stronger antibiotic. Take it with food!   4. We will also get CT of the sinuses to look for any other infection or polyps or something else that can explain your symptoms.   5. Allergies - try taking the Singulair (montelukast), or can use over-the-counter steroid nasal spray such as Flonase (fluticasone)      FOLLOW-UP WITH DR. Madilyn Fireman IN THE NEXT 2 WEEKS.

## 2015-12-30 ENCOUNTER — Other Ambulatory Visit: Payer: Self-pay | Admitting: Family Medicine

## 2016-01-06 ENCOUNTER — Ambulatory Visit (INDEPENDENT_AMBULATORY_CARE_PROVIDER_SITE_OTHER): Payer: BLUE CROSS/BLUE SHIELD | Admitting: Family Medicine

## 2016-01-06 DIAGNOSIS — I10 Essential (primary) hypertension: Secondary | ICD-10-CM

## 2016-01-06 DIAGNOSIS — R590 Localized enlarged lymph nodes: Secondary | ICD-10-CM | POA: Diagnosis not present

## 2016-01-06 DIAGNOSIS — Z794 Long term (current) use of insulin: Secondary | ICD-10-CM

## 2016-01-06 DIAGNOSIS — E118 Type 2 diabetes mellitus with unspecified complications: Secondary | ICD-10-CM | POA: Diagnosis not present

## 2016-01-06 DIAGNOSIS — R002 Palpitations: Secondary | ICD-10-CM

## 2016-01-06 DIAGNOSIS — B370 Candidal stomatitis: Secondary | ICD-10-CM

## 2016-01-06 LAB — POCT GLYCOSYLATED HEMOGLOBIN (HGB A1C): HEMOGLOBIN A1C: 6.8

## 2016-01-06 MED ORDER — METOPROLOL SUCCINATE ER 25 MG PO TB24: 25.0000 mg | ORAL_TABLET | Freq: Every day | ORAL | Status: DC

## 2016-01-06 MED ORDER — NYSTATIN 100000 UNIT/ML MT SUSP: 5.0000 mL | Freq: Four times a day (QID) | OROMUCOSAL | Status: DC

## 2016-01-06 NOTE — Progress Notes (Signed)
   Subjective:    Patient ID: Terri Zimmerman, female    DOB: 1966/09/30, 50 y.o.   MRN: [id number]  HPI Diabetes - no hypoglycemic events. No wounds or sores that are not healing well. No increased thirst or urination. Checking glucose at home. Taking medications as prescribed without any side effects.  Hypertension- Pt denies chest pain, SOB, dizziness, or heart palpitations.  Taking meds as directed w/o problems.  Denies medication side effects.    Having more symptoms of a racing heart . Can last for up to 2 weeks.  Says really triggered by caffeine.  Had heart checked out in 2009 by Cardiology. Did a heart monitor that was essentially negative. He denies any chest pain or diaphoresis with this. Typically very brief.  Unfortunately she's been to our office 3 times in the last couple of months. She's been treated for bronchitis, sinusitis, and upper respiratory infection. She's been on several rounds of antibiotics. She did feel much better after this last round of doxycycline but now feels like she is getting some soreness on the right side of her throat and feels like there is a coating on her tongue as well as some slight discomfort and pain with swallowing. She's also noticed a swollen right lymph node on the right side of her neck.  Review of Systems     Objective:   Physical Exam  Constitutional: She is oriented to person, place, and time. She appears well-developed and well-nourished.  HENT:  Head: Normocephalic and atraumatic.  Right Ear: External ear normal.  Left Ear: External ear normal.  Nose: Nose normal.  Mouth/Throat: Oropharynx is clear and moist.  TMs and canals are clear. She has a thickened white coating on her tongue. A little difficult to tell if it shows hypertrophy of the papilla versus some early thrush.  Eyes: Conjunctivae and EOM are normal. Pupils are equal, round, and reactive to light.  Neck: Neck supple. No thyromegaly present.    Cardiovascular: Normal  rate, regular rhythm and normal heart sounds.   Pulmonary/Chest: Effort normal and breath sounds normal. She has no wheezes.  Lymphadenopathy:    She has no cervical adenopathy.  Neurological: She is alert and oriented to person, place, and time.  Skin: Skin is warm and dry.  Psychiatric: She has a normal mood and affect.          Assessment & Plan:  DM- well controlled. A1C is down to 6.8. Great job. Continue current regimen. Follow-up in 3 months.  Hypertension but still not at goal. Discussed options of diuretic versus adding beta blocker. I think a beta blocker would be a good choice to help control her palpitations. Follow-up in 4-6 weeks.  Palpitations - prior history of palpitations they just been flaring more recently. Did discuss with her avoiding caffeine as this is a huge trigger. She had a heart monitor back in 2009. We'll check a CBC to rule out anemia as well as check her thyroid. Recommend a trial of a beta blocker. If not helpful or palpitations continue or become more frequent and recommend repeating heart monitor.  Right lymphadenopathy - the lymph node hopefully will improve over the next few weeks. If it persists or gets larger then please let me know. We'll check a CBC as well.  Possible thrush-we'll treat with nystatin swish and swallow.  Colon cancer screening-discussed options. She will think about it and let me know.

## 2016-01-07 ENCOUNTER — Encounter: Payer: Self-pay | Admitting: Family Medicine

## 2016-01-07 LAB — CBC WITH DIFFERENTIAL/PLATELET
BASOS ABS: 0 10*3/uL (ref 0.0–0.1)
Basophils Relative: 0 % (ref 0–1)
EOS PCT: 1 % (ref 0–5)
Eosinophils Absolute: 0.1 10*3/uL (ref 0.0–0.7)
HEMATOCRIT: 44.4 % (ref 36.0–46.0)
Hemoglobin: 14.5 g/dL (ref 12.0–15.0)
LYMPHS PCT: 33 % (ref 12–46)
Lymphs Abs: 4.6 10*3/uL — ABNORMAL HIGH (ref 0.7–4.0)
MCH: 27.8 pg (ref 26.0–34.0)
MCHC: 32.7 g/dL (ref 30.0–36.0)
MCV: 85.2 fL (ref 78.0–100.0)
MPV: 9.9 fL (ref 8.6–12.4)
Monocytes Absolute: 0.7 10*3/uL (ref 0.1–1.0)
Monocytes Relative: 5 % (ref 3–12)
NEUTROS PCT: 61 % (ref 43–77)
Neutro Abs: 8.4 10*3/uL — ABNORMAL HIGH (ref 1.7–7.7)
PLATELETS: 301 10*3/uL (ref 150–400)
RBC: 5.21 MIL/uL — AB (ref 3.87–5.11)
RDW: 13.7 % (ref 11.5–15.5)
WBC: 13.8 10*3/uL — AB (ref 4.0–10.5)

## 2016-01-07 LAB — COMPLETE METABOLIC PANEL WITH GFR
ALBUMIN: 4.1 g/dL (ref 3.6–5.1)
ALT: 38 U/L — AB (ref 6–29)
AST: 31 U/L (ref 10–35)
Alkaline Phosphatase: 89 U/L (ref 33–130)
BUN: 7 mg/dL (ref 7–25)
CHLORIDE: 102 mmol/L (ref 98–110)
CO2: 29 mmol/L (ref 20–31)
Calcium: 9.9 mg/dL (ref 8.6–10.4)
Creat: 0.68 mg/dL (ref 0.50–1.05)
GFR, Est African American: >89 mL/min (ref >=60)
GFR, Est Non African American: >89 mL/min (ref >=60)
GLUCOSE: 140 mg/dL — AB (ref 65–99)
POTASSIUM: 3.8 mmol/L (ref 3.5–5.3)
SODIUM: 139 mmol/L (ref 135–146)
Total Bilirubin: 0.4 mg/dL (ref 0.2–1.2)
Total Protein: 6.7 g/dL (ref 6.1–8.1)

## 2016-01-07 LAB — TSH: TSH: 2.37 mIU/L

## 2016-01-07 LAB — FERRITIN: Ferritin: 67 ng/mL (ref 10–232)

## 2016-01-08 ENCOUNTER — Telehealth: Payer: Self-pay | Admitting: *Deleted

## 2016-01-08 NOTE — Telephone Encounter (Signed)
Pt called and wanted to know what would be best for her to take for Allergies that she can get OTC.

## 2016-01-24 ENCOUNTER — Encounter: Payer: Self-pay | Admitting: Family Medicine

## 2016-01-24 ENCOUNTER — Ambulatory Visit (INDEPENDENT_AMBULATORY_CARE_PROVIDER_SITE_OTHER): Payer: BLUE CROSS/BLUE SHIELD | Admitting: Family Medicine

## 2016-01-24 VITALS — BP 124/85 | HR 101 | Ht 61.0 in | Wt 185.0 lb

## 2016-01-24 DIAGNOSIS — Z114 Encounter for screening for human immunodeficiency virus [HIV]: Secondary | ICD-10-CM | POA: Diagnosis not present

## 2016-01-24 DIAGNOSIS — M542 Cervicalgia: Secondary | ICD-10-CM

## 2016-01-24 DIAGNOSIS — Z0000 Encounter for general adult medical examination without abnormal findings: Secondary | ICD-10-CM | POA: Diagnosis not present

## 2016-01-24 LAB — LIPID PANEL
Cholesterol: 159 mg/dL (ref 125–200)
HDL: 30 mg/dL — ABNORMAL LOW (ref >=46)
LDL CALC: 99 mg/dL (ref <130)
Total CHOL/HDL Ratio: 5.3 Ratio — ABNORMAL HIGH (ref <=5.0)
Triglycerides: 149 mg/dL (ref <150)
VLDL: 30 mg/dL (ref <30)

## 2016-01-24 LAB — HM DIABETES EYE EXAM

## 2016-01-24 NOTE — Progress Notes (Signed)
  Subjective:     Terri Zimmerman is a 50 y.o. female and is here for a comprehensive physical exam. The patient reports no problems.  Social History   Social History  . Marital Status: Married    Spouse Name: N/A  . Number of Children: N/A  . Years of Education: N/A   Occupational History  . Not on file.   Social History Main Topics  . Smoking status: Current Every Day Smoker -- 0.50 packs/day    Types: Cigarettes  . Smokeless tobacco: Not on file  . Alcohol Use: No  . Drug Use: No  . Sexual Activity: Not on file   Other Topics Concern  . Not on file   Social History Narrative   Health Maintenance  Topic Date Due  . HIV Screening  12/19/1980  . COLONOSCOPY  12/20/2015  . OPHTHALMOLOGY EXAM  02/28/2016  . INFLUENZA VACCINE  06/16/2016  . HEMOGLOBIN A1C  07/05/2016  . FOOT EXAM  09/26/2016  . TETANUS/TDAP  10/16/2016  . MAMMOGRAM  06/12/2017  . PNEUMOCOCCAL POLYSACCHARIDE VACCINE  Completed    The following portions of the patient's history were reviewed and updated as appropriate: allergies, current medications, past family history, past medical history, past social history, past surgical history and problem list.  Review of Systems Pertinent items noted in HPI and remainder of comprehensive ROS otherwise negative.   Objective:    BP 124/85 mmHg  Pulse 101  Ht 5\' 1"  (1.549 m)  Wt 185 lb (83.915 kg)  BMI 34.97 kg/m2  SpO2 99% General appearance: alert, cooperative and appears stated age Head: Normocephalic, without obvious abnormality, atraumatic Eyes: conj clear, EOMI, PEERLA Ears: normal TM's and external ear canals both ears Nose: Nares normal. Septum midline. Mucosa normal. No drainage or sinus tenderness. Throat: lips, mucosa, and tongue normal; teeth and gums normal Neck: no adenopathy, no carotid bruit, no JVD, supple, symmetrical, trachea midline and thyroid not enlarged, symmetric, no tenderness/mass/nodules Back: symmetric, no curvature. ROM normal.  No CVA tenderness. Lungs: clear to auscultation bilaterally Breasts: normal appearance, no masses or tenderness Heart: regular rate and rhythm, S1, S2 normal, no murmur, click, rub or gallop Abdomen: soft, non-tender; bowel sounds normal; no masses,  no organomegaly Pelvic: deferred Extremities: extremities normal, atraumatic, no cyanosis or edema Pulses: 2+ and symmetric Skin: Skin color, texture, turgor normal. No rashes or lesions, some skin tags under the axilla Lymph nodes: Cervical, supraclavicular, and axillary nodes normal. Neurologic: Alert and oriented X 3, normal strength and tone. Normal symmetric reflexes. Normal coordination and gait    Assessment:    Healthy female exam.      Plan:     See After Visit Summary for Counseling Recommendations  Keep up a regular exercise program and make sure you are eating a healthy diet Try to eat 4 servings of dairy a day, or if you are lactose intolerant take a calcium with vitamin D daily.  Your vaccines are up to date.  Cologuard form signed.  Here eye exam is scheduled for later today.

## 2016-01-25 LAB — HIV ANTIBODY (ROUTINE TESTING W REFLEX): HIV: NONREACTIVE

## 2016-01-27 ENCOUNTER — Telehealth: Payer: Self-pay | Admitting: *Deleted

## 2016-01-27 NOTE — Telephone Encounter (Signed)
Called insurance co to initiate a PA for kombiglyze last Friday and today. Received a letter from insurance co stating that April 1,2017 they will no longer cover this medication. Called to request exception and was on hold for 30 min both Friday and today. Will try again later

## 2016-01-28 ENCOUNTER — Other Ambulatory Visit: Payer: Self-pay | Admitting: Family Medicine

## 2016-02-01 ENCOUNTER — Other Ambulatory Visit: Payer: Self-pay | Admitting: Family Medicine

## 2016-02-05 NOTE — Telephone Encounter (Signed)
Your request has been approved  CaseId:[id number];Product Name:[id number]) Januvia, Janumet, Janumet XR, Jentadueto, Juvisync, [place], Kombiglyze XR, [place], [place], [place], [place] and Preferred;Status:Approved;Coverage Start Date:02/11/2015;Coverage End Date:02/11/2016;   Pt and pharmacy notified

## 2016-02-06 ENCOUNTER — Telehealth: Payer: Self-pay | Admitting: *Deleted

## 2016-02-06 NOTE — Telephone Encounter (Signed)
Pt states since she has been on the metoprolol 25 mg  she has muscle and joint pain, left leg and left had pain, her feet and legs are swollen and her legs itch. Please advise

## 2016-02-06 NOTE — Telephone Encounter (Signed)
Ok to stop the medication and see if sxs resolved. If she is feeling better in a couple weeks then have her please call us back and let us know so that we can add it to her intolerance list and maybe try to find something in its place.

## 2016-02-06 NOTE — Telephone Encounter (Signed)
Pt notified and voiced understanding 

## 2016-02-15 LAB — COLOGUARD: Cologuard: NEGATIVE

## 2016-02-18 ENCOUNTER — Telehealth: Payer: Self-pay | Admitting: Family Medicine

## 2016-02-18 NOTE — Telephone Encounter (Signed)
Call pt: Cologuard neg. Repeat colon cancer screening in 3 years.

## 2016-02-19 NOTE — Telephone Encounter (Signed)
lvm w/results and recommendations.Terri Zimmerman  

## 2016-02-20 ENCOUNTER — Encounter: Payer: Self-pay | Admitting: Sports Medicine

## 2016-02-20 ENCOUNTER — Ambulatory Visit (INDEPENDENT_AMBULATORY_CARE_PROVIDER_SITE_OTHER): Payer: BLUE CROSS/BLUE SHIELD | Admitting: Sports Medicine

## 2016-02-20 VITALS — BP 126/83 | HR 112 | Wt 189.7 lb

## 2016-02-20 DIAGNOSIS — M654 Radial styloid tenosynovitis [de Quervain]: Secondary | ICD-10-CM | POA: Diagnosis not present

## 2016-02-20 NOTE — Progress Notes (Signed)
   Subjective:    I'm seeing this patient as a consultation for:   Dr. Beatrice Lecher  CC:  Left wrist pain  HPI: For a month this pleasant 50 year old female has had severe pain that she localizes over the first extensor compartment the left wrist, persistent, with radiation to the mid forearm, no paresthesias, no neck pain. She has not tried bracing, splinting, over-the-counter analgesics have been ineffective.  Past medical history, Surgical history, Family history not pertinant except as noted below, Social history, Allergies, and medications have been entered into the medical record, reviewed, and no changes needed.   Review of Systems: No headache, visual changes, nausea, vomiting, diarrhea, constipation, dizziness, abdominal pain, skin rash, fevers, chills, night sweats, weight loss, swollen lymph nodes, body aches, joint swelling, muscle aches, chest pain, shortness of breath, mood changes, visual or auditory hallucinations.   Objective:   General: Well Developed, well nourished, and in no acute distress.  Neuro/Psych: Alert and oriented x3, extra-ocular muscles intact, able to move all 4 extremities, sensation grossly intact. Skin: Warm and dry, no rashes noted.  Respiratory: Not using accessory muscles, speaking in full sentences, trachea midline.  Cardiovascular: Pulses palpable, no extremity edema. Abdomen: Does not appear distended.  Left Wrist: Inspection normal with no visible erythema or swelling. ROM smooth and normal with good flexion and extension and ulnar/radial deviation that is symmetrical with opposite wrist. Palpation is normal over metacarpals, navicular, lunate, and TFCC; tendons without tenderness/ swelling No snuffbox tenderness. No tenderness over Canal of Gu[place]. Strength 5/5 in all directions without pain.  tender to palpation over the radial styloid process, positive Finkelstein sign, negative Tinel's and Phalen signs.. Negative Watson's  test.  Procedure: Real-time Ultrasound Guided Injection of  Left first extensor compartment Device: GE Logiq E  Verbal informed consent obtained.  Time-out conducted.  Noted no overlying erythema, induration, or other signs of local infection.  Skin prepped in a sterile fashion.  Local anesthesia: Topical Ethyl chloride.  With sterile technique and under real time ultrasound guidance:   25-gauge needle advanced between the extensor pollicis longus and the abductor pollicis brevis, 1 mL kenalog 40, 1 mL lidocaine, 1 mL Marcaine injected easily. Completed without difficulty  Pain immediately resolved suggesting accurate placement of the medication.  Advised to call if fevers/chills, erythema, induration, drainage, or persistent bleeding.  Images permanently stored and available for review in the ultrasound unit.  Impression: Technically successful ultrasound guided injection.  Impression and Recommendations:   This case required medical decision making of moderate complexity.

## 2016-02-20 NOTE — Assessment & Plan Note (Signed)
First extensor compartment injection as above, thumb spica splint, return to see me in one month.

## 2016-02-26 ENCOUNTER — Other Ambulatory Visit: Payer: Self-pay | Admitting: Family Medicine

## 2016-02-27 ENCOUNTER — Encounter: Payer: Self-pay | Admitting: Family Medicine

## 2016-02-27 ENCOUNTER — Other Ambulatory Visit: Payer: Self-pay | Admitting: Family Medicine

## 2016-03-09 ENCOUNTER — Telehealth: Payer: Self-pay | Admitting: *Deleted

## 2016-03-09 NOTE — Telephone Encounter (Signed)
Pharmacy states Kombiglyze is showing not approved in the system. Called insurance co and mediation is approved   from 03/09/2016 through 03/09/2017 case ID [id number].  Pt and pharm notified

## 2016-03-19 ENCOUNTER — Ambulatory Visit: Payer: BLUE CROSS/BLUE SHIELD | Admitting: Sports Medicine

## 2016-04-01 ENCOUNTER — Other Ambulatory Visit: Payer: Self-pay | Admitting: Family Medicine

## 2016-04-23 ENCOUNTER — Other Ambulatory Visit: Payer: Self-pay | Admitting: Family Medicine

## 2016-04-24 ENCOUNTER — Other Ambulatory Visit: Payer: Self-pay | Admitting: Family Medicine

## 2016-04-26 ENCOUNTER — Other Ambulatory Visit: Payer: Self-pay | Admitting: Family Medicine

## 2016-05-22 ENCOUNTER — Ambulatory Visit (INDEPENDENT_AMBULATORY_CARE_PROVIDER_SITE_OTHER): Payer: BLUE CROSS/BLUE SHIELD | Admitting: Family Medicine

## 2016-05-22 VITALS — BP 118/78 | HR 107 | Wt 189.0 lb

## 2016-05-22 DIAGNOSIS — E118 Type 2 diabetes mellitus with unspecified complications: Secondary | ICD-10-CM | POA: Diagnosis not present

## 2016-05-22 DIAGNOSIS — R221 Localized swelling, mass and lump, neck: Secondary | ICD-10-CM | POA: Diagnosis not present

## 2016-05-22 DIAGNOSIS — I10 Essential (primary) hypertension: Secondary | ICD-10-CM | POA: Diagnosis not present

## 2016-05-22 LAB — POCT UA - MICROALBUMIN
CREATININE, POC: 50 mg/dL
MICROALBUMIN (UR) POC: 10 mg/L

## 2016-05-22 LAB — POCT GLYCOSYLATED HEMOGLOBIN (HGB A1C): HEMOGLOBIN A1C: 6.9

## 2016-05-22 MED ORDER — DULAGLUTIDE 1.5 MG/0.5ML ~~LOC~~ SOAJ: 1.5000 mg | SUBCUTANEOUS | Status: DC

## 2016-05-22 MED ORDER — SAXAGLIPTIN-METFORMIN ER 5-1000 MG PO TB24: ORAL_TABLET | ORAL | Status: DC

## 2016-05-22 NOTE — Patient Instructions (Signed)
Increase Lantus to 65 units. I also increase the dose on her Trulicity.  Inject same way that you have been doing.

## 2016-05-22 NOTE — Progress Notes (Signed)
Subjective:    CC:   HPI: Diabetes - no hypoglycemic events. No wounds or sores that are not healing well. No increased thirst or urination. Checking glucose at home. Taking medications as prescribed without any side effects. She is currently on Trulicity, Kombiglyze, and Lantus.She's currently using 62 units. She does occasionally have some hot flashes. She says she plans on getting back on track with diet and exercise.  Hypertension- Pt denies chest pain, SOB, dizziness, or heart palpitations.  Taking meds as directed w/o problems.  Denies medication side effects.    She still has a lump on the right side of her neck. She says it's really not painful or bothersome. Occasionally get a little bit tender. She does feel like sometimes it swells a little bit more in another times it seems to go down but never completely goes away. It is been present for greater than 3 months. No fevers or chills.   Past medical history, Surgical history, Family history not pertinant except as noted below, Social history, Allergies, and medications have been entered into the medical record, reviewed, and corrections made.   Review of Systems: No fevers, chills, night sweats, weight loss, chest pain, or shortness of breath.   Objective:    General: Well Developed, well nourished, and in no acute distress.  Neuro: Alert and oriented x3, extra-ocular muscles intact, sensation grossly intact.  HEENT: Normocephalic, atraumatic. He does have a palpable approximately 1 x 2-7 m oval-shaped lesion on the right side of her neck. Not quite consistent with a lymph node. No induration or rash over the skin. It's nontender on exam today. Skin: Warm and dry, no rashes. Cardiac: Regular rate and rhythm, no murmurs rubs or gallops, no lower extremity edema.  Respiratory: Clear to auscultation bilaterally. Not using accessory muscles, speaking in full sentences.   Impression and Recommendations:    DM- Hemoglobin A1c of 6.9  today. Is up slightly from previous of 6.8. Urine Microalbumin collected today as well. Cursory get back on track with diet and exercise. Increase Lantus to 65 units. We'll also increased dose on Trulicity. Follow-up in 3 months. Continue work on weight loss.  HTN - Well controlled. Continue current regimen. Follow up in 6 months.    Nodule on the right side of neck-we'll schedule for ultrasound for further evaluation.

## 2016-05-23 LAB — BASIC METABOLIC PANEL
BUN: 6 mg/dL — AB (ref 7–25)
CALCIUM: 9 mg/dL (ref 8.6–10.4)
CO2: 25 mmol/L (ref 20–31)
CREATININE: 0.63 mg/dL (ref 0.50–1.05)
Chloride: 103 mmol/L (ref 98–110)
Glucose, Bld: 239 mg/dL — ABNORMAL HIGH (ref 65–99)
Potassium: 4.1 mmol/L (ref 3.5–5.3)
Sodium: 139 mmol/L (ref 135–146)

## 2016-05-25 ENCOUNTER — Other Ambulatory Visit: Payer: Self-pay | Admitting: Family Medicine

## 2016-05-25 DIAGNOSIS — Z1231 Encounter for screening mammogram for malignant neoplasm of breast: Secondary | ICD-10-CM

## 2016-05-25 NOTE — Progress Notes (Signed)
Quick Note:  All labs are normal. ______ 

## 2016-05-29 ENCOUNTER — Ambulatory Visit: Payer: BLUE CROSS/BLUE SHIELD

## 2016-05-29 DIAGNOSIS — R221 Localized swelling, mass and lump, neck: Secondary | ICD-10-CM

## 2016-05-29 DIAGNOSIS — E041 Nontoxic single thyroid nodule: Secondary | ICD-10-CM | POA: Diagnosis not present

## 2016-06-01 ENCOUNTER — Other Ambulatory Visit: Payer: Self-pay | Admitting: Family Medicine

## 2016-06-08 ENCOUNTER — Telehealth: Payer: Self-pay

## 2016-06-08 NOTE — Telephone Encounter (Signed)
Terri Zimmerman called and states the increase in Trulicity is causing her to have constipation then diarrhea and stomach cramps. She would like to go back now on the dose. Please advise.

## 2016-06-09 ENCOUNTER — Other Ambulatory Visit: Payer: Self-pay

## 2016-06-09 MED ORDER — DULAGLUTIDE 0.75 MG/0.5ML ~~LOC~~ SOAJ: 0.7500 mg | SUBCUTANEOUS | 2 refills | Status: DC

## 2016-06-09 MED ORDER — DULAGLUTIDE 0.75 MG/0.5ML ~~LOC~~ SOAJ: 0.7500 mg | Freq: Every day | SUBCUTANEOUS | 2 refills | Status: DC

## 2016-06-09 NOTE — Telephone Encounter (Signed)
Ok to refill lower trulicty dose. Should be on her old med list to refill.

## 2016-06-09 NOTE — Telephone Encounter (Signed)
Left a message advising of there lower dose and refill.

## 2016-06-14 ENCOUNTER — Encounter (HOSPITAL_BASED_OUTPATIENT_CLINIC_OR_DEPARTMENT_OTHER): Payer: Self-pay | Admitting: *Deleted

## 2016-06-14 ENCOUNTER — Emergency Department (HOSPITAL_BASED_OUTPATIENT_CLINIC_OR_DEPARTMENT_OTHER): Payer: BLUE CROSS/BLUE SHIELD

## 2016-06-14 ENCOUNTER — Emergency Department (HOSPITAL_BASED_OUTPATIENT_CLINIC_OR_DEPARTMENT_OTHER)
Admission: EM | Admit: 2016-06-14 | Discharge: 2016-06-15 | Disposition: A | Discharge status: Home / Self Care | Payer: BLUE CROSS/BLUE SHIELD | Attending: Emergency Medicine | Admitting: Emergency Medicine

## 2016-06-14 DIAGNOSIS — Z7984 Long term (current) use of oral hypoglycemic drugs: Secondary | ICD-10-CM | POA: Insufficient documentation

## 2016-06-14 DIAGNOSIS — F17210 Nicotine dependence, cigarettes, uncomplicated: Secondary | ICD-10-CM | POA: Insufficient documentation

## 2016-06-14 DIAGNOSIS — Z79899 Other long term (current) drug therapy: Secondary | ICD-10-CM | POA: Insufficient documentation

## 2016-06-14 DIAGNOSIS — E1165 Type 2 diabetes mellitus with hyperglycemia: Secondary | ICD-10-CM | POA: Diagnosis present

## 2016-06-14 DIAGNOSIS — M791 Myalgia: Secondary | ICD-10-CM | POA: Insufficient documentation

## 2016-06-14 DIAGNOSIS — R112 Nausea with vomiting, unspecified: Secondary | ICD-10-CM | POA: Diagnosis not present

## 2016-06-14 DIAGNOSIS — J45909 Unspecified asthma, uncomplicated: Secondary | ICD-10-CM | POA: Diagnosis not present

## 2016-06-14 DIAGNOSIS — I10 Essential (primary) hypertension: Secondary | ICD-10-CM | POA: Insufficient documentation

## 2016-06-14 DIAGNOSIS — Z794 Long term (current) use of insulin: Secondary | ICD-10-CM | POA: Diagnosis not present

## 2016-06-14 DIAGNOSIS — K760 Fatty (change of) liver, not elsewhere classified: Secondary | ICD-10-CM | POA: Diagnosis not present

## 2016-06-14 DIAGNOSIS — R1010 Upper abdominal pain, unspecified: Secondary | ICD-10-CM | POA: Diagnosis not present

## 2016-06-14 LAB — LIPASE, BLOOD: Lipase: 37 U/L (ref 11–51)

## 2016-06-14 LAB — CBC WITH DIFFERENTIAL/PLATELET
BASOS ABS: 0 10*3/uL (ref 0.0–0.1)
Basophils Relative: 0 %
EOS PCT: 1 %
Eosinophils Absolute: 0.1 10*3/uL (ref 0.0–0.7)
HCT: 41.4 % (ref 36.0–46.0)
Hemoglobin: 13.5 g/dL (ref 12.0–15.0)
LYMPHS PCT: 35 %
Lymphs Abs: 4.6 10*3/uL — ABNORMAL HIGH (ref 0.7–4.0)
MCH: 27.9 pg (ref 26.0–34.0)
MCHC: 32.6 g/dL (ref 30.0–36.0)
MCV: 85.5 fL (ref 78.0–100.0)
MONO ABS: 0.5 10*3/uL (ref 0.1–1.0)
MONOS PCT: 4 %
NEUTROS ABS: 7.8 10*3/uL — AB (ref 1.7–7.7)
Neutrophils Relative %: 60 %
PLATELETS: 242 10*3/uL (ref 150–400)
RBC: 4.84 MIL/uL (ref 3.87–5.11)
RDW: 13.2 % (ref 11.5–15.5)
WBC: 13.1 10*3/uL — ABNORMAL HIGH (ref 4.0–10.5)

## 2016-06-14 LAB — URINALYSIS, ROUTINE W REFLEX MICROSCOPIC
Bilirubin Urine: NEGATIVE
GLUCOSE, UA: NEGATIVE mg/dL
Hgb urine dipstick: NEGATIVE
Ketones, ur: 15 mg/dL — AB
LEUKOCYTES UA: NEGATIVE
NITRITE: NEGATIVE
PH: 6.5 (ref 5.0–8.0)
Protein, ur: NEGATIVE mg/dL
Specific Gravity, Urine: 1.023 (ref 1.005–1.030)

## 2016-06-14 LAB — COMPREHENSIVE METABOLIC PANEL
ALBUMIN: 3.7 g/dL (ref 3.5–5.0)
ALK PHOS: 86 U/L (ref 38–126)
ALT: 39 U/L (ref 14–54)
AST: 32 U/L (ref 15–41)
Anion gap: 9 (ref 5–15)
BILIRUBIN TOTAL: 0.5 mg/dL (ref 0.3–1.2)
BUN: 10 mg/dL (ref 6–20)
CALCIUM: 9.1 mg/dL (ref 8.9–10.3)
CO2: 26 mmol/L (ref 22–32)
Chloride: 103 mmol/L (ref 101–111)
Creatinine, Ser: 0.56 mg/dL (ref 0.44–1.00)
GFR calc Af Amer: >60 mL/min (ref >60)
GFR calc non Af Amer: >60 mL/min (ref >60)
GLUCOSE: 146 mg/dL — AB (ref 65–99)
POTASSIUM: 3.7 mmol/L (ref 3.5–5.1)
SODIUM: 138 mmol/L (ref 135–145)
TOTAL PROTEIN: 6.6 g/dL (ref 6.5–8.1)

## 2016-06-14 LAB — CBG MONITORING, ED: Glucose-Capillary: 177 mg/dL — ABNORMAL HIGH (ref 65–99)

## 2016-06-14 MED ORDER — IOPAMIDOL (ISOVUE-300) INJECTION 61%: 100.0000 mL | Freq: Once | INTRAVENOUS | Status: DC | PRN

## 2016-06-14 NOTE — ED Provider Notes (Signed)
[facility] Provider Note   CSN: [id number] Arrival date & time: 06/14/16  2010  First Provider Contact:  First MD Initiated Contact with Patient 06/14/16 2133      By signing my name below, I, Randa Evens, attest that this documentation has been prepared under the direction and in the presence of [employer], PA-C. Electronically Signed: Randa Evens, ED Scribe. 06/14/16. 9:39 PM.     History   Chief Complaint Chief Complaint  Patient presents with  . Hyperglycemia    HPI Terri Zimmerman is a 50 y.o. female.  The history is provided by the patient. No language interpreter was used.   HPI Comments: Terri Zimmerman is a 50 y.o. female who presents to the Emergency Department complaining of stabbing abdominal pain onset today PTA. Pt reports associated nausea and vomiting. Pt reports that she was having left upper leg pain about 2 hours prior to the abdominal pain. She states that it feels as if that pain was radiating from her lower leg. Pt states that she checked her blood sugar and her sugars was elevated above 200. Pt states she took her insulin PTA. Pt reports Hx of appendectomy and hysterectomy. Denies diarrhea, fever, vaginal discharge, vaginal bleeding, numbness or weakness    Past Medical History:  Diagnosis Date  . Asthma 04-25-07  . Diabetes mellitus 11-12-06   type 2  . Hypertension 11-04-06    Patient Active Problem List   Diagnosis Date Noted  . De Quervain's tenosynovitis, left 02/20/2016  . Patchy loss of hair 03/02/2015  . Tobacco abuse 05/25/2014  . AMENORRHEA 04/17/2010  . PERIPHERAL NEUROPATHY 10/22/2009  . Essential hypertension 11/29/2008  . PALPITATIONS, OCCASIONAL 04/12/2008  . Diabetes mellitus type 2 with complications ([place]) [id number]  . VITAMIN D DEFICIENCY 01/10/2008  . ALLERGIC RHINITIS 01/10/2008  . ASTHMA, UNSPECIFIED, UNSPECIFIED STATUS 01/10/2008    Past Surgical History:  Procedure Laterality Date  . ABDOMINAL  HYSTERECTOMY  12-11   Complete  . APPENDECTOMY  9-00  . BREAST SURGERY  4-00   of cyst  . FOOT TENDON SURGERY  05/16/2012   bone spur.    Marland Kitchen LAPAROSCOPY  4-08   for cyst and endometriosis  . left breast bx  12-11    OB History    No data available       Home Medications    Prior to Admission medications   Medication Sig Start Date End Date Taking? Authorizing Provider  AMBULATORY NON FORMULARY MEDICATION One Touch Ultra Test Strips.  Diagnosis: DM  Testing 2 times a day 11/01/12   Hali Marry, MD  Dulaglutide (TRULICITY) [id number] [id number] SOPN Inject 0.75 mg into the skin once a week. 06/09/16   Hali Marry, MD  glucose blood (ONE TOUCH ULTRA TEST) test strip Test blood sugar once daily. Diagnosis Diabetes ICD-10 E11.8. 06/01/16   Hali Marry, MD  LANTUS SOLOSTAR 100 UNIT/ML Solostar Pen INJECT 65 UNITS SUBCUTANEOUSLY ONCE DAILY Patient taking differently: INJECT 77 UNITS SUBCUTANEOUSLY ONCE DAILY 04/24/16   Hali Marry, MD  lisinopril (PRINIVIL,ZESTRIL) 20 MG tablet take 1 tablet by mouth once daily 02/27/16   Hali Marry, MD  naproxen sodium (ANAPROX) 220 MG tablet Take 220 mg by mouth 2 (two) times daily with a meal. Patient used this medication for pain.    Historical Provider, MD  NON FORMULARY One Touch Ultra View test strips- 2 bottles     Historical Provider, MD  NON FORMULARY Glucometer, strips, and  lancets to test daily DX:250.00     Historical Provider, MD  NOVOFINE 30G X 8 MM MISC use to inject 10 units INTO THE SKIN as directed 10/24/15   Hali Marry, MD  Saxagliptin-Metformin (KOMBIGLYZE XR) 03-999 MG TB24 Take one tablet by mouth daily 05/22/16   Hali Marry, MD    Family History Family History  Problem Relation Age of Onset  . Other Mother     RA and CHF  . Diabetes Father   . Diabetes Other   . Heart disease Other     Social History Social History  Substance Use Topics  . Smoking status: Current Every Day  Smoker    Packs/day: 0.50    Types: Cigarettes  . Smokeless tobacco: Not on file  . Alcohol use No     Allergies   Farxiga [dapagliflozin]; Invokana [canagliflozin]; Jentadueto [linagliptin-metformin hcl]; and Lipitor [atorvastatin]   Review of Systems Review of Systems  Constitutional: Negative for fever.  HENT: Negative for rhinorrhea and sore throat.   Eyes: Negative for redness.  Respiratory: Negative for cough.   Cardiovascular: Negative for chest pain.  Gastrointestinal: Positive for abdominal pain, nausea and vomiting. Negative for diarrhea.  Genitourinary: Negative for dysuria, vaginal bleeding and vaginal discharge.  Musculoskeletal: Positive for myalgias.  Skin: Negative for rash.  Neurological: Negative for weakness, numbness and headaches.     Physical Exam Updated Vital Signs BP 140/92 (BP Location: Left Arm)   Pulse 104   Temp 98.4 F (36.9 C) (Oral)   Resp 18   Ht 5\' 1"  (1.549 m)   Wt 180 lb (81.6 kg)   SpO2 96%   BMI 34.01 kg/m   Physical Exam  Constitutional: She is oriented to person, place, and time. She appears well-developed and well-nourished. No distress.  HENT:  Head: Normocephalic and atraumatic.  Eyes: Conjunctivae and EOM are normal.  Neck: Neck supple. No tracheal deviation present.  Cardiovascular: Normal rate.   Pulmonary/Chest: Effort normal. No respiratory distress.  Abdominal: Soft. She exhibits no distension. There is tenderness (mild, bilateral upper, worse on right, neg Murphy sign). There is no guarding.  Musculoskeletal: Normal range of motion.  Neurological: She is alert and oriented to person, place, and time.  Skin: Skin is warm and dry.  Psychiatric: She has a normal mood and affect. Her behavior is normal.  Nursing note and vitals reviewed.    ED Treatments / Results  DIAGNOSTIC STUDIES: Oxygen Saturation is 96% on RA, adequate by my interpretation.    COORDINATION OF CARE: 9:40 PM-Discussed treatment plan which  includes labs with pt at bedside and pt agreed to plan. CT ordered.   12:37 AM  patient updated on results. Pain remains controlled. She is tolerating orals. Will discharge to home with PCP follow-up.  The patient was urged to return to the Emergency Department immediately with worsening of current symptoms, worsening abdominal pain, persistent vomiting, blood noted in stools, fever, or any other concerns. The patient verbalized understanding.     Labs (all labs ordered are listed, but only abnormal results are displayed) Labs Reviewed  URINALYSIS, ROUTINE W REFLEX MICROSCOPIC (NOT AT [facility]) - Abnormal; Notable for the following:       Result Value   Ketones, ur 15 (*)    All other components within normal limits  COMPREHENSIVE METABOLIC PANEL - Abnormal; Notable for the following:    Glucose, Bld 146 (*)    All other components within normal limits  CBC WITH DIFFERENTIAL/PLATELET -  Abnormal; Notable for the following:    WBC 13.1 (*)    Neutro Abs 7.8 (*)    Lymphs Abs 4.6 (*)    All other components within normal limits  CBG MONITORING, ED - Abnormal; Notable for the following:    Glucose-Capillary 177 (*)    All other components within normal limits  LIPASE, BLOOD    EKG  EKG Interpretation None       Radiology Ct Abdomen Pelvis W Contrast  Result Date: 06/15/2016 CLINICAL DATA:  Lower abdominal pain and constipation today. Hyperglycemia. History of diabetes, asthma, hypertension, hysterectomy and appendectomy. EXAM: CT ABDOMEN AND PELVIS WITH CONTRAST TECHNIQUE: Multidetector CT imaging of the abdomen and pelvis was performed using the standard protocol following bolus administration of intravenous contrast. CONTRAST:  100 cc Isovue-300 COMPARISON:  MRI of the abdomen November 05, 2012 FINDINGS: LUNG BASES: Included view of the lung bases are clear. Visualized heart and pericardium are unremarkable. SOLID ORGANS: The liver is diffusely hypodense, with slightly nodular  contour. Less than 1 cm hypodensity RIGHT lobe of the liver most compatible with cysts. Spleen, gallbladder, pancreas and LEFT adrenal gland are unremarkable. 28 mm RIGHT adrenal nodule, previously characterized as benign adenoma by MRI. GASTROINTESTINAL TRACT: The stomach, small and large bowel are normal in course and caliber without inflammatory changes. Featureless descending and sigmoid colon can be seen with chronic laxative use. Colon is decompressed. No significant retained large bowel stool. Status post appendectomy. KIDNEYS/ URINARY TRACT: Kidneys are orthotopic, demonstrating symmetric enhancement. No nephrolithiasis, hydronephrosis or solid renal masses. The unopacified ureters are normal in course and caliber. Delayed imaging through the kidneys demonstrates symmetric prompt contrast excretion within the proximal urinary collecting system. Urinary bladder is well distended and unremarkable. PERITONEUM/RETROPERITONEUM: Aortoiliac vessels are normal in course and caliber, trace calcific atherosclerosis. 16 mm portal caval lymph node with additional prominent porta hepatis lymph nodes is likely reactive. Status post hysterectomy. No intraperitoneal free fluid nor free air. SOFT TISSUE/OSSEOUS STRUCTURES: Non-suspicious. Mild sacroiliac osteoarthrosis. RIGHT iliac subcentimeter sclerotic probable bone island. Moderate L3-4 through L5-S1 disc height loss, vacuum disc and moderate facet arthropathy. Moderate to severe LEFT L5-S1 neural foraminal narrowing, moderate RIGHT L3-4 neural foraminal narrowing. IMPRESSION: No acute intra-abdominal or pelvic process. Hepatic steatosis and early suspected cirrhosis. Electronically Signed   By: Elon Alas M.D.   On: 06/15/2016 00:00   Procedures Procedures (including critical care time)  Medications Ordered in ED Medications  iopamidol (ISOVUE-300) 61 % injection 100 mL (not administered)     Initial Impression / Assessment and Plan / ED Course  I  have reviewed the triage vital signs and the nursing notes.  Pertinent labs & imaging results that were available during my care of the patient were reviewed by me and considered in my medical decision making (see chart for details).  Clinical Course     Final Clinical Impressions(s) / ED Diagnoses   Final diagnoses:  Pain of upper abdomen   Patient with abdominal pain. Vitals are stable, no fever. Labs show slight leukocytosis, otherwise unremarkable. Imaging reassuring. No signs of dehydration, patient is tolerating PO's. Lungs are clear and no signs suggestive of PNA. Low concern for cholecystitis, pancreatitis, ruptured viscus, UTI, kidney stone, aortic dissection, aortic aneurysm or other emergent abdominal etiology. Supportive therapy indicated with return if symptoms worsen.   Given DDD in lower back, pain in L leg earlier today likely related to radiculopathy. No red flags. No signs of vascular compromise.    New Prescriptions  New Prescriptions   No medications on file     I personally performed the services described in this documentation, which was scribed in my presence. The recorded information has been reviewed and is accurate.     Carlisle Cater, PA-C 06/15/16 [id number]    Isla Pence, MD 06/22/16 Despina Pole

## 2016-06-14 NOTE — ED Notes (Signed)
Josh, PA at bedside. °

## 2016-06-14 NOTE — ED Triage Notes (Signed)
Pt reports hyperglycemia (took insulin PTA) today.  Also reports abdominal cramping and constipation.

## 2016-06-15 NOTE — ED Notes (Signed)
Pt made aware to return if symptoms worsen or if any life threatening symptoms occur.   

## 2016-06-15 NOTE — Discharge Instructions (Signed)
Please read and follow all provided instructions.  Your diagnoses today include:  1. Pain of upper abdomen     Tests performed today include: Blood counts and electrolytes - slightly high white blood cell count Blood tests to check liver and kidney function Blood tests to check pancreas function Urine test to look for infection and pregnancy (in women) CT scan - does not show any serious causes of your pain Vital signs. See below for your results today.   Medications prescribed:  None  Take any prescribed medications only as directed.  Home care instructions:  Follow any educational materials contained in this packet.  Follow-up instructions: Please follow-up with your primary care provider in the next 3 days for further evaluation of your symptoms.    Return instructions:  SEEK IMMEDIATE MEDICAL ATTENTION IF: The pain does not go away or becomes severe  A temperature above 101F develops  Repeated vomiting occurs (multiple episodes)  The pain becomes localized to portions of the abdomen. The right side could possibly be appendicitis. In an adult, the left lower portion of the abdomen could be colitis or diverticulitis.  Blood is being passed in stools or vomit (bright red or black tarry stools)  You develop chest pain, difficulty breathing, dizziness or fainting, or become confused, poorly responsive, or inconsolable (young children) If you have any other emergent concerns regarding your health  Additional Information: Abdominal (belly) pain can be caused by many things. Your caregiver performed an examination and possibly ordered blood/urine tests and imaging (CT scan, x-rays, ultrasound). Many cases can be observed and treated at home after initial evaluation in the emergency department. Even though you are being discharged home, abdominal pain can be unpredictable. Therefore, you need a repeated exam if your pain does not resolve, returns, or worsens. Most patients with  abdominal pain don't have to be admitted to the hospital or have surgery, but serious problems like appendicitis and gallbladder attacks can start out as nonspecific pain. Many abdominal conditions cannot be diagnosed in one visit, so follow-up evaluations are very important.  Your vital signs today were: BP 131/85 (BP Location: Left Arm)    Pulse 99    Temp 98.4 F (36.9 C) (Oral)    Resp 18    Ht 5\' 1"  (1.549 m)    Wt 81.6 kg    SpO2 98%    BMI 34.01 kg/m  If your blood pressure (bp) was elevated above 135/85 this visit, please have this repeated by your doctor within one month. --------------

## 2016-06-19 ENCOUNTER — Ambulatory Visit (INDEPENDENT_AMBULATORY_CARE_PROVIDER_SITE_OTHER): Payer: BLUE CROSS/BLUE SHIELD

## 2016-06-19 DIAGNOSIS — Z1231 Encounter for screening mammogram for malignant neoplasm of breast: Secondary | ICD-10-CM

## 2016-06-30 ENCOUNTER — Ambulatory Visit (INDEPENDENT_AMBULATORY_CARE_PROVIDER_SITE_OTHER): Payer: BLUE CROSS/BLUE SHIELD | Admitting: Family Medicine

## 2016-06-30 ENCOUNTER — Encounter: Payer: Self-pay | Admitting: Family Medicine

## 2016-06-30 VITALS — BP 120/76 | HR 105 | Ht 61.0 in | Wt 188.0 lb

## 2016-06-30 DIAGNOSIS — K760 Fatty (change of) liver, not elsewhere classified: Secondary | ICD-10-CM | POA: Diagnosis not present

## 2016-06-30 DIAGNOSIS — R1084 Generalized abdominal pain: Secondary | ICD-10-CM | POA: Diagnosis not present

## 2016-06-30 DIAGNOSIS — E118 Type 2 diabetes mellitus with unspecified complications: Secondary | ICD-10-CM | POA: Diagnosis not present

## 2016-06-30 NOTE — Patient Instructions (Signed)
Call me in a couple of weeks and let me know your blood sugars and we can adjust your Lantus.

## 2016-06-30 NOTE — Progress Notes (Signed)
Subjective:    CC:   HPI:  pt reports that she stopped taking the trulicity. she stated that it caused her to have abdominal pain, nausea, decreased appetite, constipation and just made her feel bad. We had gone up on her dose and she got worse.  SaysShe would feel bad every Saturday about 2 days after taking the injection. To the point where she would stay in bed. This last time she actually ended up going to the emergency department because she was having waves of intense abdominal pain. They did do a CT scan which was negative for any acute findings. They recommended that she stop her Trulicity. She is still taking her Kombiglyze and 65 units of Lantus. She is feeling much better the last 2 days.    CT also mentioned fatty liver disease. They also noted that there is possible early cirrhosis.  Past medical history, Surgical history, Family history not pertinant except as noted below, Social history, Allergies, and medications have been entered into the medical record, reviewed, and corrections made.   Review of Systems: No fevers, chills, night sweats, weight loss, chest pain, or shortness of breath.   Objective:    General: Well Developed, well nourished, and in no acute distress.  Neuro: Alert and oriented x3, extra-ocular muscles intact, sensation grossly intact.  HEENT: Normocephalic, atraumatic  Skin: Warm and dry, no rashes. Cardiac: Regular rate and rhythm, no murmurs rubs or gallops, no lower extremity edema.  Respiratory: Clear to auscultation bilaterally. Not using accessory muscles, speaking in full sentences. Abd: Soft, mildly tender over the epigastric area. No organomegaly.   Impression and Recommendations:    Abdominal pain-mostly resolved since being off the Trulicity. Her next injection was due last Thursday and she skipped it.  Diabetes-the several options. For now we'll continue with Kombiglyze. And we will just adjust her Lantus. We may come back to trying  something else in that same class such as Victoza but will wait until her next A1c in October. Next  Fatty liver disease- as the importance of healthy diet, regular exercise and weight loss to control the fat content in the liver and reduce inflammation.  NAFLD score of 0.79 so low risk for fibrosis.

## 2016-07-03 ENCOUNTER — Emergency Department (HOSPITAL_BASED_OUTPATIENT_CLINIC_OR_DEPARTMENT_OTHER)
Admission: EM | Admit: 2016-07-03 | Discharge: 2016-07-03 | Disposition: A | Discharge status: Home / Self Care | Payer: BLUE CROSS/BLUE SHIELD | Attending: Emergency Medicine | Admitting: Emergency Medicine

## 2016-07-03 ENCOUNTER — Encounter (HOSPITAL_BASED_OUTPATIENT_CLINIC_OR_DEPARTMENT_OTHER): Payer: Self-pay

## 2016-07-03 ENCOUNTER — Emergency Department (HOSPITAL_BASED_OUTPATIENT_CLINIC_OR_DEPARTMENT_OTHER): Payer: BLUE CROSS/BLUE SHIELD

## 2016-07-03 DIAGNOSIS — M7989 Other specified soft tissue disorders: Secondary | ICD-10-CM | POA: Insufficient documentation

## 2016-07-03 DIAGNOSIS — E119 Type 2 diabetes mellitus without complications: Secondary | ICD-10-CM | POA: Diagnosis not present

## 2016-07-03 DIAGNOSIS — J45909 Unspecified asthma, uncomplicated: Secondary | ICD-10-CM | POA: Insufficient documentation

## 2016-07-03 DIAGNOSIS — Z79899 Other long term (current) drug therapy: Secondary | ICD-10-CM | POA: Diagnosis not present

## 2016-07-03 DIAGNOSIS — I10 Essential (primary) hypertension: Secondary | ICD-10-CM | POA: Insufficient documentation

## 2016-07-03 DIAGNOSIS — Z794 Long term (current) use of insulin: Secondary | ICD-10-CM | POA: Insufficient documentation

## 2016-07-03 DIAGNOSIS — Z791 Long term (current) use of non-steroidal anti-inflammatories (NSAID): Secondary | ICD-10-CM | POA: Insufficient documentation

## 2016-07-03 DIAGNOSIS — F17210 Nicotine dependence, cigarettes, uncomplicated: Secondary | ICD-10-CM | POA: Diagnosis not present

## 2016-07-03 NOTE — ED Triage Notes (Signed)
C/o swelling, warm shooting pain to left LE-denies injury-was advised to come to ED by PCP to r/u blood clot per pt-NAD-steady gait

## 2016-07-03 NOTE — ED Provider Notes (Signed)
Emergency Department Provider Note By signing my name below, I, Terri Zimmerman, attest that this documentation has been prepared under the direction and in the presence of Terri Fast, MD. Electronically Signed: [name], ED Scribe. 07/03/16. 7:31 PM.   Terri Fast, MD has reviewed the triage vital signs and the nursing notes.   HISTORY  Chief Complaint Leg Swelling   HPI Comments: Terri Zimmerman is a 50 y.o. female with a hx of asthma, DM, and hypertension who presents to the Emergency Department complaining of ongoing moderate LLE swelling onset 4 days ago. She reports associated burning to her middle of her left lower leg that radiates up toward her knees and left-sided headache. No alleviating factors noted. Pt has not tried any medications PTA. She denies any hx of PE/DVT. She also denies any recent immobilizations, hospitalizations, or surgeries. She denies any anti-coagulant use. Pt explains that she went to her PCP 3 days ago but the leg swelling was not as severe so she did not mention it then. Pt reports a hx of left foot surgery with bone spur removal. She denies any chest pain, SOB, n/v/d, acute abdominal pain, generalized rash, or any open wounds.    Past Medical History:  Diagnosis Date  . Asthma 04-25-07  . Diabetes mellitus 11-12-06   type 2  . Hypertension 11-04-06    Patient Active Problem List   Diagnosis Date Noted  . NAFL (nonalcoholic fatty liver) 99991111  . De Quervain's tenosynovitis, left 02/20/2016  . Patchy loss of hair 03/02/2015  . Tobacco abuse 05/25/2014  . AMENORRHEA 04/17/2010  . PERIPHERAL NEUROPATHY 10/22/2009  . Essential hypertension 11/29/2008  . PALPITATIONS, OCCASIONAL 04/12/2008  . Diabetes mellitus type 2 with complications ([place]) [id number]  . VITAMIN D DEFICIENCY 01/10/2008  . ALLERGIC RHINITIS 01/10/2008  . ASTHMA, UNSPECIFIED, UNSPECIFIED STATUS 01/10/2008    Past Surgical History:  Procedure Laterality Date    . ABDOMINAL HYSTERECTOMY  12-11   Complete  . APPENDECTOMY  9-00  . BREAST SURGERY  4-00   of cyst  . FOOT TENDON SURGERY  05/16/2012   bone spur.    Marland Kitchen LAPAROSCOPY  4-08   for cyst and endometriosis  . left breast bx  12-11    Current Outpatient Rx  . Order #: [id number] Class: Print  . Order #: [id number] Class: Normal  . Order #: [id number] Class: Normal  . Order #: [id number] Class: Normal  . Order #: [id number] Class: Historical Med  . Order #: [id number] Class: Historical Med  . Order #: [id number] Class: Historical Med  . Order #: [id number] Class: Normal  . Order #: [id number] Class: Normal    Allergies Trulicity [dulaglutide]; Farxiga [dapagliflozin]; Invokana [canagliflozin]; Jentadueto [linagliptin-metformin hcl]; and Lipitor [atorvastatin]  Family History  Problem Relation Age of Onset  . Other Mother     RA and CHF  . Diabetes Father   . Diabetes Other   . Heart disease Other     Social History Social History  Substance Use Topics  . Smoking status: Current Every Day Smoker    Packs/day: 0.50    Types: Cigarettes  . Smokeless tobacco: Never Used  . Alcohol use No    Review of Systems Constitutional: No fever/chills Eyes: No visual changes. ENT: No sore throat. Cardiovascular: Denies chest pain. Respiratory: Denies shortness of breath. Gastrointestinal: No abdominal pain.  No nausea, no vomiting.  No diarrhea.  No constipation. Genitourinary: Negative for dysuria. Musculoskeletal: Negative for back pain. LLE swelling Skin: Negative for rash. Neurological: Negative for headaches,  focal weakness or numbness.  10-point ROS otherwise negative.  ____________________________________________   PHYSICAL EXAM:  VITAL SIGNS: ED Triage Vitals  Enc Vitals Group     BP 07/03/16 1827 160/97     Pulse Rate 07/03/16 1827 117     Resp 07/03/16 1827 20     Temp 07/03/16 1827 99.1 F (37.3 C)     Temp Source 07/03/16 1827 Oral     SpO2 07/03/16 1827 99 %     Weight 07/03/16  1827 190 lb (86.2 kg)     Height 07/03/16 1827 5\' 1"  (1.549 m)     Pain Score 07/03/16 1825 7   Constitutional: Alert and oriented. Well appearing and in no acute distress. Eyes: Conjunctivae are normal.  Head: Atraumatic. Nose: No congestion/rhinnorhea. Mouth/Throat: Mucous membranes are moist.  Oropharynx non-erythematous. Neck: No stridor.  Cardiovascular: Normal rate, regular rhythm. Good peripheral circulation. Grossly normal heart sounds.   Respiratory: Normal respiratory effort.  No retractions. Lungs CTAB. Gastrointestinal: Soft and nontender. No distention.  Musculoskeletal: No lower extremity tenderness. Slight LLE edema noted (non-pitting). No erythema or warmth. No gross deformities of extremities. Neurologic:  Normal speech and language. No gross focal neurologic deficits are appreciated.  Skin:  Skin is warm, dry and intact. No rash noted. Psychiatric: Mood and affect are normal. Speech and behavior are normal.  ____________________________________________ DIAGNOSTIC STUDIES: Oxygen Saturation is 99% on RA, normal by my interpretation.    COORDINATION OF CARE: 7:21 PM- Pt advised of plan for treatment and pt agrees. Pt will receive [place] of left leg for further evaluation.    ____________________________________________  RADIOLOGY  US Venous Img Lower Unilateral Left  Result Date: 07/03/2016 CLINICAL DATA:  Left leg swelling. EXAM: LEFT LOWER EXTREMITY VENOUS DOPPLER ULTRASOUND TECHNIQUE: Gray-scale sonography with graded compression, as well as color Doppler and duplex ultrasound were performed to evaluate the lower extremity deep venous systems from the level of the common femoral vein and including the common femoral, femoral, profunda femoral, popliteal and calf veins including the posterior tibial, peroneal and gastrocnemius veins when visible. The superficial great saphenous vein was also interrogated. Spectral Doppler was utilized to evaluate flow at rest and with  distal augmentation maneuvers in the common femoral, femoral and popliteal veins. COMPARISON:  None. FINDINGS: Contralateral Common Femoral Vein: Respiratory phasicity is normal and symmetric with the symptomatic side. No evidence of thrombus. Normal compressibility. Common Femoral Vein: No evidence of thrombus. Normal compressibility, respiratory phasicity and response to augmentation. Saphenofemoral Junction: No evidence of thrombus. Normal compressibility and flow on color Doppler imaging. Profunda Femoral Vein: No evidence of thrombus. Normal compressibility and flow on color Doppler imaging. Femoral Vein: No evidence of thrombus. Normal compressibility, respiratory phasicity and response to augmentation. Popliteal Vein: No evidence of thrombus. Normal compressibility, respiratory phasicity and response to augmentation. Calf Veins: No evidence of thrombus. Normal compressibility and flow on color Doppler imaging. Superficial Great Saphenous Vein: No evidence of thrombus. Normal compressibility and flow on color Doppler imaging. Venous Reflux:  None. 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 The patient reports this has been unchanged in size for at least 4 years. IMPRESSION: No evidence of deep venous thrombosis. Next item probable lipoma in the anterior thigh Probable lipoma in the anterior thigh Electronically Signed   By: Dorise Bullion III M.D   On: 07/03/2016 19:27    ____________________________________________   PROCEDURES  Procedure(s) performed:   Procedures  None ____________________________________________  INITIAL IMPRESSION / ASSESSMENT AND PLAN / ED COURSE  Pertinent labs & imaging results that were available during my care of the patient were reviewed by me and considered in my medical decision making (see chart for details).  Patient presents to the ED for evaluation of LLE edema. No evidence of cellulitis. No trauma. No  associated CP or SOB. No history of DVT. Patient is low risk for DVT/PE. Also complaining of unilateral burning pain medially. No rash. Suspect neuropathy pain vs ankle/foot arthritis with prior remote surgery to right foot. US of the LLE performed and read pending.   07:35 PM No DVT on ultrasound. Plan for discharge with primary care physician follow-up. Discussed results with patient and answer questions. No evidence of cellulitis or other infection. Discussed return precautions in detail.  At this time, I do not feel there is any life-threatening condition present. I have reviewed and discussed all results (EKG, imaging, lab, urine as appropriate), exam findings with patient. I have reviewed nursing notes and appropriate previous records.  I feel the patient is safe to be discharged home without further emergent workup. Discussed usual and customary return precautions. Patient and family (if present) verbalize understanding and are comfortable with this plan.  Patient will follow-up with their primary care provider. If they do not have a primary care provider, information for follow-up has been provided to them. All questions have been answered.    ____________________________________________  FINAL CLINICAL IMPRESSION(S) / ED DIAGNOSES  Final diagnoses:  Leg swelling     MEDICATIONS GIVEN DURING THIS VISIT:  None  NEW OUTPATIENT MEDICATIONS STARTED DURING THIS VISIT:  None  Documentation performed with the assistance of a scribe. I have reviewed the documentation and made changes as necessary.    Note:  This document was prepared using Dragon voice recognition software and may include unintentional dictation errors.  Nanda Quinton, MD Emergency Medicine    Terri Fast, MD 07/04/16 [phone]

## 2016-07-03 NOTE — Discharge Instructions (Signed)
You were seen in the ED today with leg swelling and pain. We performed an ultrasound of the leg with no evidence of blood clot. Follow up with your PCP regarding your pain in the leg if in continues. Take Tylenol and/or Motrin as needed for pain.   Return to the ED with any fever, chills, chest pain, or difficulty breathing.

## 2016-08-20 ENCOUNTER — Ambulatory Visit (INDEPENDENT_AMBULATORY_CARE_PROVIDER_SITE_OTHER): Payer: BLUE CROSS/BLUE SHIELD | Admitting: Family Medicine

## 2016-08-20 ENCOUNTER — Encounter: Payer: Self-pay | Admitting: Family Medicine

## 2016-08-20 VITALS — BP 138/84 | HR 97 | Ht 61.0 in | Wt 188.0 lb

## 2016-08-20 DIAGNOSIS — Z23 Encounter for immunization: Secondary | ICD-10-CM | POA: Diagnosis not present

## 2016-08-20 DIAGNOSIS — E118 Type 2 diabetes mellitus with unspecified complications: Secondary | ICD-10-CM | POA: Diagnosis not present

## 2016-08-20 DIAGNOSIS — N760 Acute vaginitis: Secondary | ICD-10-CM

## 2016-08-20 DIAGNOSIS — R300 Dysuria: Secondary | ICD-10-CM | POA: Diagnosis not present

## 2016-08-20 DIAGNOSIS — M7989 Other specified soft tissue disorders: Secondary | ICD-10-CM

## 2016-08-20 DIAGNOSIS — I10 Essential (primary) hypertension: Secondary | ICD-10-CM

## 2016-08-20 LAB — CBC WITH DIFFERENTIAL/PLATELET
BASOS ABS: 0 {cells}/uL (ref 0–200)
Basophils Relative: 0 %
EOS ABS: 94 {cells}/uL (ref 15–500)
Eosinophils Relative: 1 %
HEMATOCRIT: 43.7 % (ref 35.0–45.0)
HEMOGLOBIN: 14.5 g/dL (ref 11.7–15.5)
LYMPHS ABS: 3290 {cells}/uL (ref 850–3900)
LYMPHS PCT: 35 %
MCH: 28 pg (ref 27.0–33.0)
MCHC: 33.2 g/dL (ref 32.0–36.0)
MCV: 84.4 fL (ref 80.0–100.0)
MONO ABS: 376 {cells}/uL (ref 200–950)
MPV: 10.7 fL (ref 7.5–12.5)
Monocytes Relative: 4 %
NEUTROS PCT: 60 %
Neutro Abs: 5640 cells/uL (ref 1500–7800)
Platelets: 250 10*3/uL (ref 140–400)
RBC: 5.18 MIL/uL — AB (ref 3.80–5.10)
RDW: 13.5 % (ref 11.0–15.0)
WBC: 9.4 10*3/uL (ref 3.8–10.8)

## 2016-08-20 LAB — COMPLETE METABOLIC PANEL WITH GFR
ALT: 44 U/L — AB (ref 6–29)
AST: 33 U/L (ref 10–35)
Albumin: 3.8 g/dL (ref 3.6–5.1)
Alkaline Phosphatase: 95 U/L (ref 33–130)
BUN: 6 mg/dL — AB (ref 7–25)
CALCIUM: 9.3 mg/dL (ref 8.6–10.4)
CHLORIDE: 104 mmol/L (ref 98–110)
CO2: 29 mmol/L (ref 20–31)
CREATININE: 0.66 mg/dL (ref 0.50–1.05)
GFR, Est African American: >89 mL/min (ref >=60)
GFR, Est Non African American: >89 mL/min (ref >=60)
GLUCOSE: 197 mg/dL — AB (ref 65–99)
POTASSIUM: 4.6 mmol/L (ref 3.5–5.3)
Sodium: 140 mmol/L (ref 135–146)
TOTAL PROTEIN: 6.1 g/dL (ref 6.1–8.1)
Total Bilirubin: 0.6 mg/dL (ref 0.2–1.2)

## 2016-08-20 LAB — POCT URINALYSIS DIPSTICK
Bilirubin, UA: NEGATIVE
Glucose, UA: NEGATIVE
KETONES UA: NEGATIVE
Nitrite, UA: NEGATIVE
PH UA: 6.5
PROTEIN UA: NEGATIVE
SPEC GRAV UA: 1.025
UROBILINOGEN UA: 0.2

## 2016-08-20 LAB — WET PREP, GENITAL
Clue Cells Wet Prep HPF POC: NONE SEEN
Trich, Wet Prep: NONE SEEN

## 2016-08-20 LAB — TSH: TSH: 1.3 mIU/L

## 2016-08-20 LAB — POCT GLYCOSYLATED HEMOGLOBIN (HGB A1C): HEMOGLOBIN A1C: 8.6

## 2016-08-20 MED ORDER — LIRAGLUTIDE 18 MG/3ML ~~LOC~~ SOPN: 0.6000 mg | PEN_INJECTOR | Freq: Every day | SUBCUTANEOUS | 2 refills | Status: DC

## 2016-08-20 MED ORDER — CIPROFLOXACIN HCL 500 MG PO TABS: 500.0000 mg | ORAL_TABLET | Freq: Two times a day (BID) | ORAL | 0 refills | Status: AC

## 2016-08-20 MED ORDER — FLUCONAZOLE 150 MG PO TABS: 150.0000 mg | ORAL_TABLET | Freq: Once | ORAL | 0 refills | Status: DC

## 2016-08-20 NOTE — Addendum Note (Signed)
Addended by: Beatrice Lecher D on: 08/20/2016 05:17 PM   Modules accepted: Orders

## 2016-08-20 NOTE — Progress Notes (Signed)
Subjective:    CC: DM, HTN HPI: Diabetes - no hypoglycemic events. No wounds or sores that are not healing well. No increased thirst or urination. Checking glucose at home. Taking medications as prescribed without any side effects.Of to 65 units on her Lantus. Blood sugars running in the 120s mostly.He has not been walking for exercise.  Hypertension- Pt denies chest pain, SOB, dizziness, or heart palpitations.  Taking meds as directed w/o problems.  Denies medication side effects.    C/o of vaginal itching and burning when she urinates x on and off for a couple of weeks.  She's also had swelling in her lower extremity some peeling in her left ankle. Infection went to the emergency department recently for it. Says it has been a little better. Says looks good in the AM abut then swells by the ned of the day.   Past medical history, Surgical history, Family history not pertinant except as noted below, Social history, Allergies, and medications have been entered into the medical record, reviewed, and corrections made.   Review of Systems: No fevers, chills, night sweats, weight loss, chest pain, or shortness of breath.   Objective:    General: Well Developed, well nourished, and in no acute distress.  Neuro: Alert and oriented x3, extra-ocular muscles intact, sensation grossly intact.  HEENT: Normocephalic, atraumatic  Skin: Warm and dry, no rashes. Cardiac: Regular rate and rhythm, no murmurs rubs or gallops, no lower extremity edema.  Respiratory: Clear to auscultation bilaterally. Not using accessory muscles, speaking in full sentences. Extremities: Some trace edema in the left ankle. None on the right.   Impression and Recommendations:    DM- Uncontrolled.  Will try Victoza. She had abdominal pain with the trulicity. Continue 65 units of Lantus as well as the [place]. Follow-up in 3 months. Also encouraged her to get back on track with diet and exercise.  HTN - well controlled.  Continue current regimen.  Urinary tract infection-we'll treat with Cipro.  Vaginitis-wet prep sent. We will call with results once available.  Lower extremity swelling-likely venous stasis based on her description and exam today. Recommended a trial of compression stockings and really making sure that she is monitoring her salt intake.

## 2016-08-29 ENCOUNTER — Other Ambulatory Visit: Payer: Self-pay | Admitting: Family Medicine

## 2016-09-28 ENCOUNTER — Telehealth: Payer: Self-pay | Admitting: Family Medicine

## 2016-09-28 NOTE — Telephone Encounter (Signed)
Pt states she was treated at last OV for a yeast infection and was given diflucan. Her symptoms went away, but now they are back. Pt reports no discharge but is having burning/itching. Will route to PCP for review.

## 2016-09-28 NOTE — Telephone Encounter (Signed)
Okay to refill the Diflucan. Please sent to pharmacy. If her symptoms recur in the next month or 2 then she may need to come in for a repeat wet prep.

## 2016-09-29 MED ORDER — FLUCONAZOLE 150 MG PO TABS: 150.0000 mg | ORAL_TABLET | Freq: Once | ORAL | 0 refills | Status: AC

## 2016-09-29 NOTE — Telephone Encounter (Signed)
Rx sent. Left VM for Pt and advised of new Rx and PCP recommendation.

## 2016-10-30 ENCOUNTER — Other Ambulatory Visit: Payer: Self-pay | Admitting: Family Medicine

## 2016-11-05 ENCOUNTER — Other Ambulatory Visit: Payer: Self-pay | Admitting: Family Medicine

## 2016-11-23 ENCOUNTER — Ambulatory Visit: Payer: BLUE CROSS/BLUE SHIELD | Admitting: Family Medicine

## 2016-11-25 ENCOUNTER — Other Ambulatory Visit: Payer: Self-pay | Admitting: Family Medicine

## 2016-11-26 ENCOUNTER — Other Ambulatory Visit: Payer: Self-pay | Admitting: *Deleted

## 2016-11-26 MED ORDER — SITAGLIP PHOS-METFORMIN HCL ER 100-1000 MG PO TB24: 1.0000 | ORAL_TABLET | Freq: Every day | ORAL | 1 refills | Status: DC

## 2016-11-26 NOTE — Telephone Encounter (Signed)
kombiglyze is not covered under pt's insurance. Can this be changed to janumet?

## 2016-11-30 ENCOUNTER — Encounter: Payer: Self-pay | Admitting: Family Medicine

## 2016-11-30 ENCOUNTER — Ambulatory Visit (INDEPENDENT_AMBULATORY_CARE_PROVIDER_SITE_OTHER): Payer: BLUE CROSS/BLUE SHIELD | Admitting: Family Medicine

## 2016-11-30 ENCOUNTER — Other Ambulatory Visit: Payer: Self-pay

## 2016-11-30 VITALS — BP 133/85 | HR 101 | Wt 184.0 lb

## 2016-11-30 DIAGNOSIS — E669 Obesity, unspecified: Secondary | ICD-10-CM | POA: Diagnosis not present

## 2016-11-30 DIAGNOSIS — I10 Essential (primary) hypertension: Secondary | ICD-10-CM

## 2016-11-30 DIAGNOSIS — E118 Type 2 diabetes mellitus with unspecified complications: Secondary | ICD-10-CM

## 2016-11-30 DIAGNOSIS — Z23 Encounter for immunization: Secondary | ICD-10-CM | POA: Diagnosis not present

## 2016-11-30 LAB — POCT GLYCOSYLATED HEMOGLOBIN (HGB A1C): Hemoglobin A1C: 8.2

## 2016-11-30 MED ORDER — INSULIN GLARGINE 100 UNIT/ML SOLOSTAR PEN: 75.0000 [IU] | PEN_INJECTOR | Freq: Every day | SUBCUTANEOUS | 5 refills | Status: DC

## 2016-11-30 NOTE — Progress Notes (Signed)
Subjective:    CC: DM, HTN  HPI: Diabetes - no hypoglycemic events. No wounds or sores that are not healing well. No increased thirst or urination. Checking glucose at home. Taking medications as prescribed without any side effects.  Obesity/BMI 34 - He really has made some progress as far as dietary changes. In fact she is actually lost about 4 pounds. Ultimately she would like to lose 25 pounds by September because her son just got engaged and she wants to look good for the wedding. She has not started an exercise regimen yet but says she plans to.  Hypertension- Pt denies chest pain, SOB, dizziness, or heart palpitations.  Taking meds as directed w/o problems.  Denies medication side effects.     Past medical history, Surgical history, Family history not pertinant except as noted below, Social history, Allergies, and medications have been entered into the medical record, reviewed, and corrections made.   Review of Systems: No fevers, chills, night sweats, weight loss, chest pain, or shortness of breath.   Objective:    General: Well Developed, well nourished, and in no acute distress.  Neuro: Alert and oriented x3, extra-ocular muscles intact, sensation grossly intact.  HEENT: Normocephalic, atraumatic  Skin: Warm and dry, no rashes. Cardiac: Regular rate and rhythm, no murmurs rubs or gallops, no lower extremity edema.  Respiratory: Clear to auscultation bilaterally. Not using accessory muscles, speaking in full sentences.   Impression and Recommendations:   DM- uncontrolled but improved. She is making great progress that was having her A1c would be in the 7 range so she was not able tolerate the Victoza so we will add that to her intolerance list. So we'll go ahead and increase her Lantus to 75 units.  New prescription sent. I did ask her to call in a couple weeks and let me know if her blood sugars are coming down. If not then we can adjust the level a little bit at that time. On an  ACE inhibitor.   Lab Results  Component Value Date   HGBA1C 8.2 11/30/2016    HTN -  Well controlled. Continue current regimen. Follow up in  3-4 months.   Obesity/BMI 34-she's done great and has lost 4 pounds. Encouraged her to continue working on healthy diet and regular exercise. This will make a big impact in her blood pressures as well as her blood glucose.  Tdap given today.

## 2016-12-08 ENCOUNTER — Telehealth: Payer: Self-pay

## 2016-12-08 NOTE — Telephone Encounter (Signed)
Patient switched to Janumet from [facility] because insurance will not pay for [employer]. Patient does not want to switch. I advised her we would try to do an appeal with her insurance company. Sent patient a scanned copy of a Kombiglyze coupon card. Also advised her to call back if the [facility] prescription needs to be changed to a 30 day supply.   Seth Bake is aware and will start the appeal process.

## 2016-12-21 ENCOUNTER — Encounter: Payer: Self-pay | Admitting: *Deleted

## 2016-12-21 NOTE — Telephone Encounter (Signed)
There is already an approval for this medication on file that is supposed to be good through April 2018. ( see scanned letter) I did submit an appeal letter asking to approve this med when approval expires.Faxed appeal letter along with scanned letter that states approval good through 04/18

## 2016-12-23 NOTE — Telephone Encounter (Signed)
Response back from the insurance company in re appeal was that the patient changed health groups and to submit a PA in order to try to get med approved. Submitted through covermymeds.Key: [id number]   (phone # [phone])

## 2016-12-29 NOTE — Telephone Encounter (Signed)
Approvedtoday  CaseId:[id number];Status:Approved;Review Type:Prior Auth;Coverage Start Date:12/29/2016;Coverage End Date:12/29/2017  called pharm and notified patient

## 2017-03-01 ENCOUNTER — Ambulatory Visit (INDEPENDENT_AMBULATORY_CARE_PROVIDER_SITE_OTHER): Payer: BLUE CROSS/BLUE SHIELD | Admitting: Family Medicine

## 2017-03-01 ENCOUNTER — Encounter: Payer: Self-pay | Admitting: Family Medicine

## 2017-03-01 ENCOUNTER — Other Ambulatory Visit: Payer: Self-pay | Admitting: Family Medicine

## 2017-03-01 VITALS — BP 134/82 | HR 98 | Ht 61.0 in | Wt 182.0 lb

## 2017-03-01 DIAGNOSIS — I10 Essential (primary) hypertension: Secondary | ICD-10-CM

## 2017-03-01 DIAGNOSIS — Z13220 Encounter for screening for lipoid disorders: Secondary | ICD-10-CM | POA: Diagnosis not present

## 2017-03-01 DIAGNOSIS — K760 Fatty (change of) liver, not elsewhere classified: Secondary | ICD-10-CM | POA: Diagnosis not present

## 2017-03-01 DIAGNOSIS — E118 Type 2 diabetes mellitus with unspecified complications: Secondary | ICD-10-CM

## 2017-03-01 LAB — COMPLETE METABOLIC PANEL WITH GFR
ALBUMIN: 3.8 g/dL (ref 3.6–5.1)
ALT: 51 U/L — AB (ref 6–29)
AST: 46 U/L — ABNORMAL HIGH (ref 10–35)
Alkaline Phosphatase: 90 U/L (ref 33–130)
BILIRUBIN TOTAL: 0.5 mg/dL (ref 0.2–1.2)
BUN: 6 mg/dL — AB (ref 7–25)
CALCIUM: 9.2 mg/dL (ref 8.6–10.4)
CO2: 24 mmol/L (ref 20–31)
CREATININE: 0.52 mg/dL (ref 0.50–1.05)
Chloride: 106 mmol/L (ref 98–110)
GFR, Est Non African American: >89 mL/min (ref >=60)
Glucose, Bld: 225 mg/dL — ABNORMAL HIGH (ref 65–99)
Potassium: 4.4 mmol/L (ref 3.5–5.3)
Sodium: 142 mmol/L (ref 135–146)
TOTAL PROTEIN: 6.5 g/dL (ref 6.1–8.1)

## 2017-03-01 LAB — LIPID PANEL W/REFLEX DIRECT LDL
CHOLESTEROL: 160 mg/dL (ref <200)
HDL: 32 mg/dL — ABNORMAL LOW (ref >50)
LDL-Cholesterol: 109 mg/dL — ABNORMAL HIGH
Non-HDL Cholesterol (Calc): 128 mg/dL (ref <130)
TRIGLYCERIDES: 92 mg/dL (ref <150)
Total CHOL/HDL Ratio: 5 Ratio — ABNORMAL HIGH (ref <5.0)

## 2017-03-01 LAB — POCT GLYCOSYLATED HEMOGLOBIN (HGB A1C): Hemoglobin A1C: 8.2

## 2017-03-01 MED ORDER — SAXAGLIPTIN-METFORMIN ER 5-1000 MG PO TB24: 1.0000 | ORAL_TABLET | Freq: Every day | ORAL | 1 refills | Status: DC

## 2017-03-01 NOTE — Patient Instructions (Addendum)
Lantus 40 units twice a day.  Call me in 2 weeks and let me know how your sugars are doing. We can always change you to [place].

## 2017-03-01 NOTE — Progress Notes (Signed)
Subjective:    CC: DM. HTN  HPI:  Diabetes - no hypoglycemic events. No wounds or sores that are not healing well. No increased thirst or urination. Checking glucose at home. Taking medications as prescribed without any side effects.  Hypertension- Pt denies chest pain, SOB, dizziness, or heart palpitations.  Taking meds as directed w/o problems.  Denies medication side effects.    Fatty liver - No specific work and concerns. She just really is having hard time losing weight. She said she started exercising more and really has been more cautious about what she's been eating. Her son is also getting married in September shows she really wants to be able to lose a little bit of weight.  Past medical history, Surgical history, Family history not pertinant except as noted below, Social history, Allergies, and medications have been entered into the medical record, reviewed, and corrections made.   Review of Systems: No fevers, chills, night sweats, weight loss, chest pain, or shortness of breath.   Objective:    General: Well Developed, well nourished, and in no acute distress.  Neuro: Alert and oriented x3, extra-ocular muscles intact, sensation grossly intact.  HEENT: Normocephalic, atraumatic, No thyromegaly Skin: Warm and dry, no rashes. Cardiac: Regular rate and rhythm, no murmurs rubs or gallops, no lower extremity edema.  Respiratory: Clear to auscultation bilaterally. Not using accessory muscles, speaking in full sentences.   Impression and Recommendations:    DM- Uncontrolled.  Increase Lantus to 40 units twice a day. She is to call me in 2 weeks and let me know what her blood sugars are doing. We discussed several really want her to work on losing about 10 pounds. I think would make a big difference with her insulin resistance. We can also consider adding mealtime insulin if needed. Continue with Kombiglyze. She has eye exam scheduled for July. Lab Results  Component Value Date   HGBA1C 8.2 03/01/2017    Reminded due for eye exam. She has scheduled for July.     Fatty liver - due to recheck liver enzymes. We discussed strategies for weight loss including actually tracking calories with a smart phone application called my fitness pal and or maybe even trying Weight Watchers.  HTN - Well controlled. Continue current regimen. Follow up in  6 months.

## 2017-03-04 ENCOUNTER — Telehealth: Payer: Self-pay

## 2017-03-04 NOTE — Telephone Encounter (Signed)
Kidney function is stable. Liver enzymes are elevated. Avoid any excess Tylenol or alcohol products and recheck in one month. Work on [employer] as well as regular exercise to reduce any inflammation the liver. LDL cholesterol just borderline elevated. Glucose was over 200.

## 2017-03-04 NOTE — Telephone Encounter (Signed)
Pt called for results of labs taken Monday.  I do not see where you have commented on then.  Please advise.

## 2017-03-05 NOTE — Telephone Encounter (Signed)
Pt advised of results, verbalized understanding. No further questions.  

## 2017-03-09 ENCOUNTER — Other Ambulatory Visit: Payer: Self-pay

## 2017-03-09 MED ORDER — AMBULATORY NON FORMULARY MEDICATION: 1.0000 | Freq: Two times a day (BID) | 5 refills | Status: DC

## 2017-03-16 ENCOUNTER — Telehealth: Payer: Self-pay | Admitting: *Deleted

## 2017-03-16 NOTE — Telephone Encounter (Signed)
Called pt and informed her that this information has been sent to pcp. She also wanted me to inform Dr. Madilyn Fireman that she has changed eating,and has been exercising. Terri Zimmerman, [place] Crocker

## 2017-03-16 NOTE — Telephone Encounter (Signed)
Pt given recommendations.Terri Zimmerman

## 2017-03-16 NOTE — Telephone Encounter (Signed)
Pt called and lvm stating that she has been monitoring her BS for the past 2 wks like Dr. Madilyn Fireman asked her to and she reports that they have been running over 120 most days she states that they have been in the 160-170 range. She is taking 40 units of lantus in the morning and in the evening.   Looked back at the last OV note as per Dr. Gardiner Ramus note:  Lantus 40 units twice a day.  Call me in 2 weeks and let me know how your sugars are doing. We can always change you to Toujeo.   Will fwd to pcp for recommendations.Audelia Hives Peerless

## 2017-03-16 NOTE — Telephone Encounter (Signed)
Ok, good. Increas Lantus to 42 units BI.  Call again in about 2 weeks after change.

## 2017-04-01 IMAGING — CR DG CHEST 2V
2 series · 2 of 2 positions shown · non-contrast
Comparison: 08/22/2009 and earlier.

CLINICAL DATA: Smoker with 3 week history of cough. Current history
of diabetes and hypertension.

EXAM:
CHEST  2 VIEW

[chest pa]
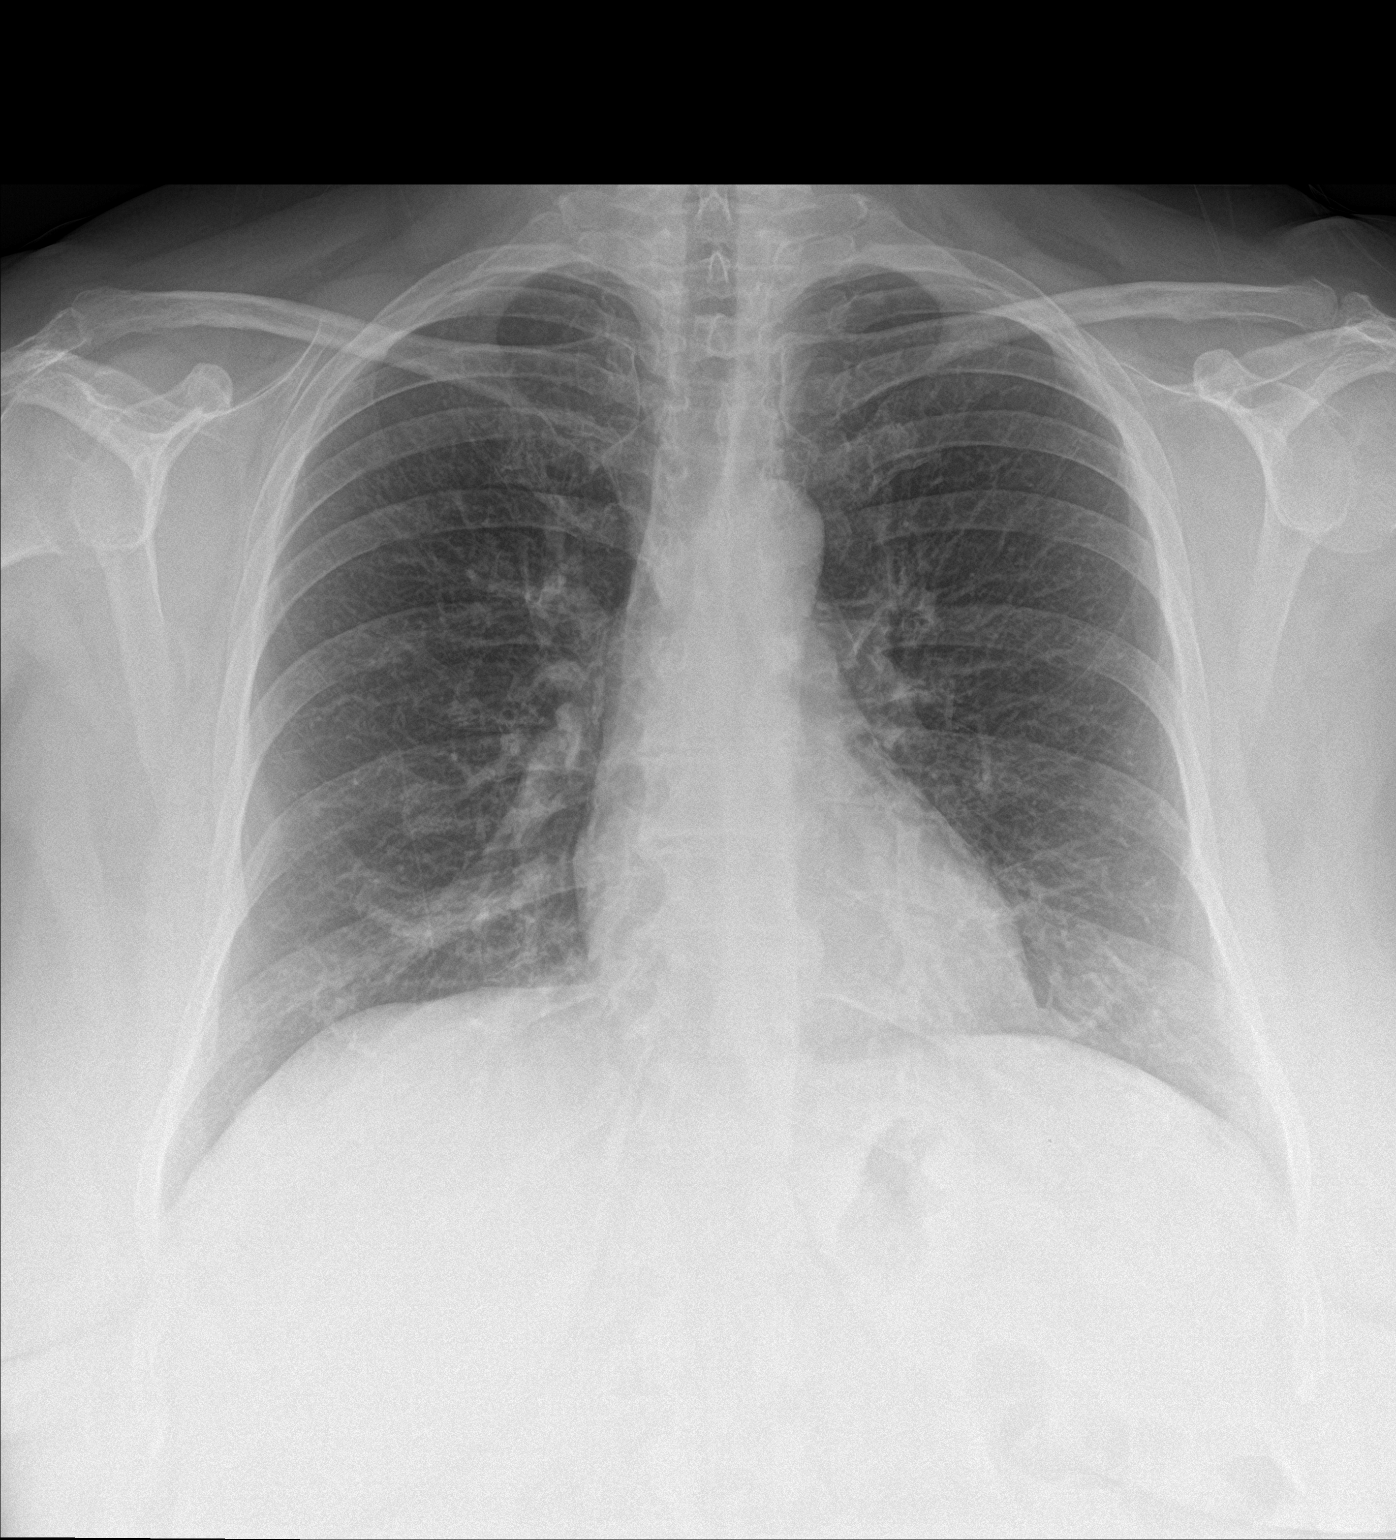

[chest lat]
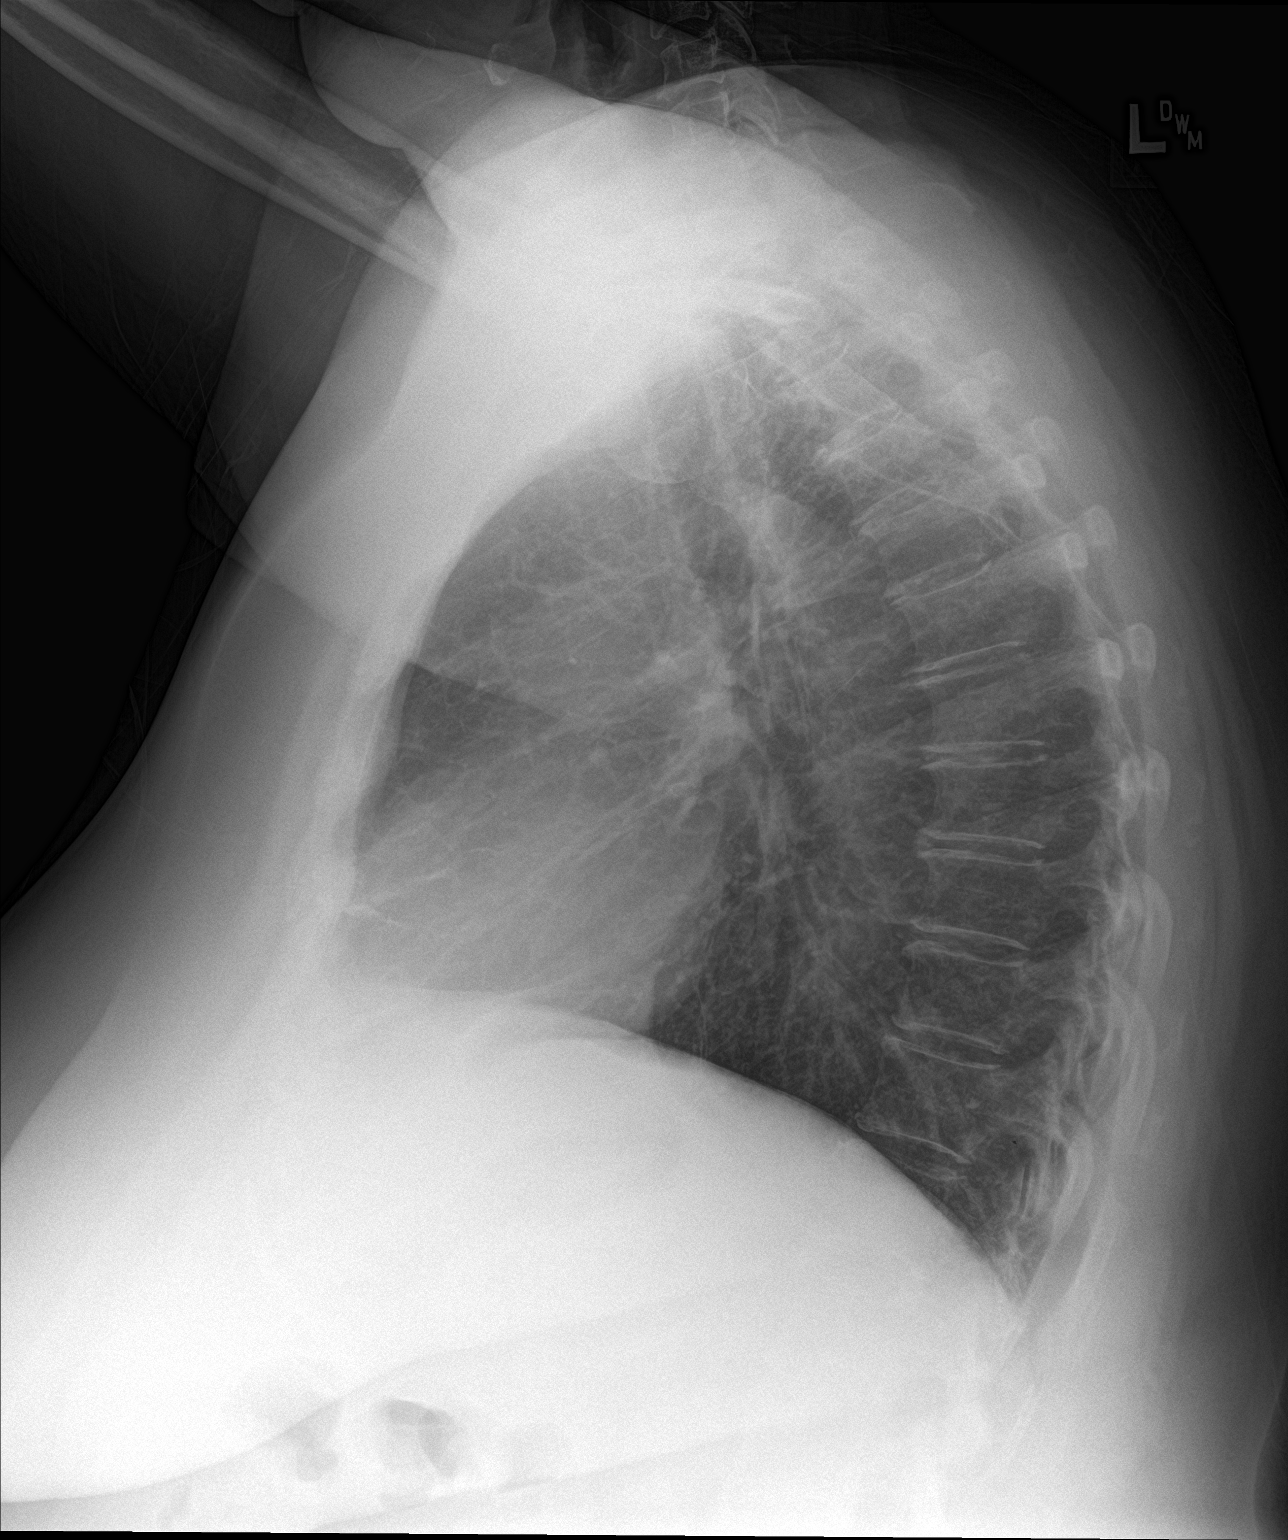

[2 of 2 positions shown; findings below may reference images not displayed]

FINDINGS: Cardiomediastinal silhouette unremarkable, unchanged. Mildly
prominent bronchovascular markings diffusely and mild central
peribronchial thickening, more so than on the prior examination.
Minimal linear scar or atelectasis in the lingula. Lungs otherwise
clear. No localized airspace consolidation. No pleural effusions. No
pneumothorax. Normal pulmonary vascularity. Degenerative disc
disease and spondylosis involving the mid and lower thoracic spine.
IMPRESSION: Mild changes of acute bronchitis and/or asthma without focal
airspace pneumonia.

## 2017-04-05 ENCOUNTER — Ambulatory Visit (INDEPENDENT_AMBULATORY_CARE_PROVIDER_SITE_OTHER): Payer: BLUE CROSS/BLUE SHIELD | Admitting: Sports Medicine

## 2017-04-05 ENCOUNTER — Encounter: Payer: Self-pay | Admitting: Sports Medicine

## 2017-04-05 DIAGNOSIS — M654 Radial styloid tenosynovitis [de Quervain]: Secondary | ICD-10-CM | POA: Diagnosis not present

## 2017-04-05 NOTE — Progress Notes (Signed)
  Subjective:    CC: left wrist pain  HPI: Terri Zimmerman is a 51 y.o. Female with a history of De Quervain's tenosynovitis who presents with left wrist pain. The pain started about a month ago and is pretty constant but is worse with use. She has been using the brace pretty consistently over the past month while at work and when she goes to sleep. The pain radiates to the lateral epicondyle.  Past medical history:  Negative.  See flowsheet/record as well for more information.  Surgical history: Negative.  See flowsheet/record as well for more information.  Family history: Negative.  See flowsheet/record as well for more information.  Social history: Negative.  See flowsheet/record as well for more information.  Allergies, and medications have been entered into the medical record, reviewed, and no changes needed.   Review of Systems: No fevers, chills, night sweats, weight loss, chest pain, or shortness of breath.   Objective:    General: Well Developed, well nourished, and in no acute distress.  Neuro: Alert and oriented x3, extra-ocular muscles intact, sensation grossly intact.  HEENT: Normocephalic, atraumatic, pupils equal round reactive to light, neck supple, no masses, no lymphadenopathy, thyroid nonpalpable.  Skin: Warm and dry, no rashes. Cardiac: Regular rate and rhythm, no murmurs rubs or gallops, no lower extremity edema.  Respiratory: Clear to auscultation bilaterally. Not using accessory muscles, speaking in full sentences. Left Wrist: Lateral wrist swollen, no erythema. TTP over the extensor retinaculum. No joint or bone tenderness to palpation. Thumb extension reduced ROM compared to right hand. Full ROM thumb flexion, opposition, abduction, adduction. Positive Finkelstein.   Impression and Recommendations:    Terri Zimmerman is a 51 y.o. Female who presents for left De Quervain's tenosynovitis.  Last injection was 1 year ago. Steroid injection placed. Follow-up as needed.

## 2017-04-05 NOTE — Assessment & Plan Note (Signed)
One year response to previous injection, now having recurrence of pain, repeat left first extensor compartment injection as above, return to see me as needed.

## 2017-04-15 ENCOUNTER — Ambulatory Visit: Payer: BLUE CROSS/BLUE SHIELD | Admitting: Family Medicine

## 2017-04-29 ENCOUNTER — Ambulatory Visit (INDEPENDENT_AMBULATORY_CARE_PROVIDER_SITE_OTHER): Payer: BLUE CROSS/BLUE SHIELD | Admitting: Family Medicine

## 2017-04-29 ENCOUNTER — Encounter: Payer: Self-pay | Admitting: Family Medicine

## 2017-04-29 VITALS — BP 128/81 | HR 97 | Ht 61.02 in | Wt 180.0 lb

## 2017-04-29 DIAGNOSIS — E118 Type 2 diabetes mellitus with unspecified complications: Secondary | ICD-10-CM

## 2017-04-29 DIAGNOSIS — I10 Essential (primary) hypertension: Secondary | ICD-10-CM | POA: Diagnosis not present

## 2017-04-29 MED ORDER — AMBULATORY NON FORMULARY MEDICATION: 12 refills | Status: DC

## 2017-04-29 NOTE — Addendum Note (Signed)
Addended by: Teddy Spike on: 04/29/2017 10:55 AM   Modules accepted: Orders

## 2017-04-29 NOTE — Patient Instructions (Signed)
Increase Lantus to 45 units twice a day for 3 days Then increase by 1 unit with each shot every other day. Keep going until your morning fasting sugars are between 80-120.

## 2017-04-29 NOTE — Progress Notes (Signed)
Subjective:    CC: DM  HPI:  Diabetes - no hypoglycemic events. No wounds or sores that are not healing well. No increased thirst or urination. Checking glucose at home. Taking medications as prescribed without any side effects. Ranging from 120-160s at home. No lows.    Hypertension- Pt denies chest pain, SOB, dizziness, or heart palpitations.  Taking meds as directed w/o problems.  Denies medication side effects.       Past medical history, Surgical history, Family history not pertinant except as noted below, Social history, Allergies, and medications have been entered into the medical record, reviewed, and corrections made.   Review of Systems: No fevers, chills, night sweats, weight loss, chest pain, or shortness of breath.   Objective:    General: Well Developed, well nourished, and in no acute distress.  Neuro: Alert and oriented x3, extra-ocular muscles intact, sensation grossly intact.  HEENT: Normocephalic, atraumatic  Skin: Warm and dry, no rashes. Cardiac: Regular rate and rhythm, no murmurs rubs or gallops, no lower extremity edema.  Respiratory: Clear to auscultation bilaterally. Not using accessory muscles, speaking in full sentences.   Impression and Recommendations:    DM- Uncontrolled. Increase Lantus to 45 units twice a day for 3 days Then increase by 1 unit with each shot every other day. Keep going until your morning fasting sugars are between 80-120. Has eye appt tomorrow.   HTN - well controlled today.

## 2017-04-30 DIAGNOSIS — E119 Type 2 diabetes mellitus without complications: Secondary | ICD-10-CM | POA: Diagnosis not present

## 2017-04-30 LAB — HM DIABETES EYE EXAM

## 2017-05-04 DIAGNOSIS — Z79899 Other long term (current) drug therapy: Secondary | ICD-10-CM | POA: Diagnosis not present

## 2017-05-04 DIAGNOSIS — Z794 Long term (current) use of insulin: Secondary | ICD-10-CM | POA: Diagnosis not present

## 2017-05-04 DIAGNOSIS — R079 Chest pain, unspecified: Secondary | ICD-10-CM | POA: Diagnosis not present

## 2017-05-04 DIAGNOSIS — F17210 Nicotine dependence, cigarettes, uncomplicated: Secondary | ICD-10-CM | POA: Diagnosis not present

## 2017-05-04 DIAGNOSIS — R0789 Other chest pain: Secondary | ICD-10-CM | POA: Diagnosis not present

## 2017-05-04 DIAGNOSIS — I10 Essential (primary) hypertension: Secondary | ICD-10-CM | POA: Diagnosis not present

## 2017-05-04 DIAGNOSIS — E1165 Type 2 diabetes mellitus with hyperglycemia: Secondary | ICD-10-CM | POA: Diagnosis not present

## 2017-05-04 DIAGNOSIS — R600 Localized edema: Secondary | ICD-10-CM | POA: Diagnosis not present

## 2017-05-05 DIAGNOSIS — I340 Nonrheumatic mitral (valve) insufficiency: Secondary | ICD-10-CM | POA: Diagnosis not present

## 2017-05-05 DIAGNOSIS — I519 Heart disease, unspecified: Secondary | ICD-10-CM | POA: Diagnosis not present

## 2017-05-05 DIAGNOSIS — R079 Chest pain, unspecified: Secondary | ICD-10-CM | POA: Diagnosis not present

## 2017-05-14 ENCOUNTER — Other Ambulatory Visit: Payer: Self-pay | Admitting: Family Medicine

## 2017-05-24 ENCOUNTER — Telehealth: Payer: Self-pay

## 2017-05-24 MED ORDER — INSULIN GLARGINE 100 UNIT/ML SOLOSTAR PEN: 50.0000 [IU] | PEN_INJECTOR | Freq: Two times a day (BID) | SUBCUTANEOUS | 5 refills | Status: DC

## 2017-05-24 NOTE — Telephone Encounter (Signed)
[facility] called about pt lantus. They are needing a new Rx with new directions.  I confirmed with pt and she stated that she is injecting 47 units once in the morning and once at night.  She said that she is struggling with trying to get her blood sugar where it needs to be. Please advise. -EH/RMA

## 2017-05-24 NOTE — Telephone Encounter (Signed)
Pt notified -EH/RMA  

## 2017-05-24 NOTE — Telephone Encounter (Signed)
OK, new Rx sent for up to 50 units BID, incase she needs to go up.

## 2017-05-26 IMAGING — CR DG CHEST 2V
2 series · 2 of 2 positions shown · non-contrast
Comparison: PA and lateral chest x-ray October 25, 2015.

CLINICAL DATA: Three months of cough and chest congestion, history
of asthma, current smoker.

EXAM:
CHEST  2 VIEW

[chest pa]
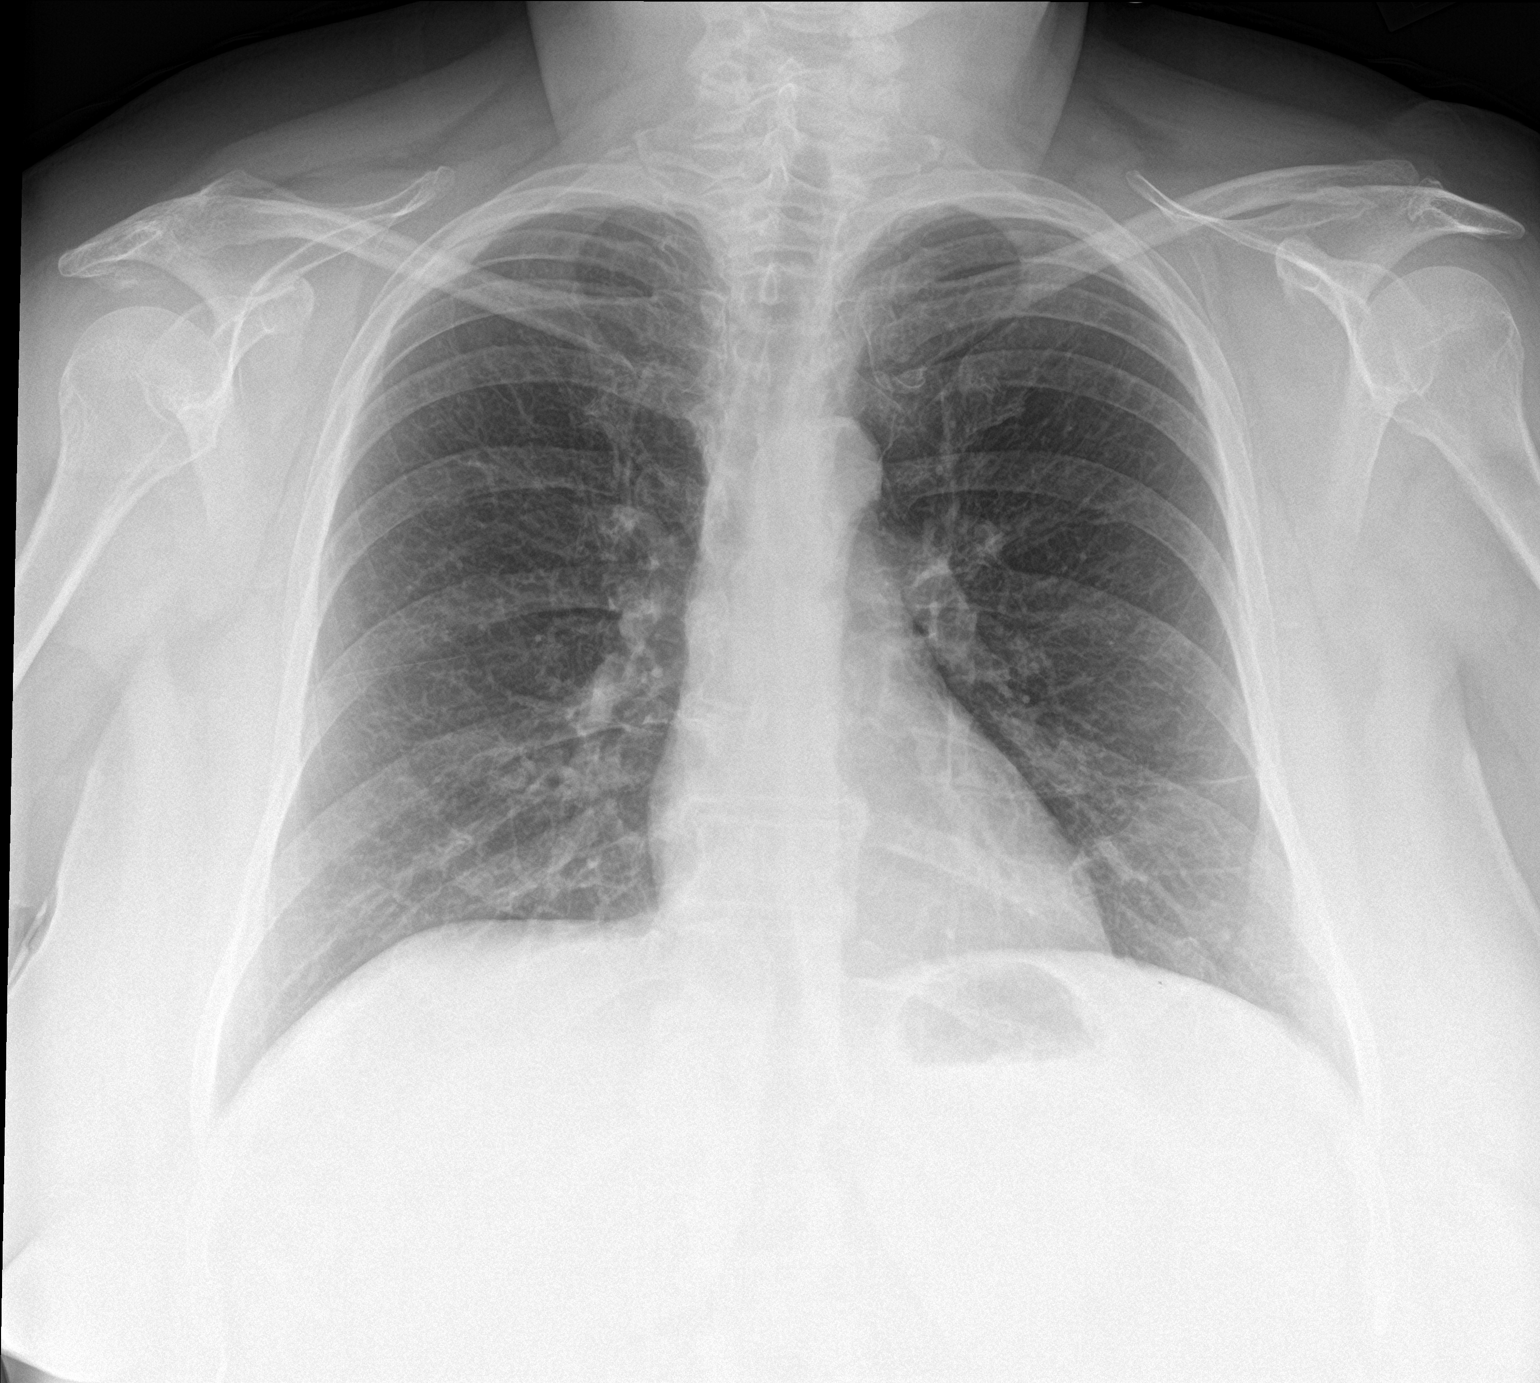

[chest lat]
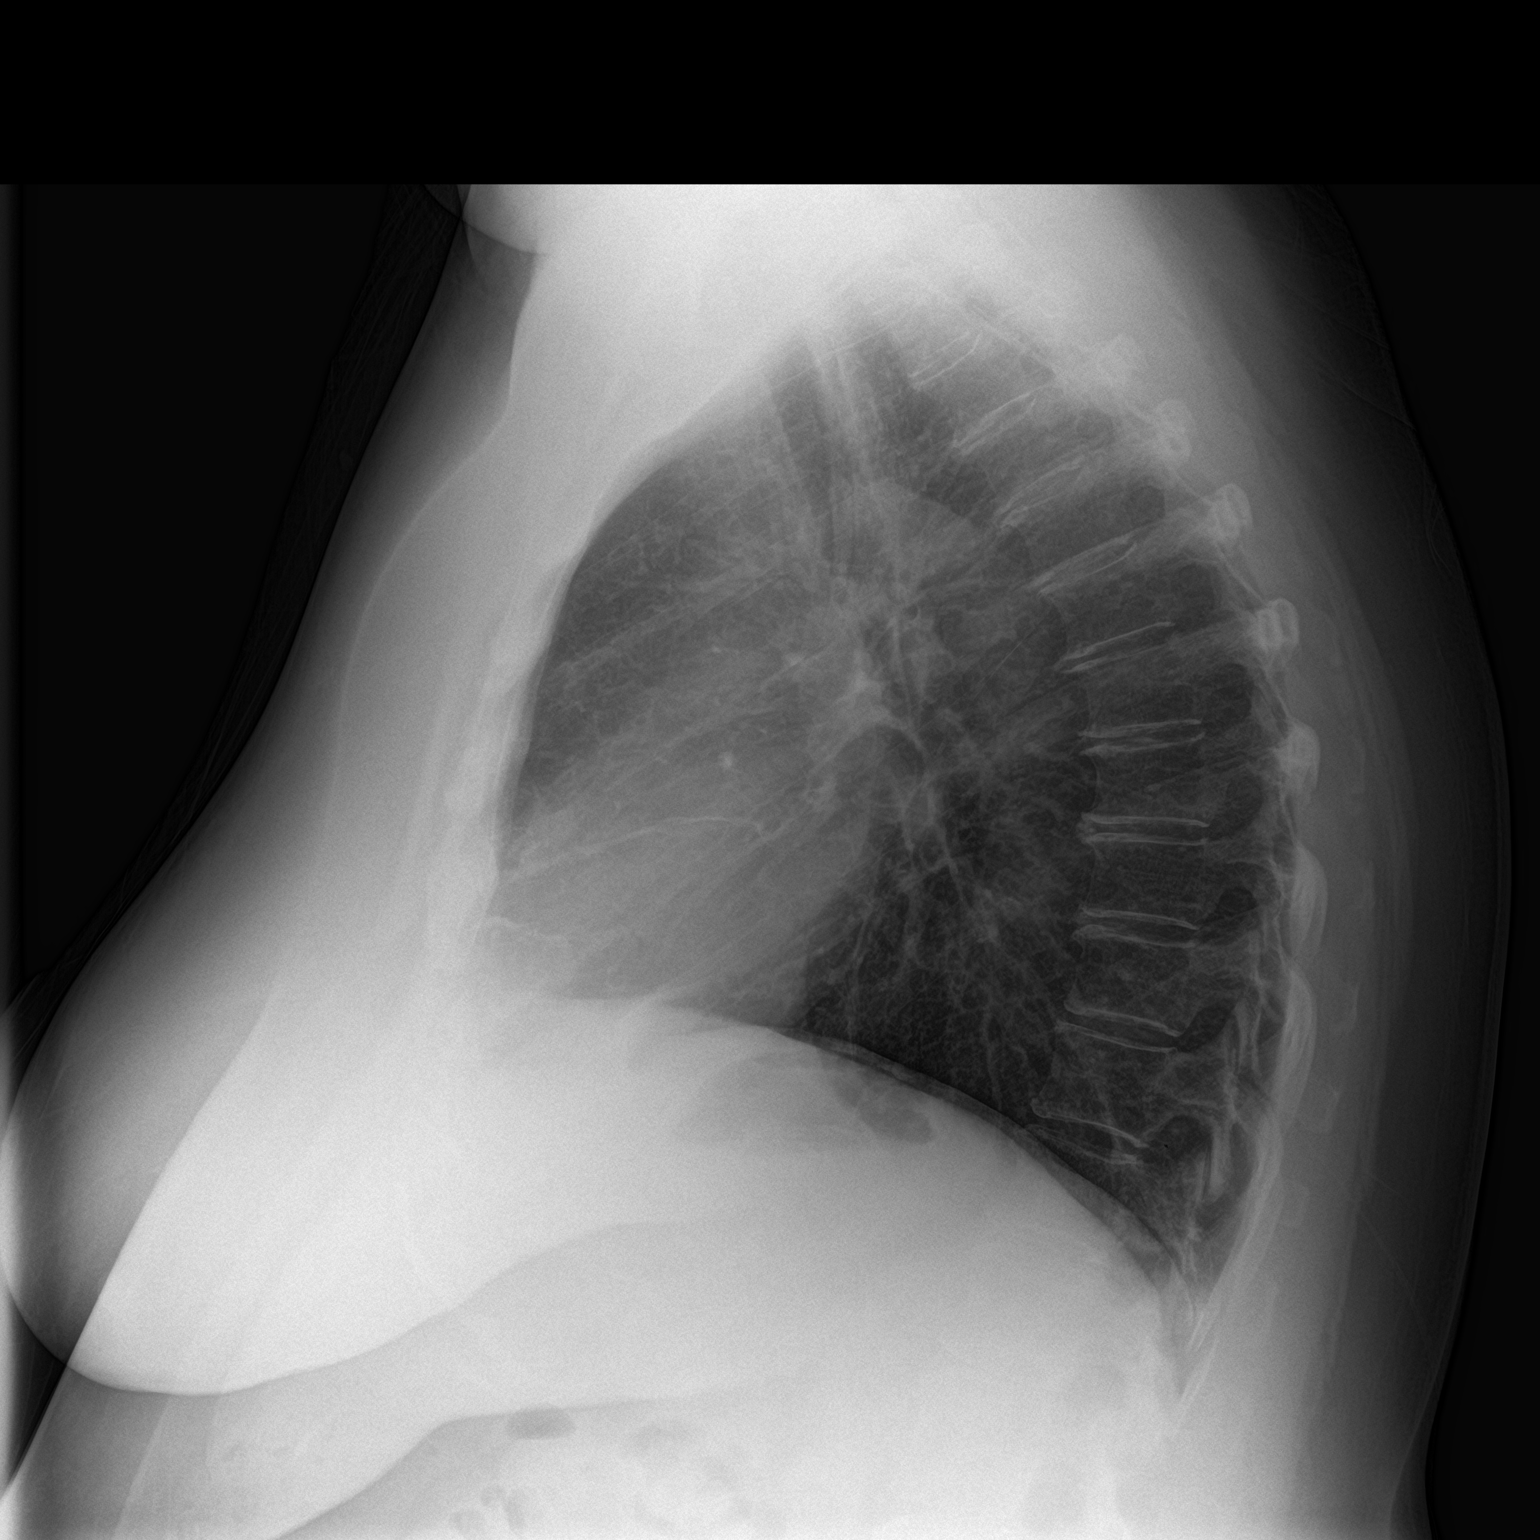

[2 of 2 positions shown; findings below may reference images not displayed]

FINDINGS: The lungs are adequately inflated. The lung markings are coarse in
the infrahilar regions bilaterally but this is stable. There is no
alveolar infiltrate. There is no pleural effusion. The heart and
pulmonary vascularity are normal. The mediastinum is normal in
width. There is very mild multilevel degenerative disc disease of
the thoracic spine.
IMPRESSION: Stable chronic bronchitic changes. There is no acute pneumonia nor
other acute cardiopulmonary abnormality.

## 2017-06-02 ENCOUNTER — Encounter: Payer: Self-pay | Admitting: Family Medicine

## 2017-06-11 ENCOUNTER — Other Ambulatory Visit: Payer: Self-pay | Admitting: Family Medicine

## 2017-06-11 DIAGNOSIS — Z1239 Encounter for other screening for malignant neoplasm of breast: Secondary | ICD-10-CM

## 2017-06-25 ENCOUNTER — Ambulatory Visit (INDEPENDENT_AMBULATORY_CARE_PROVIDER_SITE_OTHER): Payer: BLUE CROSS/BLUE SHIELD

## 2017-06-25 DIAGNOSIS — Z1239 Encounter for other screening for malignant neoplasm of breast: Secondary | ICD-10-CM

## 2017-06-25 DIAGNOSIS — Z1231 Encounter for screening mammogram for malignant neoplasm of breast: Secondary | ICD-10-CM | POA: Diagnosis not present

## 2017-07-20 ENCOUNTER — Encounter: Payer: Self-pay | Admitting: Family Medicine

## 2017-07-20 ENCOUNTER — Ambulatory Visit (INDEPENDENT_AMBULATORY_CARE_PROVIDER_SITE_OTHER): Payer: BLUE CROSS/BLUE SHIELD | Admitting: Family Medicine

## 2017-07-20 VITALS — BP 135/85 | HR 69 | Wt 185.0 lb

## 2017-07-20 DIAGNOSIS — E118 Type 2 diabetes mellitus with unspecified complications: Secondary | ICD-10-CM

## 2017-07-20 DIAGNOSIS — I10 Essential (primary) hypertension: Secondary | ICD-10-CM | POA: Diagnosis not present

## 2017-07-20 DIAGNOSIS — K760 Fatty (change of) liver, not elsewhere classified: Secondary | ICD-10-CM | POA: Diagnosis not present

## 2017-07-20 DIAGNOSIS — Z23 Encounter for immunization: Secondary | ICD-10-CM | POA: Diagnosis not present

## 2017-07-20 DIAGNOSIS — R1031 Right lower quadrant pain: Secondary | ICD-10-CM | POA: Diagnosis not present

## 2017-07-20 LAB — POCT UA - MICROALBUMIN
Albumin/Creatinine Ratio, Urine, POC: <30
CREATININE, POC: 200 mg/dL
MICROALBUMIN (UR) POC: 30 mg/L

## 2017-07-20 LAB — POCT GLYCOSYLATED HEMOGLOBIN (HGB A1C): HEMOGLOBIN A1C: 7.9

## 2017-07-20 NOTE — Progress Notes (Signed)
Subjective:    CC: DM  HPI:  Diabetes - no hypoglycemic events. No wounds or sores that are not healing well. No increased thirst or urination. Checking glucose at home. Taking medications as prescribed without any side effects.she fell and got up to 57 units on her Toujeo.  Hypertension- Pt denies chest pain, SOB, dizziness, or heart palpitations.  Taking meds as directed w/o problems.  Denies medication side effects.    She also c/o of pain just above the right groin crease x 2 month. No known injury or trauma. NO worsening or alleviating factors. No specific movements make it worse. It just felt sore. She'll notice it particularly if she leans up against the edge of a counter. She does not have an appendix and does not have an ovary on that side. She feels likeher bowels move well though occasionally he'll have some issues.she has not noticed a mass or any swollen area.she says she sometimes has some right flank and back pain with it though typically if she takes Aleve that seems to help her back pain. She does sit at a desk where she is rotated to the side to look at her computer screen.  Past medical history, Surgical history, Family history not pertinant except as noted below, Social history, Allergies, and medications have been entered into the medical record, reviewed, and corrections made.   Review of Systems: No fevers, chills, night sweats, weight loss, chest pain, or shortness of breath.   Objective:    General: Well Developed, well nourished, and in no acute distress.  Neuro: Alert and oriented x3, extra-ocular muscles intact, sensation grossly intact.  HEENT: Normocephalic, atraumatic  Skin: Warm and dry, no rashes. Cardiac: Regular rate and rhythm, no murmurs rubs or gallops, no lower extremity edema.  Respiratory: Clear to auscultation bilaterally. Not using accessory muscles, speaking in full sentences. Abd: soft, normal bowel sounds, just mildly tender right along the right  lower quadrant just above the right groin crease. No palpable lump.   Impression and Recommendations:   DM-  On ACE. A1c down to 7.9 today which is improvement from 8.2.increase to change to 60 units and then continue to increase every couple of days until blood sugars are consistently running under 130 fasting. Lab Results  Component Value Date   HGBA1C 7.9 07/20/2017     HTN - Well controlled. Continue current regimen. Follow up in 3 months.   RIght LQ pain - unclear etiology. She doesn't have an appendix her ovary in that location. No sign of bowl just to indicate a groin hernia. We'll keep an eye on it. Work on gentle stretches ice as needed and inflammatory as needed. Will schedule abdominal US.   Fatty liver-due for repeat ultrasound.

## 2017-07-26 ENCOUNTER — Telehealth: Payer: Self-pay | Admitting: *Deleted

## 2017-07-26 DIAGNOSIS — R1031 Right lower quadrant pain: Secondary | ICD-10-CM

## 2017-07-26 DIAGNOSIS — K760 Fatty (change of) liver, not elsewhere classified: Secondary | ICD-10-CM

## 2017-07-26 NOTE — Telephone Encounter (Signed)
Ultrasound ordered

## 2017-07-31 ENCOUNTER — Inpatient Hospital Stay (HOSPITAL_BASED_OUTPATIENT_CLINIC_OR_DEPARTMENT_OTHER): Admission: RE | Admit: 2017-07-31 | Payer: BLUE CROSS/BLUE SHIELD | Source: Ambulatory Visit

## 2017-08-04 ENCOUNTER — Other Ambulatory Visit: Payer: Self-pay | Admitting: Family Medicine

## 2017-08-31 ENCOUNTER — Other Ambulatory Visit: Payer: Self-pay | Admitting: Family Medicine

## 2017-09-06 ENCOUNTER — Ambulatory Visit (INDEPENDENT_AMBULATORY_CARE_PROVIDER_SITE_OTHER): Payer: BLUE CROSS/BLUE SHIELD | Admitting: Osteopathic Medicine

## 2017-09-06 ENCOUNTER — Encounter: Payer: Self-pay | Admitting: Osteopathic Medicine

## 2017-09-06 VITALS — BP 148/85 | HR 100 | Temp 98.3°F | Ht 61.0 in | Wt 191.0 lb

## 2017-09-06 DIAGNOSIS — B9789 Other viral agents as the cause of diseases classified elsewhere: Secondary | ICD-10-CM | POA: Diagnosis not present

## 2017-09-06 DIAGNOSIS — J069 Acute upper respiratory infection, unspecified: Secondary | ICD-10-CM | POA: Diagnosis not present

## 2017-09-06 DIAGNOSIS — J029 Acute pharyngitis, unspecified: Secondary | ICD-10-CM | POA: Diagnosis not present

## 2017-09-06 LAB — POCT RAPID STREP A (OFFICE): Rapid Strep A Screen: NEGATIVE

## 2017-09-06 MED ORDER — GUAIFENESIN-CODEINE 100-10 MG/5ML PO SYRP: 5.0000 mL | ORAL_SOLUTION | Freq: Four times a day (QID) | ORAL | 0 refills | Status: DC | PRN

## 2017-09-06 MED ORDER — IPRATROPIUM BROMIDE 0.06 % NA SOLN: 2.0000 | Freq: Four times a day (QID) | NASAL | 1 refills | Status: DC | PRN

## 2017-09-06 MED ORDER — METHYLPREDNISOLONE 4 MG PO TBPK: ORAL_TABLET | ORAL | 0 refills | Status: DC

## 2017-09-06 NOTE — Progress Notes (Signed)
HPI: Terri Zimmerman is a 51 y.o. female who presents to [facility]  today for chief complaint of:  Chief Complaint  Patient presents with  . Ear Pain    bilateral  . Sore Throat   URI symptoms  . Location/Quality: sinuses pressure, sore throat, L ear pain  . Duration: 1 day  . Modifying factors: has tried the following OTC medications: Robitussin without relief  Past medical, social and family history reviewed: Past Medical History:  Diagnosis Date  . Asthma 04-25-07  . Diabetes mellitus 11-12-06   type 2  . Hypertension 11-04-06   Past Surgical History:  Procedure Laterality Date  . ABDOMINAL HYSTERECTOMY  12-11   Complete  . APPENDECTOMY  9-00  . BREAST SURGERY  4-00   of cyst  . FOOT TENDON SURGERY  05/16/2012   bone spur.    Marland Kitchen LAPAROSCOPY  4-08   for cyst and endometriosis  . left breast bx  12-11   Social History  Substance Use Topics  . Smoking status: Current Every Day Smoker    Packs/day: 0.50    Types: Cigarettes  . Smokeless tobacco: Never Used  . Alcohol use No   Family History  Problem Relation Age of Onset  . Other Mother        RA and CHF  . Diabetes Father   . Diabetes Other   . Heart disease Other     Current Outpatient Prescriptions  Medication Sig Dispense Refill  . AMBULATORY NON FORMULARY MEDICATION One Touch Ultra Test Strips.  Diagnosis: DM  Testing 2 times a day 100 each 5  . AMBULATORY NON FORMULARY MEDICATION 1 each by Other route 2 (two) times daily. Lancets for daily glucose checks Diagnosis: DM Testing 2 times a day 100 each 5  . AMBULATORY NON FORMULARY MEDICATION Medication Name: insulin pen needle 25G X 6MM for use for injecting insulin. DX E11.8 100 each 12  . glucose blood (ONE TOUCH ULTRA TEST) test strip Test blood sugar once daily. Diagnosis Diabetes ICD-10 E11.8. 100 each prn  . Insulin Glargine (LANTUS SOLOSTAR) 100 UNIT/ML Solostar Pen Inject 50 Units into the skin 2 (two) times daily. 10  pen 5  . KOMBIGLYZE XR 03-999 MG TB24 TAKE 1 TABLET BY MOUTH DAILY 90 tablet 0  . lisinopril (PRINIVIL,ZESTRIL) 20 MG tablet TAKE 1 TABLET BY MOUTH DAILY 90 tablet 0  . naproxen sodium (ANAPROX) 220 MG tablet Take 220 mg by mouth 2 (two) times daily with a meal. Patient used this medication for pain.    . NON FORMULARY One Touch Ultra View test strips- 2 bottles     . NON FORMULARY Glucometer, strips, and lancets to test daily DX:250.00     . NOVOFINE 32G X 6 MM MISC USE TO INJECT INSULIN UP TO THREE TIMES A DAY 100 each prn   No current facility-administered medications for this visit.    Allergies  Allergen Reactions  . Trulicity [Dulaglutide] Nausea Only    Abdominal pain,  . Farxiga [Dapagliflozin]     foggy  . Invokana [Canagliflozin] Other (See Comments)    Back pain  . Jentadueto [Linagliptin-Metformin Hcl Er]     Abdominal pains and diarrhea  . Lipitor [Atorvastatin]     Sudden hair loss 2 weeks after starting lipitor.   Lilienne Bernard [Liraglutide] Other (See Comments)    Abdominal pain and nausea.       Review of Systems: CONSTITUTIONAL: no fever/chills HEAD/EYES/EARS/NOSE/THROAT: no headache,  no vision change or hearing change, yes sore throat, yes ear fullness yes sinus pressure  CARDIAC: No chest pain/pressure/palpitations, no orthopnea RESPIRATORY: yes cough, no shortness of breath, occsaional wheeze GASTROINTESTINAL: no nausea, no vomiting, no abdominal pain/blood in stool/diarrhea/constipation MUSCULOSKELETAL: no myalgia/arthralgia   Exam:  BP (!) 148/85   Pulse 100   Temp 98.3 F (36.8 C) (Oral)   Ht 5\' 1"  (1.549 m)   Wt 191 lb (86.6 kg)   BMI 36.09 kg/m  Constitutional: VSS, see above. General Appearance: alert, well-developed, well-nourished, NAD Eyes: Normal lids and conjunctive, non-icteric sclera, PERRLA Ears, Nose, Mouth, Throat: Normal external inspection ears/nares/mouth/lips/gums, normal TM, MMM; posterior pharynx without erythema, without  exudate Neck: No masses, trachea midline. No thyroid enlargement/tenderness/mass appreciated, normal lymph nodes Respiratory: Normal respiratory effort. No  wheeze/rhonchi/rales Cardiovascular: S1/S2 normal, no murmur/rub/gallop auscultated. RRR. No carotid bruit or JVD. No lower extremity edema.   Results for orders placed or performed in visit on 09/06/17 (from the past 72 hour(s))  POCT rapid strep A     Status: None   Collection Time: 09/06/17 10:49 AM  Result Value Ref Range   Rapid Strep A Screen Negative Negative      ASSESSMENT/PLAN: Viral uri presenting on day 2 of illness - symptomatic treatment and advised call office if no better 7-10 days, sooner if worse/change  Sore throat - Plan: POCT rapid strep A  Viral URI with cough - Plan: ipratropium (ATROVENT) 0.06 % nasal spray, guaiFENesin-codeine (ROBITUSSIN AC) 100-10 MG/5ML syrup, methylPREDNISolone (MEDROL DOSEPAK) 4 MG TBPK tablet   Return if symptoms worsen or fail to improve.

## 2017-09-06 NOTE — Patient Instructions (Signed)
Over-the-Counter Medications & Home Remedies for Upper Respiratory Illness  Note: the following list assumes no pregnancy, normal liver & kidney function and no other drug interactions. Dr. Sheppard Coil has highlighted medications which are safe for you to use, but these may not be appropriate for everyone. Always ask a pharmacist or qualified medical provider if you have any questions!   Aches/Pains, Fever, Headache Acetaminophen (Tylenol) 500 mg tablets - take max 2 tablets (1000 mg) every 6 hours (4 times per day)  Ibuprofen (Motrin) 200 mg tablets - take max 4 tablets (800 mg) every 6 hours*  Sinus Congestion Prescription Atrovent as directed Nasal Saline if desired to rinse Phenylephrine (Sudafed) 10 mg tablets every 4 hours (or the 12-hour formulation)* Diphenhydramine (Benadryl) 25 mg tablets - take max 2 tablets every 4 hours  Cough & Sore Throat Prescription cough pills or syrups as directed Dextromethorphan (Robitussin, others) - cough suppressant Guaifenesin (Robitussin, Mucinex, others) - expectorant (helps cough up mucus) (Dextromethorphan and Guaifenesin also come in a combination tablet) Lozenges w/ Benzocaine + Menthol (Cepacol) Honey - as much as you want! Teas which "coat the throat" - look for ingredients Elm Bark, Licorice Root, Marshmallow Root  Other Antibiotics if these are prescribed - take ALL, even if you're feeling better  Zinc Lozenges within 24 hours of symptoms onset - mixed evidence this shortens the duration of the common cold Don't waste your money on Vitamin C or Echinacea  *Caution in patients with high blood pressure

## 2017-09-08 ENCOUNTER — Ambulatory Visit (INDEPENDENT_AMBULATORY_CARE_PROVIDER_SITE_OTHER): Payer: BLUE CROSS/BLUE SHIELD

## 2017-09-08 ENCOUNTER — Encounter: Payer: Self-pay | Admitting: Osteopathic Medicine

## 2017-09-08 ENCOUNTER — Ambulatory Visit (INDEPENDENT_AMBULATORY_CARE_PROVIDER_SITE_OTHER): Payer: BLUE CROSS/BLUE SHIELD | Admitting: Osteopathic Medicine

## 2017-09-08 VITALS — BP 113/74 | HR 72 | Temp 98.2°F | Ht 61.0 in | Wt 189.0 lb

## 2017-09-08 DIAGNOSIS — B9789 Other viral agents as the cause of diseases classified elsewhere: Secondary | ICD-10-CM | POA: Diagnosis not present

## 2017-09-08 DIAGNOSIS — R0602 Shortness of breath: Secondary | ICD-10-CM | POA: Diagnosis not present

## 2017-09-08 DIAGNOSIS — J069 Acute upper respiratory infection, unspecified: Secondary | ICD-10-CM

## 2017-09-08 DIAGNOSIS — R0989 Other specified symptoms and signs involving the circulatory and respiratory systems: Secondary | ICD-10-CM | POA: Diagnosis not present

## 2017-09-08 DIAGNOSIS — J218 Acute bronchiolitis due to other specified organisms: Secondary | ICD-10-CM

## 2017-09-08 MED ORDER — POCKET SPACER DEVI: 99 refills | Status: DC

## 2017-09-08 MED ORDER — ALBUTEROL SULFATE HFA 108 (90 BASE) MCG/ACT IN AERS: 1.0000 | INHALATION_SPRAY | Freq: Four times a day (QID) | RESPIRATORY_TRACT | 2 refills | Status: DC | PRN

## 2017-09-08 MED ORDER — IPRATROPIUM-ALBUTEROL 0.5-2.5 (3) MG/3ML IN SOLN
3.0000 mL | Freq: Once | RESPIRATORY_TRACT | Status: AC
  Administered 2017-09-08: 3 mL via RESPIRATORY_TRACT

## 2017-09-08 MED ORDER — AZITHROMYCIN 250 MG PO TABS: ORAL_TABLET | ORAL | 0 refills | Status: DC

## 2017-09-08 MED ORDER — PREDNISONE 20 MG PO TABS: 20.0000 mg | ORAL_TABLET | Freq: Every day | ORAL | 0 refills | Status: DC

## 2017-09-08 MED ORDER — GUAIFENESIN-DM 100-10 MG/5ML PO SYRP: 5.0000 mL | ORAL_SOLUTION | ORAL | 1 refills | Status: DC | PRN

## 2017-09-08 NOTE — Patient Instructions (Addendum)
Plan:  Lower dose of alternate steroids  Inhaler   Cough medicine - sent alternative, stop the stuff with codeine   Nasal spray - continue   Printed antibiotics - only fill these if your symptoms are still bothering you despite steroids and inhaler.   I'll call you if plan changes based on radiology official report of Xray

## 2017-09-08 NOTE — Progress Notes (Signed)
HPI: Terri Zimmerman is a 51 y.o. female who presents to [place]  today for chief complaint of:  Chief Complaint  Patient presents with  . Cough   Seen in the office by myself 2 days ago for URI symptoms duration of one day at that time. Sore throat and dry cough, sinus pressure, ear discomfort on the left. She had tried Robitussin without relief. Was prescribed Atrovent nasal spray, Robitussin-AC syrup, Medrol Dosepak  Today, reports feeling like symptoms have "gone into the chest." Had only taken one dose of the steroids, caused heart racing and anxiety, Cough meds caused some fogginess. Still smoking. No chest pain. (+)cough feels worse.    Past medical, social and family history reviewed: Past Medical History:  Diagnosis Date  . Asthma 04-25-07  . Diabetes mellitus 11-12-06   type 2  . Hypertension 11-04-06   Past Surgical History:  Procedure Laterality Date  . ABDOMINAL HYSTERECTOMY  12-11   Complete  . APPENDECTOMY  9-00  . BREAST SURGERY  4-00   of cyst  . FOOT TENDON SURGERY  05/16/2012   bone spur.    Marland Kitchen LAPAROSCOPY  4-08   for cyst and endometriosis  . left breast bx  12-11   Social History  Substance Use Topics  . Smoking status: Current Every Day Smoker    Packs/day: 0.50    Types: Cigarettes  . Smokeless tobacco: Never Used  . Alcohol use No   Family History  Problem Relation Age of Onset  . Other Mother        RA and CHF  . Diabetes Father   . Diabetes Other   . Heart disease Other     Current Outpatient Prescriptions  Medication Sig Dispense Refill  . AMBULATORY NON FORMULARY MEDICATION One Touch Ultra Test Strips.  Diagnosis: DM  Testing 2 times a day 100 each 5  . AMBULATORY NON FORMULARY MEDICATION 1 each by Other route 2 (two) times daily. Lancets for daily glucose checks Diagnosis: DM Testing 2 times a day 100 each 5  . AMBULATORY NON FORMULARY MEDICATION Medication Name: insulin pen needle 25G X 6MM for use for  injecting insulin. DX E11.8 100 each 12  . glucose blood (ONE TOUCH ULTRA TEST) test strip Test blood sugar once daily. Diagnosis Diabetes ICD-10 E11.8. 100 each prn  . guaiFENesin-codeine (ROBITUSSIN AC) 100-10 MG/5ML syrup Take 5-10 mLs by mouth 4 (four) times daily as needed for cough. 180 mL 0  . Insulin Glargine (LANTUS SOLOSTAR) 100 UNIT/ML Solostar Pen Inject 50 Units into the skin 2 (two) times daily. 10 pen 5  . ipratropium (ATROVENT) 0.06 % nasal spray Place 2 sprays into both nostrils 4 (four) times daily as needed for rhinitis. 15 mL 1  . KOMBIGLYZE XR 03-999 MG TB24 TAKE 1 TABLET BY MOUTH DAILY 90 tablet 0  . lisinopril (PRINIVIL,ZESTRIL) 20 MG tablet TAKE 1 TABLET BY MOUTH DAILY 90 tablet 0  . naproxen sodium (ANAPROX) 220 MG tablet Take 220 mg by mouth 2 (two) times daily with a meal. Patient used this medication for pain.    . NON FORMULARY One Touch Ultra View test strips- 2 bottles     . NON FORMULARY Glucometer, strips, and lancets to test daily DX:250.00     . NOVOFINE 32G X 6 MM MISC USE TO INJECT INSULIN UP TO THREE TIMES A DAY 100 each prn  . methylPREDNISolone (MEDROL DOSEPAK) 4 MG TBPK tablet 6-day pack as directed  to reduce inflammation - fill Rx if needed. Expires 09/15/17 (Patient not taking: Reported on 09/08/2017) 21 tablet 0   No current facility-administered medications for this visit.    Allergies  Allergen Reactions  . Trulicity [Dulaglutide] Nausea Only    Abdominal pain,  . Farxiga [Dapagliflozin]     foggy  . Invokana [Canagliflozin] Other (See Comments)    Back pain  . Jentadueto [Linagliptin-Metformin Hcl Er]     Abdominal pains and diarrhea  . Lipitor [Atorvastatin]     Sudden hair loss 2 weeks after starting lipitor.   Ameena Bernard [Liraglutide] Other (See Comments)    Abdominal pain and nausea.       Review of Systems: CONSTITUTIONAL: no fever/chills HEAD/EYES/EARS/NOSE/THROAT: no headache, no vision change or hearing change, yes sore throat,  yes ear fullness yes sinus pressure  CARDIAC: No chest pain/pressure/palpitations RESPIRATORY: yes cough, no shortness of breath GASTROINTESTINAL: no nausea, no vomiting, no abdominal pain/blood in stool/diarrhea/constipation MUSCULOSKELETAL: no myalgia/arthralgia   Exam:  BP 113/74   Pulse 72   Temp 98.2 F (36.8 C) (Oral)   Ht 5\' 1"  (1.549 m)   Wt 189 lb (85.7 kg)   BMI 35.71 kg/m  Constitutional: VSS, see above. General Appearance: alert, well-developed, well-nourished, NAD Eyes: Normal lids and conjunctive, non-icteric sclera, PERRLA Ears, Nose, Mouth, Throat: Normal external inspection ears/nares/mouth/lips/gums, normal TM, MMM; posterior pharynx without erythema, without exudate Neck: No masses, trachea midline. No thyroid enlargement/tenderness/mass appreciated, normal lymph nodes Respiratory: Normal respiratory effort. No  wheeze/rhonchi/rales Cardiovascular: S1/S2 normal, no murmur/rub/gallop auscultated. RRR. No carotid bruit or JVD. No lower extremity edema.   Results for orders placed or performed in visit on 09/06/17 (from the past 72 hour(s))  POCT rapid strep A     Status: None   Collection Time: 09/06/17 10:49 AM  Result Value Ref Range   Rapid Strep A Screen Negative Negative   Dg Chest 2 View  Result Date: 09/08/2017 CLINICAL DATA:  Shortness of breath. EXAM: CHEST  2 VIEW COMPARISON:  12/19/2015. FINDINGS: Trachea is midline. Heart size normal. Lungs are clear. No pleural fluid. Mild degenerative changes throughout the spine. IMPRESSION: No acute findings. Electronically Signed   By: Lorin Picket M.D.   On: 09/08/2017 09:43   CXR personally reviewed with the patient - no concerns.     ASSESSMENT/PLAN: Initially seen 2 days ago for uri presenting on day 2 of illness - didn't take steroids, still smoking, steroids caused her heart racing and some anxiety. Trial lower dose Prednisone and add inhaler - she did well in office with breathing treatment. ER/RTC  precautions reviewed.   Chest congestion - Plan: DG Chest 2 View, predniSONE (DELTASONE) 20 MG tablet, albuterol (PROVENTIL HFA;VENTOLIN HFA) 108 (90 Base) MCG/ACT inhaler, Spacer/Aero-Holding Chambers (POCKET SPACER) DEVI  Acute viral bronchiolitis - advised abx unlikely to be helpful, but printed in case continueing to worsen through the wekeend despite new therapies  Viral upper respiratory tract infection - Plan: ipratropium-albuterol (DUONEB) 0.5-2.5 (3) MG/3ML nebulizer solution 3 mL   Patient Instructions  Plan:  Lower dose of alternate steroids  Inhaler   Cough medicine - sent alternative, stop the stuff with codeine   Nasal spray - continue   Printed antibiotics - only fill these if your symptoms are still bothering you despite steroids and inhaler.   I'll call you if plan changes based on radiology official report of Xray      Return if symptoms worsen or fail to improve.

## 2017-09-19 ENCOUNTER — Emergency Department (HOSPITAL_BASED_OUTPATIENT_CLINIC_OR_DEPARTMENT_OTHER)
Admission: EM | Admit: 2017-09-19 | Discharge: 2017-09-19 | Disposition: A | Discharge status: Home / Self Care | Payer: BLUE CROSS/BLUE SHIELD | Attending: Emergency Medicine | Admitting: Emergency Medicine

## 2017-09-19 ENCOUNTER — Encounter (HOSPITAL_BASED_OUTPATIENT_CLINIC_OR_DEPARTMENT_OTHER): Payer: Self-pay | Admitting: *Deleted

## 2017-09-19 ENCOUNTER — Other Ambulatory Visit: Payer: Self-pay

## 2017-09-19 DIAGNOSIS — I10 Essential (primary) hypertension: Secondary | ICD-10-CM | POA: Insufficient documentation

## 2017-09-19 DIAGNOSIS — J45909 Unspecified asthma, uncomplicated: Secondary | ICD-10-CM | POA: Diagnosis not present

## 2017-09-19 DIAGNOSIS — E119 Type 2 diabetes mellitus without complications: Secondary | ICD-10-CM | POA: Diagnosis not present

## 2017-09-19 DIAGNOSIS — F17210 Nicotine dependence, cigarettes, uncomplicated: Secondary | ICD-10-CM | POA: Diagnosis not present

## 2017-09-19 DIAGNOSIS — R0789 Other chest pain: Secondary | ICD-10-CM | POA: Diagnosis not present

## 2017-09-19 DIAGNOSIS — R079 Chest pain, unspecified: Secondary | ICD-10-CM | POA: Diagnosis not present

## 2017-09-19 MED ORDER — HYDROCODONE-ACETAMINOPHEN 5-325 MG PO TABS: 1.0000 | ORAL_TABLET | Freq: Four times a day (QID) | ORAL | 0 refills | Status: DC | PRN

## 2017-09-19 NOTE — ED Triage Notes (Signed)
Here after 2 weeks of cough, also reports congestion, productive cough (yelow, thick sputum), bilateral side/rib pain. Have been unable to sleep for 2 weeks d/t sx. Pt previously seen and treated with albuterol inhaler, nasal spray, OTC cough suppressant, mucinex, prednisone and z-pack. Stopped prednisone after 2d d/t hyperglycemia and heart racing. Has had PNA vax and flu vax. Rates pain 9/10. (Denies: fever, NVD, dizziness, sob). C/c is rib pain and cough. Pt is a smoker. PCP in [place] (Dr. Suzi Roots).   Alert, NAD, calm, interactive, resps e/u, speaking in clear complete sentences, no dyspnea noted, skin W&D, VSS. Family at [facility].

## 2017-09-19 NOTE — ED Provider Notes (Addendum)
[place] DE[facility] Provider Note: Georgena Spurling, MD, FACEP  CSN: [id number] MRN: [id number] ARRIVAL: 09/19/17 at [place]: [id number]   CHIEF COMPLAINT  Chest Pain   HISTORY OF PRESENT ILLNESS  09/19/17 5:09 AM Terri Zimmerman is a 51 y.o. female with a 2-week history of bronchitis.  Specifically she had a cough productive of thick yellow sputum with shortness of breath.  She has been treated with an albuterol inhaler, nasal spray, Mucinex, prednisone and Zithromax.  She finished her Zithromax yesterday.  She did not complete her course of prednisone due to hyperglycemia and racing heart.  She denies shortness of breath or fever currently.  She is here because of the coughing has caused her to develop severe bilateral lower lateral rib pain.  The pain is worse with movement or deep breathing.  She rates it as a 9 out of 10.  She is able to tolerate the pain during the day when taking ibuprofen or Aleve but the pain is preventing her from sleeping at night.   Consultation with the [facility] state controlled substances database reveals the patient has received no opioid pain medication prescriptions in the past year.  She did receive one prescription for a codeine-based cough syrup.   Past Medical History:  Diagnosis Date  . Asthma 04-25-07  . Diabetes mellitus 11-12-06   type 2  . Hypertension 11-04-06    Past Surgical History:  Procedure Laterality Date  . ABDOMINAL HYSTERECTOMY  12-11   Complete  . APPENDECTOMY  9-00  . BREAST SURGERY  4-00   of cyst  . FOOT TENDON SURGERY  05/16/2012   bone spur.    Marland Kitchen LAPAROSCOPY  4-08   for cyst and endometriosis  . left breast bx  12-11    Family History  Problem Relation Age of Onset  . Other Mother        RA and CHF  . Diabetes Father   . Diabetes Other   . Heart disease Other     Social History   Tobacco Use  . Smoking status: Current Every Day Smoker    Packs/day: 0.50    Types: Cigarettes  . Smokeless tobacco:  Never Used  Substance Use Topics  . Alcohol use: No  . Drug use: No    Prior to Admission medications   Medication Sig Start Date End Date Taking? Authorizing Provider  albuterol (PROVENTIL HFA;VENTOLIN HFA) 108 (90 Base) MCG/ACT inhaler Inhale 1-2 puffs into the lungs every 6 (six) hours as needed for wheezing. 09/08/17   Emeterio Reeve, DO  AMBULATORY NON FORMULARY MEDICATION One Touch Ultra Test Strips.  Diagnosis: DM  Testing 2 times a day 11/01/12   Hali Marry, MD  AMBULATORY NON FORMULARY MEDICATION 1 each by Other route 2 (two) times daily. Lancets for daily glucose checks Diagnosis: DM Testing 2 times a day 03/09/17   Hali Marry, MD  AMBULATORY NON FORMULARY MEDICATION Medication Name: insulin pen needle 25G X 6MM for use for injecting insulin. DX E11.8 04/29/17   Hali Marry, MD  azithromycin (ZITHROMAX) 250 MG tablet 2 tabs po x1 on Day 1, then 1 tab po daily on Days 2 - 5 09/08/17   Emeterio Reeve, DO  glucose blood (ONE TOUCH ULTRA TEST) test strip Test blood sugar once daily. Diagnosis Diabetes ICD-10 E11.8. 06/01/16   Hali Marry, MD  guaiFENesin-codeine (ROBITUSSIN AC) 100-10 MG/5ML syrup Take 5-10 mLs by mouth 4 (four) times daily as needed for  cough. 09/06/17   Emeterio Reeve, DO  guaiFENesin-dextromethorphan (ROBITUSSIN DM) 100-10 MG/5ML syrup Take 5-10 mLs by mouth every 4 (four) hours as needed for cough. 09/08/17   Emeterio Reeve, DO  Insulin Glargine (LANTUS SOLOSTAR) 100 UNIT/ML Solostar Pen Inject 50 Units into the skin 2 (two) times daily. 05/24/17   Hali Marry, MD  ipratropium (ATROVENT) 0.06 % nasal spray Place 2 sprays into both nostrils 4 (four) times daily as needed for rhinitis. 09/06/17   Emeterio Reeve, DO  KOMBIGLYZE XR 03-999 MG TB24 TAKE 1 TABLET BY MOUTH DAILY 08/31/17   Hali Marry, MD  lisinopril (PRINIVIL,ZESTRIL) 20 MG tablet TAKE 1 TABLET BY MOUTH DAILY 08/31/17   Hali Marry, MD  naproxen sodium (ANAPROX) 220 MG tablet Take 220 mg by mouth 2 (two) times daily with a meal. Patient used this medication for pain.    [provider]  NON FORMULARY One Touch Ultra View test strips- 2 bottles     [provider]  NON FORMULARY Glucometer, strips, and lancets to test daily DX:250.00     [provider]  NOVOFINE 32G X 6 MM MISC USE TO INJECT INSULIN UP TO THREE TIMES A DAY 08/05/17   Hali Marry, MD  predniSONE (DELTASONE) 20 MG tablet Take 1 tablet (20 mg total) by mouth daily with breakfast. 09/08/17   Emeterio Reeve, DO  Spacer/Aero-Holding Chambers (POCKET SPACER) DEVI Use with inhaler as directed 09/08/17   Emeterio Reeve, DO    Allergies Trulicity [dulaglutide]; Farxiga [dapagliflozin]; Invokana [canagliflozin]; Jentadueto [linagliptin-metformin hcl er]; Lipitor [atorvastatin]; and Victoza [liraglutide]   REVIEW OF SYSTEMS  Negative except as noted here or in the History of Present Illness.   PHYSICAL EXAMINATION  Initial Vital Signs Blood pressure (!) 144/91, pulse 92, temperature 98.4 F (36.9 C), temperature source Oral, resp. rate 20, height 5\' 1"  (1.549 m), weight 86.2 kg (190 lb), SpO2 94 %.  Examination General: Well-developed, well-nourished female in no acute distress; appearance consistent with age of record HENT: normocephalic; atraumatic Eyes: pupils equal, round and reactive to light; extraocular muscles intact Neck: supple Heart: regular rate and rhythm Lungs: clear to auscultation bilaterally Chest: Bilateral lower rib tenderness without crepitus Abdomen: soft; nondistended; nontender; bowel sounds present Extremities: No deformity; full range of motion; pulses normal Neurologic: Awake, alert and oriented; motor function intact in all extremities and symmetric; no facial droop Skin: Warm and dry Psychiatric: Normal mood and affect   RESULTS  Summary of this visit's results,  reviewed by myself:   EKG Interpretation  Date/Time:    Ventricular Rate:    PR Interval:    QRS Duration:   QT Interval:    QTC Calculation:   R Axis:     Text Interpretation:        Laboratory Studies: No results found for this or any previous visit (from the past 24 hour(s)). Imaging Studies: No results found.  ED COURSE  Nursing notes and initial vitals signs, including pulse oximetry, reviewed.  Vitals:   09/19/17 0504 09/19/17 0505  BP: (!) 144/91   Pulse: 92   Resp: 20   Temp: 98.4 F (36.9 C)   TempSrc: Oral   SpO2: 94%   Weight:  86.2 kg (190 lb)  Height:  5\' 1"  (1.549 m)    PROCEDURES    ED DIAGNOSES     ICD-10-CM   1. Chest wall pain R07.89        Shanon Rosser, MD 09/19/17 [phone]  Shadrack Brummitt, Jenny Reichmann, MD 09/19/17 [phone]

## 2017-09-19 NOTE — ED Notes (Signed)
Dr. Florina Ou in to see, at [facility].

## 2017-10-02 ENCOUNTER — Emergency Department (INDEPENDENT_AMBULATORY_CARE_PROVIDER_SITE_OTHER): Payer: BLUE CROSS/BLUE SHIELD

## 2017-10-02 ENCOUNTER — Emergency Department (INDEPENDENT_AMBULATORY_CARE_PROVIDER_SITE_OTHER)
Admission: EM | Admit: 2017-10-02 | Discharge: 2017-10-02 | Disposition: A | Discharge status: Home / Self Care | Payer: BLUE CROSS/BLUE SHIELD | Source: Home / Self Care | Attending: Family Medicine | Admitting: Family Medicine

## 2017-10-02 ENCOUNTER — Encounter: Payer: Self-pay | Admitting: Emergency Medicine

## 2017-10-02 DIAGNOSIS — J42 Unspecified chronic bronchitis: Secondary | ICD-10-CM

## 2017-10-02 DIAGNOSIS — F17210 Nicotine dependence, cigarettes, uncomplicated: Secondary | ICD-10-CM

## 2017-10-02 DIAGNOSIS — R053 Chronic cough: Secondary | ICD-10-CM

## 2017-10-02 DIAGNOSIS — R0789 Other chest pain: Secondary | ICD-10-CM

## 2017-10-02 DIAGNOSIS — R05 Cough: Secondary | ICD-10-CM

## 2017-10-02 DIAGNOSIS — R079 Chest pain, unspecified: Secondary | ICD-10-CM

## 2017-10-02 MED ORDER — CETIRIZINE HCL 10 MG PO TABS: 10.0000 mg | ORAL_TABLET | Freq: Every day | ORAL | 0 refills | Status: DC

## 2017-10-02 MED ORDER — BENZONATATE 100 MG PO CAPS: 100.0000 mg | ORAL_CAPSULE | Freq: Three times a day (TID) | ORAL | 0 refills | Status: DC

## 2017-10-02 MED ORDER — CYCLOBENZAPRINE HCL 5 MG PO TABS: 5.0000 mg | ORAL_TABLET | Freq: Two times a day (BID) | ORAL | 0 refills | Status: DC | PRN

## 2017-10-02 NOTE — ED Provider Notes (Signed)
Terri Zimmerman CARE    CSN: [id number] Arrival date & time: 10/02/17  1014     History   Chief Complaint Chief Complaint  Patient presents with  . Cough  . Chest Pain    HPI Terri Zimmerman is a 51 y.o. female.   HPI Terri Zimmerman is a 51 y.o. female presenting to UC with c/o persistent mildly productive cough for about 1 month, associated persistent Left side rib pain that is worse at night and with the cough.  She has completed a course of Azithromycin and was also taking prednisone but had to stop due to hyperglycemia and tachycardia.  She was most recently seen at [facility] emergency department for the chest pain.  She was prescribed vicodin for the pain.  She planned to f/u with her PCP next week for her ongoing symptoms but last night she could not sleep due to the pain. She does use an albuterol inhaler on occasion as well as a nasal spray that was prescribed earlier on when symptoms initially started.  She has been taking ibuprofen and Aleve on occasion but states she has not taken for a few days due to it causing constipation.  Denies fever, chills, n/v/d.  No hx of blood clots. She has been smoking 1/2 ppd cigarettes since she was 51 yrs old but states she has only been able to smoke occasionally due to current chest pain and cough.  Per medical records, pt had a cardiac stress echo in June 2018 for chest pain, it showed a normal EF.  The stress test was ordered due to pt having chest pain. The pain at that time was determined to be from stress.    Past Medical History:  Diagnosis Date  . Asthma 04-25-07  . Diabetes mellitus 11-12-06   type 2  . Hypertension 11-04-06    Patient Active Problem List   Diagnosis Date Noted  . NAFL (nonalcoholic fatty liver) 40/98/1191  . De Quervain's tenosynovitis, left 02/20/2016  . Patchy loss of hair 03/02/2015  . Tobacco abuse 05/25/2014  . AMENORRHEA 04/17/2010  . PERIPHERAL NEUROPATHY 10/22/2009  . Essential hypertension 11/29/2008   . PALPITATIONS, OCCASIONAL 04/12/2008  . Diabetes mellitus type 2 with complications ([place]) 47/82/9562  . VITAMIN D DEFICIENCY 01/10/2008  . ALLERGIC RHINITIS 01/10/2008  . ASTHMA, UNSPECIFIED, UNSPECIFIED STATUS 01/10/2008    Past Surgical History:  Procedure Laterality Date  . ABDOMINAL HYSTERECTOMY  12-11   Complete  . APPENDECTOMY  9-00  . BREAST SURGERY  4-00   of cyst  . FOOT TENDON SURGERY  05/16/2012   bone spur.    Marland Kitchen LAPAROSCOPY  4-08   for cyst and endometriosis  . left breast bx  12-11    OB History    No data available       Home Medications    Prior to Admission medications   Medication Sig Start Date End Date Taking? Authorizing Provider  albuterol (PROVENTIL HFA;VENTOLIN HFA) 108 (90 Base) MCG/ACT inhaler Inhale 1-2 puffs into the lungs every 6 (six) hours as needed for wheezing. 09/08/17   Emeterio Reeve, DO  AMBULATORY NON FORMULARY MEDICATION One Touch Ultra Test Strips.  Diagnosis: DM  Testing 2 times a day 11/01/12   Hali Marry, MD  AMBULATORY NON FORMULARY MEDICATION 1 each by Other route 2 (two) times daily. Lancets for daily glucose checks Diagnosis: DM Testing 2 times a day 03/09/17   Hali Marry, MD  AMBULATORY NON FORMULARY MEDICATION Medication Name:  insulin pen needle 25G X 6MM for use for injecting insulin. DX E11.8 04/29/17   Hali Marry, MD  azithromycin (ZITHROMAX) 250 MG tablet 2 tabs po x1 on Day 1, then 1 tab po daily on Days 2 - 5 09/08/17   Emeterio Reeve, DO  benzonatate (TESSALON) 100 MG capsule Take 1-2 capsules (100-200 mg total) every 8 (eight) hours by mouth. 10/02/17   Noe Gens, PA-C  cetirizine (ZYRTEC) 10 MG tablet Take 1 tablet (10 mg total) daily by mouth. 10/02/17   Noe Gens, PA-C  cyclobenzaprine (FLEXERIL) 5 MG tablet Take 1-2 tablets (5-10 mg total) 2 (two) times daily as needed by mouth for muscle spasms. 10/02/17   Noe Gens, PA-C  glucose blood (ONE TOUCH ULTRA TEST) test  strip Test blood sugar once daily. Diagnosis Diabetes ICD-10 E11.8. 06/01/16   Hali Marry, MD  guaiFENesin-codeine (ROBITUSSIN AC) 100-10 MG/5ML syrup Take 5-10 mLs by mouth 4 (four) times daily as needed for cough. 09/06/17   Emeterio Reeve, DO  guaiFENesin-dextromethorphan (ROBITUSSIN DM) 100-10 MG/5ML syrup Take 5-10 mLs by mouth every 4 (four) hours as needed for cough. 09/08/17   Emeterio Reeve, DO  HYDROcodone-acetaminophen (NORCO) 5-325 MG tablet Take 1 tablet every 6 (six) hours as needed by mouth (for rib pain). 09/19/17   Molpus, John, MD  Insulin Glargine (LANTUS SOLOSTAR) 100 UNIT/ML Solostar Pen Inject 50 Units into the skin 2 (two) times daily. 05/24/17   Hali Marry, MD  ipratropium (ATROVENT) 0.06 % nasal spray Place 2 sprays into both nostrils 4 (four) times daily as needed for rhinitis. 09/06/17   Emeterio Reeve, DO  KOMBIGLYZE XR 03-999 MG TB24 TAKE 1 TABLET BY MOUTH DAILY 08/31/17   Hali Marry, MD  lisinopril (PRINIVIL,ZESTRIL) 20 MG tablet TAKE 1 TABLET BY MOUTH DAILY 08/31/17   Hali Marry, MD  naproxen sodium (ANAPROX) 220 MG tablet Take 220 mg by mouth 2 (two) times daily with a meal. Patient used this medication for pain.    [provider]  NON FORMULARY One Touch Ultra View test strips- 2 bottles     [provider]  NON FORMULARY Glucometer, strips, and lancets to test daily DX:250.00     [provider]  NOVOFINE 32G X 6 MM MISC USE TO INJECT INSULIN UP TO THREE TIMES A DAY 08/05/17   Hali Marry, MD  predniSONE (DELTASONE) 20 MG tablet Take 1 tablet (20 mg total) by mouth daily with breakfast. 09/08/17   Emeterio Reeve, DO  Spacer/Aero-Holding Chambers (POCKET SPACER) DEVI Use with inhaler as directed 09/08/17   Emeterio Reeve, DO    Family History Family History  Problem Relation Age of Onset  . Other Mother        RA and CHF  . Diabetes Father   . Diabetes Other   .  Heart disease Other     Social History Social History   Tobacco Use  . Smoking status: Current Every Day Smoker    Packs/day: 0.50    Types: Cigarettes  . Smokeless tobacco: Never Used  Substance Use Topics  . Alcohol use: No  . Drug use: No     Allergies   Trulicity [dulaglutide]; Farxiga [dapagliflozin]; Invokana [canagliflozin]; Jentadueto [linagliptin-metformin hcl er]; Lipitor [atorvastatin]; and Victoza [liraglutide]   Review of Systems Review of Systems  Constitutional: Negative for chills and fever.  HENT: Positive for congestion, ear pain (pressure) and postnasal drip. Negative for sore throat, trouble swallowing and  voice change.   Respiratory: Positive for cough and chest tightness. Negative for shortness of breath.   Cardiovascular: Positive for chest pain (Left side/ribs). Negative for palpitations.  Gastrointestinal: Negative for abdominal pain, diarrhea, nausea and vomiting.  Musculoskeletal: Negative for arthralgias, back pain and myalgias.  Skin: Negative for rash.     Physical Exam Triage Vital Signs ED Triage Vitals  Enc Vitals Group     BP 10/02/17 1102 128/89     Pulse Rate 10/02/17 1102 (!) 102     Resp 10/02/17 1102 18     Temp 10/02/17 1102 97.8 F (36.6 C)     Temp src --      SpO2 10/02/17 1102 97 %     Weight 10/02/17 1104 188 lb (85.3 kg)     Height 10/02/17 1104 5\' 1"  (1.549 m)     Head Circumference --      Peak Flow --      Pain Score 10/02/17 1105 4     Pain Loc --      Pain Edu? --      Excl. in [place]? --    No data found.  Updated Vital Signs BP 128/89 (BP Location: Right Arm)   Pulse (!) 102   Temp 97.8 F (36.6 C)   Resp 18   Ht 5\' 1"  (1.549 m)   Wt 188 lb (85.3 kg)   SpO2 97%   BMI 35.52 kg/m   Visual Acuity Right Eye Distance:   Left Eye Distance:   Bilateral Distance:    Right Eye Near:   Left Eye Near:    Bilateral Near:     Physical Exam  Constitutional: She is oriented to person, place, and time. She  appears well-developed and well-nourished.  Non-toxic appearance. She does not appear ill. No distress.  HENT:  Head: Normocephalic and atraumatic.  Right Ear: Tympanic membrane normal.  Left Ear: Tympanic membrane normal.  Nose: Nose normal.  Mouth/Throat: Uvula is midline, oropharynx is clear and moist and mucous membranes are normal.  Eyes: EOM are normal.  Neck: Normal range of motion.  Cardiovascular: Normal rate, regular rhythm and normal pulses. Exam reveals no friction rub.  No murmur heard. Slight tachycardia in triage, normal rate on exam.  Pulmonary/Chest: Effort normal and breath sounds normal. She has no decreased breath sounds. She has no wheezes. She has no rhonchi. She has no rales.  Musculoskeletal: Normal range of motion.  Neurological: She is alert and oriented to person, place, and time.  Skin: Skin is warm and dry.  Psychiatric: She has a normal mood and affect. Her behavior is normal.  Nursing note and vitals reviewed.    UC Treatments / Results  Labs (all labs ordered are listed, but only abnormal results are displayed) Labs Reviewed - No data to display  EKG  EKG Interpretation None       Radiology Dg Chest 2 View  Result Date: 10/02/2017 CLINICAL DATA:  Pt c/o a cough with left sided chest pain x 1 month. Hx of htn, dm, and asthma. EXAM: CHEST  2 VIEW COMPARISON:  Chest x-ray dated 09/08/2017. Chest x-ray dated 12/19/2015. FINDINGS: Heart size and mediastinal contours are normal. Chronic bronchitic changes again noted centrally. Additionally, the interstitial markings are coarse suggesting some degree of chronic interstitial lung disease. No confluent opacity to suggest a developing pneumonia. No pleural effusion or pneumothorax seen. Mild degenerative spurring noted within the kyphotic thoracic spine. No acute or suspicious osseous finding. IMPRESSION: 1.  No active cardiopulmonary disease. No evidence of pneumonia or pulmonary edema. 2. Stable chronic  bronchitic changes and/or chronic interstitial lung disease. Electronically Signed   By: Franki Cabot M.D.   On: 10/02/2017 11:29    Procedures Procedures (including critical care time)  Medications Ordered in UC Medications - No data to display   Initial Impression / Assessment and Plan / UC Course  I have reviewed the triage vital signs and the nursing notes.  Pertinent labs & imaging results that were available during my care of the patient were reviewed by me and considered in my medical decision making (see chart for details).     CXR: no acute findings, findings suggestive of chronic bronchitic changes Reviewed PMH. Doubt ACS, PE, or other emergent process taking place at this time.  Encouraged pt f/u with PCP to discuss additional treatment for COPD or possible referral to pulmonologist. Pt is also taking lisinopril for her HTN, encouraged to discuss changing BP medication as lisinopril could be contributing to chronic cough.  In meantime, will prescribe flexeril and avoid additional narcotic pain medication for chest wall pain. Cetirizine and tessalon for cough.  Encouraged smoking cessation, pt info packet for ways to quit smoking provided.   Discussed symptoms that warrant emergent care in the ED.   Final Clinical Impressions(s) / UC Diagnoses   Final diagnoses:  Persistent cough for 3 weeks or longer  Chronic bronchitis, unspecified chronic bronchitis type (HCC)  Left-sided chest wall pain  Cigarette smoker    ED Discharge Orders        Ordered    benzonatate (TESSALON) 100 MG capsule  Every 8 hours     10/02/17 1146    cetirizine (ZYRTEC) 10 MG tablet  Daily     10/02/17 1146    cyclobenzaprine (FLEXERIL) 5 MG tablet  2 times daily PRN     10/02/17 1146       Controlled Substance Prescriptions [place] Controlled Substance Registry consulted? Not Applicable   Tyrell Antonio 10/02/17 1219

## 2017-10-02 NOTE — ED Triage Notes (Signed)
Patient presents to [facility] with C/O pain in the left rib area and non productive cough times one month. Patient has been seen multiple times at hospital and PCP for this issue. She has also taken multiple OTC and Rx drugs to get rid of the issue with little to no relief.

## 2017-10-02 NOTE — Discharge Instructions (Signed)
°  Flexeril (cyclobenzaprine) is a muscle relaxer and may cause drowsiness. Do not drink alcohol, drive, or operate heavy machinery while taking.

## 2017-10-22 ENCOUNTER — Other Ambulatory Visit: Payer: Self-pay | Admitting: *Deleted

## 2017-10-22 ENCOUNTER — Telehealth: Payer: Self-pay | Admitting: Family Medicine

## 2017-10-22 DIAGNOSIS — E118 Type 2 diabetes mellitus with unspecified complications: Secondary | ICD-10-CM

## 2017-10-22 MED ORDER — INSULIN GLARGINE 100 UNIT/ML SOLOSTAR PEN: 60.0000 [IU] | PEN_INJECTOR | Freq: Two times a day (BID) | SUBCUTANEOUS | 5 refills | Status: DC

## 2017-10-22 NOTE — Telephone Encounter (Signed)
Called pharmacy and they just needed a new rx with change in dosage. Maryruth Eve, Lahoma Crocker

## 2017-10-22 NOTE — Telephone Encounter (Signed)
DM-  On ACE. A1c down to 7.9 today which is improvement from 8.2.increase to change to 60 units and then continue to increase every couple of days until blood sugars are consistently running under 130 fasting.  Called pt and she stated that her BS have been running under 130 for the most part however she had been sick for the past 5 weeks.  Will call the pharmacy for clarification.Maryruth Eve, Lahoma Crocker ]'

## 2017-10-22 NOTE — Telephone Encounter (Signed)
Very confused about all this.  Please call patient and we may need to call the pharmacy to verify what their actual question is.  Not sure what she actually needs.

## 2017-10-22 NOTE — Telephone Encounter (Signed)
Pt called. She states pharmacy do no have  60 units insulin pens/ twice a day.   They always  fill script but really do not want to.  She uses [facility] in [place].

## 2017-11-02 ENCOUNTER — Other Ambulatory Visit: Payer: Self-pay | Admitting: Family Medicine

## 2017-11-18 ENCOUNTER — Ambulatory Visit (INDEPENDENT_AMBULATORY_CARE_PROVIDER_SITE_OTHER): Payer: BLUE CROSS/BLUE SHIELD | Admitting: Family Medicine

## 2017-11-18 ENCOUNTER — Ambulatory Visit (HOSPITAL_BASED_OUTPATIENT_CLINIC_OR_DEPARTMENT_OTHER)
Admission: RE | Admit: 2017-11-18 | Discharge: 2017-11-18 | Disposition: A | Discharge status: Home / Self Care | Payer: BLUE CROSS/BLUE SHIELD | Source: Ambulatory Visit | Attending: Family Medicine | Admitting: Family Medicine

## 2017-11-18 ENCOUNTER — Other Ambulatory Visit: Payer: Self-pay | Admitting: Family Medicine

## 2017-11-18 ENCOUNTER — Encounter: Payer: Self-pay | Admitting: Family Medicine

## 2017-11-18 VITALS — BP 143/88 | HR 108 | Ht 61.0 in | Wt 193.0 lb

## 2017-11-18 DIAGNOSIS — M25562 Pain in left knee: Secondary | ICD-10-CM

## 2017-11-18 DIAGNOSIS — M7122 Synovial cyst of popliteal space [Baker], left knee: Secondary | ICD-10-CM | POA: Diagnosis not present

## 2017-11-18 DIAGNOSIS — M170 Bilateral primary osteoarthritis of knee: Secondary | ICD-10-CM | POA: Insufficient documentation

## 2017-11-18 DIAGNOSIS — M7989 Other specified soft tissue disorders: Secondary | ICD-10-CM | POA: Insufficient documentation

## 2017-11-18 MED ORDER — HYDROCODONE-ACETAMINOPHEN 5-325 MG PO TABS: 1.0000 | ORAL_TABLET | Freq: Four times a day (QID) | ORAL | 0 refills | Status: DC | PRN

## 2017-11-18 MED ORDER — DICLOFENAC SODIUM 1 % TD GEL: 4.0000 g | Freq: Four times a day (QID) | TRANSDERMAL | 11 refills | Status: DC

## 2017-11-18 NOTE — Progress Notes (Signed)
Terri Zimmerman is a 52 y.o. female who presents to [place] today for left knee pain and swelling.  Terri Zimmerman has a several day history of left knee pain and swelling occurring without injury.  She notes that she was doing some shoveling recently but denies any specific injury during the incident.  She notes pain in the anterior aspect of the knee.  The pain is worse with knee extension and climbing stairs.  She is limping and has significant pain with activity.  She is able to work from home.  She is tried some over-the-counter medicines for pain control which helps a little.  She denies any radiating pain weakness or numbness fevers or chills.  She is reluctant to have an injection today if possible.  She also notes left leg swelling in the calf associate with some tenderness.  She denies any personal history of DVT.  She notes the left calf swelling has occurred about the same time of the left knee pain.  No chest pain shortness of breath or cough.   Past Medical History:  Diagnosis Date  . Asthma 04-25-07  . Diabetes mellitus 11-12-06   type 2  . Hypertension 11-04-06   Past Surgical History:  Procedure Laterality Date  . ABDOMINAL HYSTERECTOMY  12-11   Complete  . APPENDECTOMY  9-00  . BREAST SURGERY  4-00   of cyst  . FOOT TENDON SURGERY  05/16/2012   bone spur.    Marland Kitchen LAPAROSCOPY  4-08   for cyst and endometriosis  . left breast bx  12-11   Social History   Tobacco Use  . Smoking status: Current Every Day Smoker    Packs/day: 0.50    Types: Cigarettes  . Smokeless tobacco: Never Used  Substance Use Topics  . Alcohol use: No     ROS:  As above   Medications: Current Outpatient Medications  Medication Sig Dispense Refill  . albuterol (PROVENTIL HFA;VENTOLIN HFA) 108 (90 Base) MCG/ACT inhaler Inhale 1-2 puffs into the lungs every 6 (six) hours as needed for wheezing. 1 Inhaler 2  . AMBULATORY NON FORMULARY MEDICATION One Touch  Ultra Test Strips.  Diagnosis: DM  Testing 2 times a day 100 each 5  . AMBULATORY NON FORMULARY MEDICATION 1 each by Other route 2 (two) times daily. Lancets for daily glucose checks Diagnosis: DM Testing 2 times a day 100 each 5  . AMBULATORY NON FORMULARY MEDICATION Medication Name: insulin pen needle 25G X 6MM for use for injecting insulin. DX E11.8 100 each 12  . cetirizine (ZYRTEC) 10 MG tablet Take 1 tablet (10 mg total) daily by mouth. 30 tablet 0  . cyclobenzaprine (FLEXERIL) 5 MG tablet Take 1-2 tablets (5-10 mg total) 2 (two) times daily as needed by mouth for muscle spasms. 20 tablet 0  . glucose blood (ONE TOUCH ULTRA TEST) test strip USE TO TEST BLOOD SUGAR DAILY 25 each 5  . Insulin Glargine (LANTUS SOLOSTAR) 100 UNIT/ML Solostar Pen Inject 60 Units into the skin 2 (two) times daily. 10 pen 5  . ipratropium (ATROVENT) 0.06 % nasal spray Place 2 sprays into both nostrils 4 (four) times daily as needed for rhinitis. 15 mL 1  . KOMBIGLYZE XR 03-999 MG TB24 TAKE 1 TABLET BY MOUTH DAILY 90 tablet 0  . lisinopril (PRINIVIL,ZESTRIL) 20 MG tablet TAKE 1 TABLET BY MOUTH DAILY 90 tablet 0  . naproxen sodium (ANAPROX) 220 MG tablet Take 220 mg by mouth 2 (two)  times daily with a meal. Patient used this medication for pain.    . NON FORMULARY One Touch Ultra View test strips- 2 bottles     . NON FORMULARY Glucometer, strips, and lancets to test daily DX:250.00     . NOVOFINE 32G X 6 MM MISC USE TO INJECT INSULIN UP TO THREE TIMES A DAY 100 each prn  . Spacer/Aero-Holding Chambers (POCKET SPACER) DEVI Use with inhaler as directed 1 each prn  . diclofenac sodium (VOLTAREN) 1 % GEL Apply 4 g topically 4 (four) times daily. To affected joint. 100 g 11  . HYDROcodone-acetaminophen (NORCO/VICODIN) 5-325 MG tablet Take 1 tablet by mouth every 6 (six) hours as needed. 15 tablet 0   No current facility-administered medications for this visit.    Allergies  Allergen Reactions  . Trulicity [Dulaglutide]  Nausea Only    Abdominal pain,  . Farxiga [Dapagliflozin]     foggy  . Invokana [Canagliflozin] Other (See Comments)    Back pain  . Jentadueto [Linagliptin-Metformin Hcl Er]     Abdominal pains and diarrhea  . Lipitor [Atorvastatin]     Sudden hair loss 2 weeks after starting lipitor.   [name] [Liraglutide] Other (See Comments)    Abdominal pain and nausea.      Exam:  BP (!) 143/88   Pulse (!) 108   Ht 5\' 1"  (1.549 m)   Wt 193 lb (87.5 kg)   BMI 36.47 kg/m  General: Well Developed, well nourished, and in no acute distress.  Neuro/Psych: Alert and oriented x3, extra-ocular muscles intact, able to move all 4 extremities, sensation grossly intact. Skin: Warm and dry, no rashes noted.  Respiratory: Not using accessory muscles, speaking in full sentences, trachea midline.  Cardiovascular: Pulses palpable, no extremity edema. Abdomen: Does not appear distended. MSK:  Left knee mild effusion otherwise normal-appearing. Range of motion 0-120 degrees with pain with extension. Tender to palpation across the anterior aspect the knee. Stable ligamentous exam. Intact flexion and extension strength however resisted knee extension is painful. Antalgic gait present.  Left calf: Mildly swollen compared to the right side.  No palpable cords.  Mildly tender to calf squeeze test.  X-ray left knee pending  No results found for this or any previous visit (from the past 48 hour(s)). US Venous Img Lower Unilateral Left  Result Date: 11/18/2017 CLINICAL DATA:  52 year old female with a history of left leg swelling EXAM: LEFT LOWER EXTREMITY VENOUS DOPPLER ULTRASOUND TECHNIQUE: Gray-scale sonography with graded compression, as well as color Doppler and duplex ultrasound were performed to evaluate the lower extremity deep venous systems from the level of the common femoral vein and including the common femoral, femoral, profunda femoral, popliteal and calf veins including the posterior tibial,  peroneal and gastrocnemius veins when visible. The superficial great saphenous vein was also interrogated. Spectral Doppler was utilized to evaluate flow at rest and with distal augmentation maneuvers in the common femoral, femoral and popliteal veins. COMPARISON:  None. FINDINGS: Contralateral Common Femoral Vein: Respiratory phasicity is normal and symmetric with the symptomatic side. No evidence of thrombus. Normal compressibility. Common Femoral Vein: No evidence of thrombus. Normal compressibility, respiratory phasicity and response to augmentation. Saphenofemoral Junction: No evidence of thrombus. Normal compressibility and flow on color Doppler imaging. Profunda Femoral Vein: No evidence of thrombus. Normal compressibility and flow on color Doppler imaging. Femoral Vein: No evidence of thrombus. Normal compressibility, respiratory phasicity and response to augmentation. Popliteal Vein: No evidence of thrombus. Normal compressibility, respiratory phasicity  and response to augmentation. Calf Veins: No evidence of thrombus. Normal compressibility and flow on color Doppler imaging. Superficial Great Saphenous Vein: No evidence of thrombus. Normal compressibility and flow on color Doppler imaging. Other Findings: Cystic fluid within the popliteal region of the left knee measures 4.9 cm x 1.2 cm x 3.7 cm. Lower extremity edema. IMPRESSION: Sonographic survey of left lower extremity negative for DVT Lower extremity soft tissue edema. Left Baker's cyst. Electronically Signed   By: Corrie Mckusick D.O.   On: 11/18/2017 11:43      Assessment and Plan: 52 y.o. 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  X-ray pending.  We discussed treatment options.  Plan for diclofenac gel Norco ice and relative rest.  If not better will proceed with injection.  Baker's cyst is present on ultrasound today which corresponds to this diagnosis.  Left calf swelling is not DVT is suspected.  She does have a Baker's  cyst which corresponds with knee swelling and effusion.  If not improving with conservative management targeted aspiration should be helpful.    Orders Placed This Encounter  Procedures  . DG Knee Complete 4 Views Left    Please include patellar sunrise, lateral, and weightbearing bilateral AP and bilateral rosenberg views    Standing Status:   Future    Number of Occurrences:   1    Standing Expiration Date:   01/18/2019    Order Specific Question:   Reason for exam:    Answer:   Please include patellar sunrise, lateral, and weightbearing bilateral AP and bilateral rosenberg views    Comments:   Please include patellar sunrise, lateral, and weightbearing bilateral AP and bilateral rosenberg views    Order Specific Question:   Preferred imaging location?    Answer:   [facility]  . US Venous Img Lower Unilateral Left    Standing Status:   Future    Number of Occurrences:   1    Standing Expiration Date:   01/17/2019    Order Specific Question:   Reason for Exam (SYMPTOM  OR DIAGNOSIS REQUIRED)    Answer:   eval left leg swelling    Order Specific Question:   Preferred imaging location?    Answer:   [facility]   Meds ordered this encounter  Medications  . HYDROcodone-acetaminophen (NORCO/VICODIN) 5-325 MG tablet    Sig: Take 1 tablet by mouth every 6 (six) hours as needed.    Dispense:  15 tablet    Refill:  0  . diclofenac sodium (VOLTAREN) 1 % GEL    Sig: Apply 4 g topically 4 (four) times daily. To affected joint.    Dispense:  100 g    Refill:  11    Discussed warning signs or symptoms. Please see discharge instructions. Patient expresses understanding.

## 2017-11-18 NOTE — Patient Instructions (Signed)
Thank you for coming in today. Go to [facility] for Ultrasound and Xray now.  Apply the voltaren Gel for pain.  If not better we can do an injection next week.

## 2017-11-23 ENCOUNTER — Encounter: Payer: Self-pay | Admitting: Family Medicine

## 2017-11-23 ENCOUNTER — Ambulatory Visit (INDEPENDENT_AMBULATORY_CARE_PROVIDER_SITE_OTHER): Payer: BLUE CROSS/BLUE SHIELD | Admitting: Family Medicine

## 2017-11-23 VITALS — BP 143/87 | HR 105 | Ht 61.0 in | Wt 191.0 lb

## 2017-11-23 DIAGNOSIS — K760 Fatty (change of) liver, not elsewhere classified: Secondary | ICD-10-CM

## 2017-11-23 DIAGNOSIS — R053 Chronic cough: Secondary | ICD-10-CM

## 2017-11-23 DIAGNOSIS — R05 Cough: Secondary | ICD-10-CM | POA: Diagnosis not present

## 2017-11-23 DIAGNOSIS — E118 Type 2 diabetes mellitus with unspecified complications: Secondary | ICD-10-CM

## 2017-11-23 DIAGNOSIS — I10 Essential (primary) hypertension: Secondary | ICD-10-CM | POA: Diagnosis not present

## 2017-11-23 LAB — COMPLETE METABOLIC PANEL WITH GFR
AG RATIO: 1.4 (calc) (ref 1.0–2.5)
ALBUMIN MSPROF: 4 g/dL (ref 3.6–5.1)
ALT: 24 U/L (ref 6–29)
AST: 21 U/L (ref 10–35)
Alkaline phosphatase (APISO): 110 U/L (ref 33–130)
BILIRUBIN TOTAL: 0.5 mg/dL (ref 0.2–1.2)
BUN: 7 mg/dL (ref 7–25)
CALCIUM: 9.4 mg/dL (ref 8.6–10.4)
CHLORIDE: 103 mmol/L (ref 98–110)
CO2: 28 mmol/L (ref 20–32)
Creat: 0.67 mg/dL (ref 0.50–1.05)
GFR, EST AFRICAN AMERICAN: 118 mL/min/{1.73_m2} (ref >=60)
GFR, EST NON AFRICAN AMERICAN: 102 mL/min/{1.73_m2} (ref >=60)
GLUCOSE: 238 mg/dL — AB (ref 65–99)
Globulin: 2.8 g/dL (calc) (ref 1.9–3.7)
Potassium: 4.4 mmol/L (ref 3.5–5.3)
Sodium: 140 mmol/L (ref 135–146)
Total Protein: 6.8 g/dL (ref 6.1–8.1)

## 2017-11-23 LAB — POCT GLYCOSYLATED HEMOGLOBIN (HGB A1C): Hemoglobin A1C: 9.1

## 2017-11-23 MED ORDER — LISINOPRIL 40 MG PO TABS: 40.0000 mg | ORAL_TABLET | Freq: Every day | ORAL | 0 refills | Status: DC

## 2017-11-23 NOTE — Progress Notes (Signed)
Subjective:    CC: DM, bp and liver ck  HPI: Diabetes - no hypoglycemic events. No wounds or sores that are not healing well. No increased thirst or urination. Checking glucose at home. Taking medications as prescribed without any side effects.  She has not been able to exercise because of her right knee.  She recently saw 1 of our sports med docs for right knee pain.  She feels like it is getting worse and is at the point where she is ready to potentially have an injection done.  Hypertension- Pt denies chest pain, SOB, dizziness, or heart palpitations.  Taking meds as directed w/o problems.  Denies medication side effects.    NAFL -liver enzymes have been elevated.  Her weight is actually down about 2 pounds which is great but still up from September by about 6 pounds.  She is also had a chronic cough and been seen by multiple providers over the last several months.  In the emergency department they had recommended that she consider switching off of lisinopril.  Past medical history, Surgical history, Family history not pertinant except as noted below, Social history, Allergies, and medications have been entered into the medical record, reviewed, and corrections made.   Review of Systems: No fevers, chills, night sweats, weight loss, chest pain, or shortness of breath.   Objective:    General: Well Developed, well nourished, and in no acute distress.  Neuro: Alert and oriented x3, extra-ocular muscles intact, sensation grossly intact.  HEENT: Normocephalic, atraumatic  Skin: Warm and dry, no rashes. Cardiac: Regular rate and rhythm, no murmurs rubs or gallops, no lower extremity edema.  Respiratory: Clear to auscultation bilaterally. Not using accessory muscles, speaking in full sentences.   Impression and Recommendations:    DM- uncontrolled. A1C up to 9.1.  Right now since she is not able to exercise because her knee just strongly encouraged her to really focus in on diet and portion  control etc.  HTN -uncontrolled.  We will increase lisinopril to 40 mg.  New prescription sent to pharmacy.  NAFL - due to recheck liver enzymes.  I encouraged her to focus in on healthy diet since she is unable to exercise right now I did encourage her again with our sports med.for her right knee pain as I do not want this to be a barrier to her being able to stay active.  Cough-consider switching off the lisinopril but right now there is several carbs that have been pulled from the market because of contamination with a carcinogen.  We discussed options including just switching her to a completely different kind of medication.  Right now she says she will just stick with lisinopril and then bring it up again when I see her back.

## 2017-11-24 ENCOUNTER — Other Ambulatory Visit: Payer: Self-pay | Admitting: Family Medicine

## 2017-12-13 ENCOUNTER — Telehealth: Payer: Self-pay | Admitting: Family Medicine

## 2017-12-13 NOTE — Telephone Encounter (Signed)
I will route to Dr. Madilyn Fireman but the fasting readings all seem to be improved except for 248. Seems like there could have been some diet choices that caused the jump. Goal for fasting glucose is 100-120.

## 2017-12-13 NOTE — Telephone Encounter (Signed)
Thank you  Ok to increase insuling to 70 units and really watch diet I agree that when there are big fluctuations in sugar number it is usually dietary.

## 2017-12-13 NOTE — Telephone Encounter (Signed)
Pt called clinic stating her recent blood sugar readings since increasing her insulin to 65 units are: 179, 164, 136, 248. All of these readings are early morning before food/drink.   Pt also questioned her lisinopril Rx. She states the pharmacy filled it as 40mg  tabs, she took one last night and reports it made her very dizzy. I advised that was incorrect. I called pharmacy and corrected the Rx. I have where we sent the correct Rx, but it was filled incorrectly. They are going to fill the 20mg  tabs as directed and Pt will pick them up today.  No further questions.

## 2017-12-13 NOTE — Telephone Encounter (Signed)
Pt advised,verbalized understanding. 

## 2017-12-20 ENCOUNTER — Telehealth: Payer: Self-pay | Admitting: Family Medicine

## 2017-12-20 NOTE — Telephone Encounter (Signed)
Received fax for PA on [place] sent through cover my meds and received authorization. I notified pharmacy. - CF  PA Case ID: [id number]

## 2017-12-27 ENCOUNTER — Emergency Department (HOSPITAL_BASED_OUTPATIENT_CLINIC_OR_DEPARTMENT_OTHER)
Admission: EM | Admit: 2017-12-27 | Discharge: 2017-12-27 | Disposition: A | Discharge status: Home / Self Care | Payer: BLUE CROSS/BLUE SHIELD | Attending: Emergency Medicine | Admitting: Emergency Medicine

## 2017-12-27 ENCOUNTER — Emergency Department (HOSPITAL_BASED_OUTPATIENT_CLINIC_OR_DEPARTMENT_OTHER): Payer: BLUE CROSS/BLUE SHIELD

## 2017-12-27 ENCOUNTER — Other Ambulatory Visit: Payer: Self-pay

## 2017-12-27 ENCOUNTER — Encounter (HOSPITAL_BASED_OUTPATIENT_CLINIC_OR_DEPARTMENT_OTHER): Payer: Self-pay | Admitting: Emergency Medicine

## 2017-12-27 DIAGNOSIS — E119 Type 2 diabetes mellitus without complications: Secondary | ICD-10-CM | POA: Diagnosis not present

## 2017-12-27 DIAGNOSIS — R110 Nausea: Secondary | ICD-10-CM | POA: Diagnosis not present

## 2017-12-27 DIAGNOSIS — R109 Unspecified abdominal pain: Secondary | ICD-10-CM | POA: Diagnosis not present

## 2017-12-27 DIAGNOSIS — R739 Hyperglycemia, unspecified: Secondary | ICD-10-CM | POA: Insufficient documentation

## 2017-12-27 DIAGNOSIS — K625 Hemorrhage of anus and rectum: Secondary | ICD-10-CM | POA: Insufficient documentation

## 2017-12-27 DIAGNOSIS — F17210 Nicotine dependence, cigarettes, uncomplicated: Secondary | ICD-10-CM | POA: Insufficient documentation

## 2017-12-27 DIAGNOSIS — J45909 Unspecified asthma, uncomplicated: Secondary | ICD-10-CM | POA: Diagnosis not present

## 2017-12-27 DIAGNOSIS — I10 Essential (primary) hypertension: Secondary | ICD-10-CM | POA: Diagnosis not present

## 2017-12-27 DIAGNOSIS — Z794 Long term (current) use of insulin: Secondary | ICD-10-CM | POA: Insufficient documentation

## 2017-12-27 DIAGNOSIS — K602 Anal fissure, unspecified: Secondary | ICD-10-CM | POA: Insufficient documentation

## 2017-12-27 DIAGNOSIS — R42 Dizziness and giddiness: Secondary | ICD-10-CM | POA: Diagnosis not present

## 2017-12-27 DIAGNOSIS — Z79899 Other long term (current) drug therapy: Secondary | ICD-10-CM | POA: Diagnosis not present

## 2017-12-27 LAB — CBC WITH DIFFERENTIAL/PLATELET
BASOS ABS: 0 10*3/uL (ref 0.0–0.1)
Basophils Relative: 0 %
EOS PCT: 1 %
Eosinophils Absolute: 0.1 10*3/uL (ref 0.0–0.7)
HEMATOCRIT: 39.9 % (ref 36.0–46.0)
Hemoglobin: 13.1 g/dL (ref 12.0–15.0)
LYMPHS ABS: 2.9 10*3/uL (ref 0.7–4.0)
Lymphocytes Relative: 29 %
MCH: 27.9 pg (ref 26.0–34.0)
MCHC: 32.8 g/dL (ref 30.0–36.0)
MCV: 85.1 fL (ref 78.0–100.0)
MONO ABS: 0.4 10*3/uL (ref 0.1–1.0)
MONOS PCT: 4 %
NEUTROS ABS: 6.4 10*3/uL (ref 1.7–7.7)
Neutrophils Relative %: 66 %
PLATELETS: 191 10*3/uL (ref 150–400)
RBC: 4.69 MIL/uL (ref 3.87–5.11)
RDW: 13.2 % (ref 11.5–15.5)
WBC: 9.7 10*3/uL (ref 4.0–10.5)

## 2017-12-27 LAB — CBG MONITORING, ED: Glucose-Capillary: 195 mg/dL — ABNORMAL HIGH (ref 65–99)

## 2017-12-27 LAB — BASIC METABOLIC PANEL
ANION GAP: 9 (ref 5–15)
BUN: 7 mg/dL (ref 6–20)
CALCIUM: 8.7 mg/dL — AB (ref 8.9–10.3)
CO2: 24 mmol/L (ref 22–32)
Chloride: 106 mmol/L (ref 101–111)
Creatinine, Ser: 0.47 mg/dL (ref 0.44–1.00)
GFR calc Af Amer: >60 mL/min (ref >60)
GFR calc non Af Amer: >60 mL/min (ref >60)
GLUCOSE: 199 mg/dL — AB (ref 65–99)
Potassium: 3.8 mmol/L (ref 3.5–5.1)
Sodium: 139 mmol/L (ref 135–145)

## 2017-12-27 MED ORDER — MECLIZINE HCL 25 MG PO TABS: 25.0000 mg | ORAL_TABLET | Freq: Three times a day (TID) | ORAL | 0 refills | Status: DC | PRN

## 2017-12-27 MED ORDER — ONDANSETRON 4 MG PO TBDP: 4.0000 mg | ORAL_TABLET | Freq: Three times a day (TID) | ORAL | 0 refills | Status: DC | PRN

## 2017-12-27 MED ORDER — DIPHENHYDRAMINE HCL 50 MG/ML IJ SOLN
25.0000 mg | Freq: Once | INTRAMUSCULAR | Status: AC
  Administered 2017-12-27: 25 mg via INTRAVENOUS
  Filled 2017-12-27: qty 1

## 2017-12-27 MED ORDER — ONDANSETRON HCL 4 MG/2ML IJ SOLN
4.0000 mg | Freq: Once | INTRAMUSCULAR | Status: AC
  Administered 2017-12-27: 4 mg via INTRAVENOUS
  Filled 2017-12-27: qty 2

## 2017-12-27 MED ORDER — IOPAMIDOL (ISOVUE-300) INJECTION 61%
100.0000 mL | Freq: Once | INTRAVENOUS | Status: AC | PRN
  Administered 2017-12-27: 100 mL via INTRAVENOUS

## 2017-12-27 MED ORDER — MECLIZINE HCL 25 MG PO TABS
25.0000 mg | ORAL_TABLET | Freq: Once | ORAL | Status: AC
  Administered 2017-12-27: 25 mg via ORAL
  Filled 2017-12-27: qty 1

## 2017-12-27 MED ORDER — SODIUM CHLORIDE 0.9 % IV BOLUS (SEPSIS)
500.0000 mL | Freq: Once | INTRAVENOUS | Status: AC
  Administered 2017-12-27: 500 mL via INTRAVENOUS

## 2017-12-27 MED FILL — ONDANSETRON ODT 4 MG TABLET: 4 | 4 days supply | Qty: 6 | Fill #0

## 2017-12-27 MED FILL — MECLIZINE 25 MG TABLET: 25 | 10 days supply | Qty: 30 | Fill #0

## 2017-12-27 NOTE — ED Provider Notes (Signed)
[facility] Provider Note   CSN: [id number] Arrival date & time: 12/27/17  6546     History   Chief Complaint Chief Complaint  Patient presents with  . Hyperglycemia    HPI Terri Zimmerman is a 52 y.o. female.  Chief complaint is abdominal pain, rectal bleeding when wiping, and high blood sugar  HPI 51 year old female.  History of insulin dependent diabetes.  Primary care physician has been adjusting her medications upward as she has been under poor control.  During the middle the night she had his episode of "severe" abdominal cramping.  She passed some gas and it improved.  She had a bowel movement this morning that was formed.  This was followed by noting that there was some bright red blood on the toilet paper.  Prior to getting out of bed this morning when she turned over she had an episode of severe spinning and nausea.  She tried to get to the bathroom and had a "Army crawl" as this recurred with movement.  It resolves and is present only with head movements now.  No history of known diverticuli.  Had a Colo-guard that was negative last year but is not had a colonoscopy.  Past Medical History:  Diagnosis Date  . Asthma 04-25-07  . Diabetes mellitus 11-12-06   type 2  . Hypertension 11-04-06    Patient Active Problem List   Diagnosis Date Noted  . NAFL (nonalcoholic fatty liver) 50/35/4656  . De Quervain's tenosynovitis, left 02/20/2016  . Patchy loss of hair 03/02/2015  . Tobacco abuse 05/25/2014  . AMENORRHEA 04/17/2010  . PERIPHERAL NEUROPATHY 10/22/2009  . Essential hypertension 11/29/2008  . PALPITATIONS, OCCASIONAL 04/12/2008  . Diabetes mellitus type 2 with complications ([place]) 81/27/5170  . VITAMIN D DEFICIENCY 01/10/2008  . ALLERGIC RHINITIS 01/10/2008  . ASTHMA, UNSPECIFIED, UNSPECIFIED STATUS 01/10/2008    Past Surgical History:  Procedure Laterality Date  . ABDOMINAL HYSTERECTOMY  12-11   Complete  . APPENDECTOMY  9-00  .  BREAST SURGERY  4-00   of cyst  . FOOT TENDON SURGERY  05/16/2012   bone spur.    Marland Kitchen LAPAROSCOPY  4-08   for cyst and endometriosis  . left breast bx  12-11    OB History    No data available       Home Medications    Prior to Admission medications   Medication Sig Start Date End Date Taking? Authorizing Provider  AMBULATORY NON FORMULARY MEDICATION One Touch Ultra Test Strips.  Diagnosis: DM  Testing 2 times a day 11/01/12   Hali Marry, MD  AMBULATORY NON FORMULARY MEDICATION 1 each by Other route 2 (two) times daily. Lancets for daily glucose checks Diagnosis: DM Testing 2 times a day 03/09/17   Hali Marry, MD  AMBULATORY NON FORMULARY MEDICATION Medication Name: insulin pen needle 25G X 6MM for use for injecting insulin. DX E11.8 04/29/17   Hali Marry, MD  diclofenac sodium (VOLTAREN) 1 % GEL Apply 4 g topically 4 (four) times daily. To affected joint. 11/18/17   Gregor Hams, MD  glucose blood (ONE TOUCH ULTRA TEST) test strip USE TO TEST BLOOD SUGAR DAILY 11/03/17   Hali Marry, MD  HYDROcodone-acetaminophen (NORCO/VICODIN) 5-325 MG tablet Take 1 tablet by mouth every 6 (six) hours as needed. 11/18/17   Gregor Hams, MD  Insulin Glargine (LANTUS SOLOSTAR) 100 UNIT/ML Solostar Pen Inject 60 Units into the skin 2 (two) times daily. 10/22/17  Hali Marry, MD  KOMBIGLYZE XR 03-999 MG TB24 TAKE 1 TABLET BY MOUTH DAILY 11/24/17   Hali Marry, MD  lisinopril (PRINIVIL,ZESTRIL) 20 MG tablet TAKE 1 TABLET BY MOUTH DAILY 11/24/17   Hali Marry, MD  meclizine (ANTIVERT) 25 MG tablet Take 1 tablet (25 mg total) by mouth 3 (three) times daily as needed for dizziness. 12/27/17   Tanna Furry, MD  naproxen sodium (ANAPROX) 220 MG tablet Take 220 mg by mouth 2 (two) times daily with a meal. Patient used this medication for pain.    [provider]  NON FORMULARY One Touch Ultra View test strips- 2 bottles     [provider]  NON FORMULARY Glucometer, strips, and lancets to test daily DX:250.00     [provider]  NOVOFINE 32G X 6 MM MISC USE TO INJECT INSULIN UP TO THREE TIMES A DAY 08/05/17   Hali Marry, MD  ondansetron (ZOFRAN ODT) 4 MG disintegrating tablet Take 1 tablet (4 mg total) by mouth every 8 (eight) hours as needed for nausea. 12/27/17   Tanna Furry, MD    Family History Family History  Problem Relation Age of Onset  . Other Mother        RA and CHF  . Diabetes Father   . Diabetes Other   . Heart disease Other     Social History Social History   Tobacco Use  . Smoking status: Current Every Day Smoker    Packs/day: 0.50    Types: Cigarettes  . Smokeless tobacco: Never Used  Substance Use Topics  . Alcohol use: No  . Drug use: No     Allergies   Trulicity [dulaglutide]; Farxiga [dapagliflozin]; Invokana [canagliflozin]; Jentadueto [linagliptin-metformin hcl er]; Lipitor [atorvastatin]; and Victoza [liraglutide]   Review of Systems Review of Systems  Constitutional: Negative for appetite change, chills, diaphoresis, fatigue and fever.  HENT: Negative for mouth sores, sore throat and trouble swallowing.   Eyes: Negative for visual disturbance.  Respiratory: Negative for cough, chest tightness, shortness of breath and wheezing.   Cardiovascular: Negative for chest pain.  Gastrointestinal: Positive for anal bleeding, blood in stool and nausea. Negative for abdominal distention, abdominal pain, diarrhea and vomiting.  Endocrine: Negative for polydipsia, polyphagia and polyuria.  Genitourinary: Negative for dysuria, frequency and hematuria.  Musculoskeletal: Negative for gait problem.  Skin: Negative for color change, pallor and rash.  Neurological: Positive for dizziness. Negative for syncope, light-headedness and headaches.  Hematological: Does not bruise/bleed easily.  Psychiatric/Behavioral: Negative for behavioral problems and confusion.      Physical Exam Updated Vital Signs BP 109/77 (BP Location: Right Arm)   Pulse 87   Temp 97.8 F (36.6 C) (Oral)   Resp 16   SpO2 98%   Physical Exam  General awake and alert EENT right middle ear effusion.  Not erythematous Neck no adenopathy or JVD Lungs clear Heart regular And soft.  No focal tenderness.  Normal bowel sounds. GU no suprapubic tenderness Muscular skeletal normal Neuro cranial nerves intact.  Recreation of dizziness but no diagnosed on exam.  Moving all 4 without deficits  Skin negative Psych calm  ED Treatments / Results  Labs (all labs ordered are listed, but only abnormal results are displayed) Labs Reviewed  BASIC METABOLIC PANEL - Abnormal; Notable for the following components:      Result Value   Glucose, Bld 199 (*)    Calcium 8.7 (*)    All other components within normal limits  CBG MONITORING, ED - Abnormal; Notable for the following components:   Glucose-Capillary 195 (*)    All other components within normal limits  CBC WITH DIFFERENTIAL/PLATELET    EKG  EKG Interpretation None       Radiology Ct Abdomen Pelvis W Contrast  Result Date: 12/27/2017 CLINICAL DATA:  Left lower quadrant pain/tenderness, evaluate for diverticulitis. Prior hysterectomy and appendectomy. EXAM: CT ABDOMEN AND PELVIS WITH CONTRAST TECHNIQUE: Multidetector CT imaging of the abdomen and pelvis was performed using the standard protocol following bolus administration of intravenous contrast. CONTRAST:  123mL ISOVUE-300 IOPAMIDOL (ISOVUE-300) INJECTION 61% COMPARISON:  06/14/2016 FINDINGS: Lower chest: Lung bases are clear. Hepatobiliary: Scattered subcentimeter hepatic cysts. Gallbladder is unremarkable. No intrahepatic or extrahepatic ductal dilatation. Pancreas: Within normal limits. Spleen: Within normal limits. Adrenals/Urinary Tract: 3.0 cm right adrenal adenoma, grossly unchanged. Left adrenal gland is within normal limits. Kidneys are within normal limits.   No hydronephrosis. Bladder is within normal limits. Stomach/Bowel: Stomach is within normal limits. No evidence of bowel obstruction. Prior appendectomy. No colonic wall thickening or inflammatory changes. Vascular/Lymphatic: No evidence of abdominal aortic aneurysm. No suspicious abdominopelvic lymphadenopathy. 1.3 cm short axis portacaval node, reactive. Reproductive: Status post hysterectomy. No adnexal masses. Other: No abdominopelvic ascites. Musculoskeletal: Mild degenerative changes of the visualized thoracolumbar spine, most prominent at L5-S1. IMPRESSION: No CT findings to account for the patient's left lower quadrant abdominal pain. Specifically, no evidence of diverticulitis. No evidence of bowel obstruction.  Prior appendectomy. 3.0 cm right adrenal adenoma, benign. Electronically Signed   By: Julian Hy M.D.   On: 12/27/2017 08:32    Procedures Procedures (including critical care time)  Medications Ordered in ED Medications  sodium chloride 0.9 % bolus 500 mL (500 mLs Intravenous New Bag/Given 12/27/17 0745)  ondansetron (ZOFRAN) injection 4 mg (4 mg Intravenous Given 12/27/17 0746)  diphenhydrAMINE (BENADRYL) injection 25 mg (25 mg Intravenous Given 12/27/17 0746)  meclizine (ANTIVERT) tablet 25 mg (25 mg Oral Given 12/27/17 0746)  iopamidol (ISOVUE-300) 61 % injection 100 mL (100 mLs Intravenous Contrast Given 12/27/17 0816)     Initial Impression / Assessment and Plan / ED Course  I have reviewed the triage vital signs and the nursing notes.  Pertinent labs & imaging results that were available during my care of the patient were reviewed by me and considered in my medical decision making (see chart for details).   Rectal exam shows probable small fissure.  Will plan CT scan rule out colitis, diverticulitis.  Likely peripheral vertigo.  Plan fluids, Zofran, Benadryl, Antivert.  Reevaluation  Final Clinical Impressions(s) / ED Diagnoses   Final diagnoses:  Hyperglycemia   Vertigo  Rectal bleeding  Anal fissure   Patient with improvement.  CT abdomen normal.  I discussed with her that the anal fissure should cause bleeding only with bowel movement or wiping.  Should not cause intermittent or large volume bleeding.  Vertigo precautions.  No driving until 24 hours without symptoms, take meclizine until 24 hours without symptoms.   ED Discharge Orders        Ordered    meclizine (ANTIVERT) 25 MG tablet  3 times daily PRN     12/27/17 0848    ondansetron (ZOFRAN ODT) 4 MG disintegrating tablet  Every 8 hours PRN     12/27/17 0848       Tanna Furry, MD 12/27/17 [phone]

## 2017-12-27 NOTE — ED Triage Notes (Addendum)
Pt presents with c/o elevated blood sugar at home this morning. Pt reports her sugar at home was 248 which is high for her.Pt reports dizziness and nausea and rectal bleeding. Pt took 70 units of lantus this morning. Pt states she feels like she woke up in a "votex" this morning that her bed was sucking her in.

## 2017-12-27 NOTE — Discharge Instructions (Signed)
Take meclizine until 24 hours without dizziness.  No driving until 24 hours without dizziness.  Meclizine can cause drowsiness.  You have an anal fissure on your exam.  This can have bleeding only with having a bowel movement or wiping.  This should not cause large volume bleeding.  We will up with your primary care physician if your bleeding continues more than 48 hours.  Turn to the emergency room if your bleeding becomes heavy, he will become lightheaded or dizzy, or any other worsening symptoms.

## 2018-01-03 ENCOUNTER — Encounter: Payer: Self-pay | Admitting: Family Medicine

## 2018-01-03 ENCOUNTER — Ambulatory Visit (INDEPENDENT_AMBULATORY_CARE_PROVIDER_SITE_OTHER): Payer: BLUE CROSS/BLUE SHIELD | Admitting: Family Medicine

## 2018-01-03 VITALS — BP 136/86 | HR 100 | Ht 61.0 in | Wt 190.0 lb

## 2018-01-03 DIAGNOSIS — M25561 Pain in right knee: Secondary | ICD-10-CM

## 2018-01-03 DIAGNOSIS — H8111 Benign paroxysmal vertigo, right ear: Secondary | ICD-10-CM

## 2018-01-03 NOTE — Patient Instructions (Signed)
Thank you for coming in today. Try the Epley Maneuver.   Call or go to the ER if you develop a large red swollen joint with extreme pain or oozing puss.   Attend PT   Recheck with me in 6 weeks.  Return sooner if needed.    Benign Positional Vertigo Vertigo is the feeling that you or your surroundings are moving when they are not. Benign positional vertigo is the most common form of vertigo. The cause of this condition is not serious (is benign). This condition is triggered by certain movements and positions (is positional). This condition can be dangerous if it occurs while you are doing something that could endanger you or others, such as driving. What are the causes? In many cases, the cause of this condition is not known. It may be caused by a disturbance in an area of the inner ear that helps your brain to sense movement and balance. This disturbance can be caused by a viral infection (labyrinthitis), head injury, or repetitive motion. What increases the risk? This condition is more likely to develop in:  Women.  People who are 70 years of age or older.  What are the signs or symptoms? Symptoms of this condition usually happen when you move your head or your eyes in different directions. Symptoms may start suddenly, and they usually last for less than a minute. Symptoms may include:  Loss of balance and falling.  Feeling like you are spinning or moving.  Feeling like your surroundings are spinning or moving.  Nausea and vomiting.  Blurred vision.  Dizziness.  Involuntary eye movement (nystagmus).  Symptoms can be mild and cause only slight annoyance, or they can be severe and interfere with daily life. Episodes of benign positional vertigo may return (recur) over time, and they may be triggered by certain movements. Symptoms may improve over time. How is this diagnosed? This condition is usually diagnosed by medical history and a physical exam of the head, neck, and  ears. You may be referred to a health care provider who specializes in ear, nose, and throat (ENT) problems (otolaryngologist) or a provider who specializes in disorders of the nervous system (neurologist). You may have additional testing, including:  MRI.  A CT scan.  Eye movement tests. Your health care provider may ask you to change positions quickly while he or she watches you for symptoms of benign positional vertigo, such as nystagmus. Eye movement may be tested with an electronystagmogram (ENG), caloric stimulation, the Dix-Hallpike test, or the roll test.  An electroencephalogram (EEG). This records electrical activity in your brain.  Hearing tests.  How is this treated? Usually, your health care provider will treat this by moving your head in specific positions to adjust your inner ear back to normal. Surgery may be needed in severe cases, but this is rare. In some cases, benign positional vertigo may resolve on its own in 2-4 weeks. Follow these instructions at home: Safety  Move slowly.Avoid sudden body or head movements.  Avoid driving.  Avoid operating heavy machinery.  Avoid doing any tasks that would be dangerous to you or others if a vertigo episode would occur.  If you have trouble walking or keeping your balance, try using a cane for stability. If you feel dizzy or unstable, sit down right away.  Return to your normal activities as told by your health care provider. Ask your health care provider what activities are safe for you. General instructions  Take over-the-counter and prescription medicines  only as told by your health care provider.  Avoid certain positions or movements as told by your health care provider.  Drink enough fluid to keep your urine clear or pale yellow.  Keep all follow-up visits as told by your health care provider. This is important. Contact a health care provider if:  You have a fever.  Your condition gets worse or you develop new  symptoms.  Your family or friends notice any behavioral changes.  Your nausea or vomiting gets worse.  You have numbness or a "pins and needles" sensation. Get help right away if:  You have difficulty speaking or moving.  You are always dizzy.  You faint.  You develop severe headaches.  You have weakness in your legs or arms.  You have changes in your hearing or vision.  You develop a stiff neck.  You develop sensitivity to light. This information is not intended to replace advice given to you by your health care provider. Make sure you discuss any questions you have with your health care provider. Document Released: 08/10/2006 Document Revised: 04/09/2016 Document Reviewed: 02/25/2015 Elsevier Interactive Patient Education  [employer].

## 2018-01-03 NOTE — Progress Notes (Signed)
Terri Zimmerman is a 52 y.o. female who presents to [facility] today for knee pain and vertigo.  Patient with a several month history of left knee pain. Patient seen in January. Since that time, her posterior knee pain due to baker's cyst has resolved. Still have anteromedial knee pain. Walking and prolonged sitting makes it worse. Hurts most of the day. Nothing seems to make it better. Continues to use ibuprofen for pain with slight relief. Patient wondering if injection would be helpful. Does note intermittent swelling, but swelling down today. Has noticed no warmth or signs of infection.  Vertigo: patient with 8 day history of new onset vertigo. Patient went to ED after first episode last Monday which lasted several minutes. Feels like the room is spinning with some associated nausea and dizziness. Denies vomiting. Patient says lying down causes the sensation every time. Lying on right side makes it particularly bad. Antivert has not helped. Does feel like vertigo episodes are less severe, now lasting 2-3 seconds.    Past Medical History:  Diagnosis Date  . Asthma 04-25-07  . Diabetes mellitus 11-12-06   type 2  . Hypertension 11-04-06   Past Surgical History:  Procedure Laterality Date  . ABDOMINAL HYSTERECTOMY  12-11   Complete  . APPENDECTOMY  9-00  . BREAST SURGERY  4-00   of cyst  . FOOT TENDON SURGERY  05/16/2012   bone spur.    Marland Kitchen LAPAROSCOPY  4-08   for cyst and endometriosis  . left breast bx  12-11   Social History   Tobacco Use  . Smoking status: Current Every Day Smoker    Packs/day: 0.50    Types: Cigarettes  . Smokeless tobacco: Never Used  Substance Use Topics  . Alcohol use: No   family history includes Diabetes in her father and other; Heart disease in her other; Other in her mother.  ROS as above:  Medications: Current Outpatient  Medications  Medication Sig Dispense Refill  . AMBULATORY NON FORMULARY MEDICATION One Touch Ultra Test Strips.  Diagnosis: DM  Testing 2 times a day 100 each 5  . AMBULATORY NON FORMULARY MEDICATION 1 each by Other route 2 (two) times daily. Lancets for daily glucose checks Diagnosis: DM Testing 2 times a day 100 each 5  . AMBULATORY NON FORMULARY MEDICATION Medication Name: insulin pen needle 25G X 6MM for use for injecting insulin. DX E11.8 100 each 12  . diclofenac sodium (VOLTAREN) 1 % GEL Apply 4 g topically 4 (four) times daily. To affected joint. 100 g 11  . glucose blood (ONE TOUCH ULTRA TEST) test strip USE TO TEST BLOOD SUGAR DAILY 25 each 5  . HYDROcodone-acetaminophen (NORCO/VICODIN) 5-325 MG tablet Take 1 tablet by mouth every 6 (six) hours as needed. 15 tablet 0  . Insulin Glargine (LANTUS SOLOSTAR) 100 UNIT/ML Solostar Pen Inject 60 Units into the skin 2 (two) times daily. (Patient taking differently: Inject 70 Units into the skin 2 (two) times daily. ) 10 pen 5  . KOMBIGLYZE XR 03-999 MG TB24 TAKE 1 TABLET BY MOUTH DAILY 90 tablet 3  . lisinopril (PRINIVIL,ZESTRIL) 20 MG tablet TAKE 1 TABLET BY MOUTH DAILY 90 tablet 3  . meclizine (ANTIVERT) 25 MG tablet Take 1 tablet (25 mg total) by mouth 3 (three) times daily as needed for dizziness. 30 tablet 0  . naproxen sodium (ANAPROX) 220 MG tablet Take 220 mg by mouth 2 (two) times  daily with a meal. Patient used this medication for pain.    . NON FORMULARY One Touch Ultra View test strips- 2 bottles     . NON FORMULARY Glucometer, strips, and lancets to test daily DX:250.00     . NOVOFINE 32G X 6 MM MISC USE TO INJECT INSULIN UP TO THREE TIMES A DAY 100 each prn  . ondansetron (ZOFRAN ODT) 4 MG disintegrating tablet Take 1 tablet (4 mg total) by mouth every 8 (eight) hours as needed for nausea. 6 tablet 0   No current facility-administered medications for this visit.    Allergies  Allergen Reactions  . Trulicity [Dulaglutide] Nausea  Only    Abdominal pain,  . Farxiga [Dapagliflozin]     foggy  . Invokana [Canagliflozin] Other (See Comments)    Back pain  . Jentadueto [Linagliptin-Metformin Hcl Er]     Abdominal pains and diarrhea  . Lipitor [Atorvastatin]     Sudden hair loss 2 weeks after starting lipitor.   Berneda Bernard [Liraglutide] Other (See Comments)    Abdominal pain and nausea.     Health Maintenance Health Maintenance  Topic Date Due  . OPHTHALMOLOGY EXAM  04/30/2018  . HEMOGLOBIN A1C  05/23/2018  . FOOT EXAM  11/23/2018  . Fecal DNA (Cologuard)  02/15/2019  . MAMMOGRAM  06/26/2019  . TETANUS/TDAP  11/30/2026  . PNEUMOCOCCAL POLYSACCHARIDE VACCINE  Completed  . INFLUENZA VACCINE  Completed  . HIV Screening  Completed     Exam:  BP 136/86   Pulse 100   Ht 5\' 1"  (1.549 m)   Wt 190 lb (86.2 kg)   BMI 35.90 kg/m  Gen: Well NAD HEENT: EOMI,  MMM. TMs clear bilaterally. No rash on external auditory canal.  Lungs: Normal work of breathing. CTABL. No crackles or wheezes.  Heart: RRR no MRG normal TM BL Abd: NABS, Soft. Nondistended, Nontender Exts: Brisk capillary refill, warm and well perfused.  Knee: Normal in appearance. Minimal effusion. No erythema. ROM 0-120 degrees. Strength 5/5 on flexion/extension of knee without pain. Ligamentous exam intact. Negative McMurray's Neuro: Alert and oriented. Normal gait.   Procedure: Real-time Ultrasound Guided Injection of left knee  Device: GE Logiq E  Images permanently stored and available for review in the ultrasound unit. Verbal informed consent obtained. Discussed risks and benefits of procedure. Warned about infection bleeding damage to structures skin hypopigmentation and fat atrophy among others. Patient expresses understanding and agreement Time-out conducted.  Noted no overlying erythema, induration, or other signs of local infection.  Skin prepped in a sterile fashion.  Local anesthesia: Topical Ethyl chloride.  With sterile  technique and under real time ultrasound guidance: 40mg  kenalog and 86ml marcaine injected easily.  Completed without difficulty  Pain immediately resolved suggesting accurate placement of the medication.  Advised to call if fevers/chills, erythema, induration, drainage, or persistent bleeding.  Images permanently stored and available for review in the ultrasound unit.  Impression: Technically successful ultrasound guided injection.     No results found for this or any previous visit (from the past 72 hour(s)). No results found.   Assessment and Plan: 52 y.o. female with knee pain and vertigo.  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 Xray of knee shows moderate DJD changes. Patient amenable to steroid injection today. Steroid injection performed without complications. She may return to follow up if not improving.   Vertigo: consistent with BPPV. Antivert not helping. Does seem to be improving clinically over last week, but still  symptomatic. Referred to vestibular physical therapy. Recommended attempting Epley maneuver at home. Expect she will improve with PT.   Orders Placed This Encounter  Procedures  . Ambulatory referral to Physical Therapy    Referral Priority:   Routine    Referral Type:   Physical Medicine    Referral Reason:   Specialty Services Required    Requested Specialty:   Physical Therapy   No orders of the defined types were placed in this encounter.    Discussed warning signs or symptoms. Please see discharge instructions. Patient expresses understanding.

## 2018-02-09 ENCOUNTER — Other Ambulatory Visit: Payer: Self-pay | Admitting: Family Medicine

## 2018-02-09 ENCOUNTER — Ambulatory Visit (INDEPENDENT_AMBULATORY_CARE_PROVIDER_SITE_OTHER): Payer: BLUE CROSS/BLUE SHIELD | Admitting: Family Medicine

## 2018-02-09 ENCOUNTER — Encounter: Payer: Self-pay | Admitting: Family Medicine

## 2018-02-09 VITALS — BP 134/83 | HR 106 | Ht 61.0 in | Wt 192.0 lb

## 2018-02-09 DIAGNOSIS — R0683 Snoring: Secondary | ICD-10-CM

## 2018-02-09 DIAGNOSIS — H8111 Benign paroxysmal vertigo, right ear: Secondary | ICD-10-CM

## 2018-02-09 DIAGNOSIS — J018 Other acute sinusitis: Secondary | ICD-10-CM | POA: Diagnosis not present

## 2018-02-09 DIAGNOSIS — M542 Cervicalgia: Secondary | ICD-10-CM

## 2018-02-09 DIAGNOSIS — G4489 Other headache syndrome: Secondary | ICD-10-CM | POA: Diagnosis not present

## 2018-02-09 DIAGNOSIS — I10 Essential (primary) hypertension: Secondary | ICD-10-CM

## 2018-02-09 MED ORDER — AZITHROMYCIN 250 MG PO TABS: ORAL_TABLET | ORAL | 0 refills | Status: AC

## 2018-02-09 NOTE — Progress Notes (Signed)
Subjective:    Patient ID: Terri Zimmerman, female    DOB: 1966/04/15, 52 y.o.   MRN: [id number]  HPI   Patient comes in today complaining of not feeling well for about 6 weeks.  6 weeks ago she was diagnosed with benign positional vertigo and ever since then she is been having "pockets" of pain in her head.  What she means by that is that she will have pain on the left side and then on the right side and then in the left temple and behind her right ear itches seems to move around.  Earlier this week she had an episode behind and above her right ear where it actually felt like it was intensely burning.  She said it lasted an hour.  She had not had that happen before.  And today she is having severe pressure in the right side of her face over the facial cheek and into the right ear and down to the right neck.  That has been persistently painful and sore but feels particularly bad today.  She is still experiencing some vertigo on and off particularly if she lays on her left side but is not nearly as intense as it was 6 weeks ago.  It did seem to get significantly better when she was doing the home exercises but has recently not been doing those as consistently.  She denies any significant visual changes but has been more tired than usual and can fall asleep easily during the daytime.  She does report that she snores nightly and in fact some nights it so severe that her husband sleeps on the couch.  She is been taking NyQuil at night to try to help with the sinus pressure and pain.  No significant nasal discharge.  No eye symptoms.  No fevers chills sweats nausea or vomiting.    She has been taking nyquail.  Does help her rest at night.  Still has a mild productive cough from November.  She also complains of a lot of neck pain.  She feels like it is related to her ergonomics of her workstation and typing a lot at work.    Review of Systems   Comprehensive of review of systems is negative except for  HPI.  BP 134/83   Pulse (!) 106   Ht 5\' 1"  (1.549 m)   Wt 192 lb (87.1 kg)   SpO2 98%   BMI 36.28 kg/m     Allergies  Allergen Reactions  . Trulicity [Dulaglutide] Nausea Only    Abdominal pain,  . Farxiga [Dapagliflozin]     foggy  . Invokana [Canagliflozin] Other (See Comments)    Back pain  . Jentadueto [Linagliptin-Metformin Hcl Er]     Abdominal pains and diarrhea  . Lipitor [Atorvastatin]     Sudden hair loss 2 weeks after starting lipitor.   Rayaan Bernard [Liraglutide] Other (See Comments)    Abdominal pain and nausea.     Past Medical History:  Diagnosis Date  . Asthma 04-25-07  . Diabetes mellitus 11-12-06   type 2  . Hypertension 11-04-06    Past Surgical History:  Procedure Laterality Date  . ABDOMINAL HYSTERECTOMY  12-11   Complete  . APPENDECTOMY  9-00  . BREAST SURGERY  4-00   of cyst  . FOOT TENDON SURGERY  05/16/2012   bone spur.    Marland Kitchen LAPAROSCOPY  4-08   for cyst and endometriosis  . left breast bx  12-11  Social History   Socioeconomic History  . Marital status: Married    Spouse name: Not on file  . Number of children: Not on file  . Years of education: Not on file  . Highest education level: Not on file  Occupational History  . Not on file  Social Needs  . Financial resource strain: Not on file  . Food insecurity:    Worry: Not on file    Inability: Not on file  . Transportation needs:    Medical: Not on file    Non-medical: Not on file  Tobacco Use  . Smoking status: Current Every Day Smoker    Packs/day: 0.50    Types: Cigarettes  . Smokeless tobacco: Never Used  Substance and Sexual Activity  . Alcohol use: No  . Drug use: No  . Sexual activity: Not on file  Lifestyle  . Physical activity:    Days per week: Not on file    Minutes per session: Not on file  . Stress: Not on file  Relationships  . Social connections:    Talks on phone: Not on file    Gets together: Not on file    Attends religious service: Not on file     Active member of club or organization: Not on file    Attends meetings of clubs or organizations: Not on file    Relationship status: Not on file  . Intimate partner violence:    Fear of current or ex partner: Not on file    Emotionally abused: Not on file    Physically abused: Not on file    Forced sexual activity: Not on file  Other Topics Concern  . Not on file  Social History Narrative  . Not on file    Family History  Problem Relation Age of Onset  . Other Mother        RA and CHF  . Diabetes Father   . Diabetes Other   . Heart disease Other     Outpatient Encounter Medications as of 02/09/2018  Medication Sig  . AMBULATORY NON FORMULARY MEDICATION One Touch Ultra Test Strips.  Diagnosis: DM  Testing 2 times a day  . AMBULATORY NON FORMULARY MEDICATION 1 each by Other route 2 (two) times daily. Lancets for daily glucose checks Diagnosis: DM Testing 2 times a day  . AMBULATORY NON FORMULARY MEDICATION Medication Name: insulin pen needle 25G X 6MM for use for injecting insulin. DX E11.8  . glucose blood (ONE TOUCH ULTRA TEST) test strip USE TO TEST BLOOD SUGAR DAILY  . Insulin Glargine (LANTUS SOLOSTAR) 100 UNIT/ML Solostar Pen Inject 60 Units into the skin 2 (two) times daily. (Patient taking differently: Inject 70 Units into the skin 2 (two) times daily. )  . KOMBIGLYZE XR 03-999 MG TB24 TAKE 1 TABLET BY MOUTH DAILY  . lisinopril (PRINIVIL,ZESTRIL) 20 MG tablet TAKE 1 TABLET BY MOUTH DAILY  . naproxen sodium (ANAPROX) 220 MG tablet Take 220 mg by mouth 2 (two) times daily with a meal. Patient used this medication for pain.  . NON FORMULARY One Touch Ultra View test strips- 2 bottles   . NON FORMULARY Glucometer, strips, and lancets to test daily DX:250.00   . NOVOFINE 32G X 6 MM MISC USE TO INJECT INSULIN UP TO THREE TIMES A DAY  . azithromycin (ZITHROMAX) 250 MG tablet 2 Ttabs PO on Day 1, then one a day x 4 days.  . [DISCONTINUED] diclofenac sodium (VOLTAREN) 1 % GEL Apply  4 g topically 4 (four) times daily. To affected joint.  . [DISCONTINUED] HYDROcodone-acetaminophen (NORCO/VICODIN) 5-325 MG tablet Take 1 tablet by mouth every 6 (six) hours as needed.  . [DISCONTINUED] meclizine (ANTIVERT) 25 MG tablet Take 1 tablet (25 mg total) by mouth 3 (three) times daily as needed for dizziness.  . [DISCONTINUED] ondansetron (ZOFRAN ODT) 4 MG disintegrating tablet Take 1 tablet (4 mg total) by mouth every 8 (eight) hours as needed for nausea.   No facility-administered encounter medications on file as of 02/09/2018.          Objective:   Physical Exam  Constitutional: She is oriented to person, place, and time. She appears well-developed and well-nourished.  HENT:  Head: Normocephalic and atraumatic.  Right Ear: External ear normal.  Left Ear: External ear normal.  Nose: Nose normal.  Mouth/Throat: Oropharynx is clear and moist.  TMs and canals are clear.   Eyes: Pupils are equal, round, and reactive to light. Conjunctivae and EOM are normal.  Neck: Neck supple. No thyromegaly present.  Cardiovascular: Normal rate, regular rhythm and normal heart sounds.  Pulmonary/Chest: Effort normal and breath sounds normal. She has no wheezes.  Lymphadenopathy:    She has no cervical adenopathy.  Neurological: She is alert and oriented to person, place, and time.  Skin: Skin is warm and dry.  Psychiatric: She has a normal mood and affect.          Assessment & Plan:  Headaches -could be secondary to her neck pain.  Explained that neck pain can actually trigger headaches.  Though she is also had persistent pain in her right ear and the right side of her face as well.  She is very worried about the potential for brain tumors etc.  We will try to move forward with an MRI though I do feel reassured.  Vertigo -most consistent with BPPV.  She did get some relief while she was doing the home exercises.  At this point I think she would benefit from vestibular rehab who could  do some more precise treatments especially since she is how having some intermittent symptoms M0 6 weeks later.  Neck pain -she would benefit from physical therapy.  Referral placed.  Acute sinusitis -we will treat with azithromycin.  Though not 100% convinced that she has a true bacterial infection but I think it may be worth treating to see if her headaches improve as well.  Snoring -stop bang questionnaire score of 6, positive for snoring, fatigue, observed apnea, elevated blood pressure, BMI greater than 68, age greater than 84.  Will refer for split sleep study.  Likely will need to be done in lab because she has [employer].  Chronic cough -had 2- chest x-ray that were negative and she is a smoker.  Consider spirometry.

## 2018-02-09 NOTE — Progress Notes (Signed)
Lab ordered for radiology protocol.

## 2018-02-10 DIAGNOSIS — I10 Essential (primary) hypertension: Secondary | ICD-10-CM | POA: Diagnosis not present

## 2018-02-11 LAB — COMPLETE METABOLIC PANEL WITH GFR
AG Ratio: 1.7 (calc) (ref 1.0–2.5)
ALBUMIN MSPROF: 3.8 g/dL (ref 3.6–5.1)
ALKALINE PHOSPHATASE (APISO): 102 U/L (ref 33–130)
ALT: 20 U/L (ref 6–29)
AST: 17 U/L (ref 10–35)
BUN: 9 mg/dL (ref 7–25)
CALCIUM: 9.2 mg/dL (ref 8.6–10.4)
CO2: 29 mmol/L (ref 20–32)
CREATININE: 0.68 mg/dL (ref 0.50–1.05)
Chloride: 105 mmol/L (ref 98–110)
GFR, EST NON AFRICAN AMERICAN: 101 mL/min/{1.73_m2} (ref >=60)
GFR, Est African American: 117 mL/min/{1.73_m2} (ref >=60)
GLOBULIN: 2.3 g/dL (ref 1.9–3.7)
Glucose, Bld: 196 mg/dL — ABNORMAL HIGH (ref 65–99)
Potassium: 4 mmol/L (ref 3.5–5.3)
SODIUM: 141 mmol/L (ref 135–146)
Total Bilirubin: 0.3 mg/dL (ref 0.2–1.2)
Total Protein: 6.1 g/dL (ref 6.1–8.1)

## 2018-02-11 NOTE — Progress Notes (Signed)
All labs are normal. 

## 2018-02-14 ENCOUNTER — Ambulatory Visit: Payer: BLUE CROSS/BLUE SHIELD | Admitting: Family Medicine

## 2018-02-14 ENCOUNTER — Ambulatory Visit (INDEPENDENT_AMBULATORY_CARE_PROVIDER_SITE_OTHER): Payer: BLUE CROSS/BLUE SHIELD

## 2018-02-14 DIAGNOSIS — H8111 Benign paroxysmal vertigo, right ear: Secondary | ICD-10-CM | POA: Diagnosis not present

## 2018-02-14 DIAGNOSIS — G4489 Other headache syndrome: Secondary | ICD-10-CM

## 2018-02-14 DIAGNOSIS — J018 Other acute sinusitis: Secondary | ICD-10-CM | POA: Diagnosis not present

## 2018-02-14 DIAGNOSIS — M542 Cervicalgia: Secondary | ICD-10-CM

## 2018-02-14 DIAGNOSIS — R42 Dizziness and giddiness: Secondary | ICD-10-CM | POA: Diagnosis not present

## 2018-02-14 MED ORDER — GADOBENATE DIMEGLUMINE 529 MG/ML IV SOLN
18.0000 mL | Freq: Once | INTRAVENOUS | Status: AC | PRN
  Administered 2018-02-14: 18 mL via INTRAVENOUS

## 2018-02-16 ENCOUNTER — Other Ambulatory Visit: Payer: Self-pay

## 2018-02-16 DIAGNOSIS — R0683 Snoring: Secondary | ICD-10-CM

## 2018-02-16 NOTE — Progress Notes (Signed)
[facility] called and states insurance will not pay for in office sleep study but will pay for home sleep test. Order placed.

## 2018-02-23 ENCOUNTER — Ambulatory Visit: Payer: BLUE CROSS/BLUE SHIELD | Admitting: Rehabilitative and Restorative Service Providers"

## 2018-02-24 DIAGNOSIS — H8111 Benign paroxysmal vertigo, right ear: Secondary | ICD-10-CM | POA: Diagnosis not present

## 2018-02-24 DIAGNOSIS — E119 Type 2 diabetes mellitus without complications: Secondary | ICD-10-CM | POA: Diagnosis not present

## 2018-03-03 DIAGNOSIS — E119 Type 2 diabetes mellitus without complications: Secondary | ICD-10-CM | POA: Diagnosis not present

## 2018-03-03 DIAGNOSIS — H8111 Benign paroxysmal vertigo, right ear: Secondary | ICD-10-CM | POA: Diagnosis not present

## 2018-03-06 ENCOUNTER — Other Ambulatory Visit: Payer: Self-pay | Admitting: Family Medicine

## 2018-03-06 DIAGNOSIS — E118 Type 2 diabetes mellitus with unspecified complications: Secondary | ICD-10-CM

## 2018-03-21 ENCOUNTER — Encounter (HOSPITAL_BASED_OUTPATIENT_CLINIC_OR_DEPARTMENT_OTHER): Payer: BLUE CROSS/BLUE SHIELD

## 2018-04-06 ENCOUNTER — Encounter: Payer: Self-pay | Admitting: *Deleted

## 2018-04-06 ENCOUNTER — Other Ambulatory Visit: Payer: Self-pay

## 2018-04-06 ENCOUNTER — Emergency Department (INDEPENDENT_AMBULATORY_CARE_PROVIDER_SITE_OTHER)
Admission: EM | Admit: 2018-04-06 | Discharge: 2018-04-06 | Disposition: A | Discharge status: Home / Self Care | Payer: BLUE CROSS/BLUE SHIELD | Source: Home / Self Care | Attending: Family Medicine | Admitting: Family Medicine

## 2018-04-06 DIAGNOSIS — J0100 Acute maxillary sinusitis, unspecified: Secondary | ICD-10-CM

## 2018-04-06 MED ORDER — AMOXICILLIN-POT CLAVULANATE 875-125 MG PO TABS: 1.0000 | ORAL_TABLET | Freq: Two times a day (BID) | ORAL | 0 refills | Status: DC

## 2018-04-06 NOTE — ED Provider Notes (Signed)
[facility]    CSN: [id number] Arrival date & time: 04/06/18  1533     History   Chief Complaint Chief Complaint  Patient presents with  . Cough    HPI Terri Zimmerman is a 52 y.o. female.   HPI  Terri Zimmerman is a 52 y.o. female presenting to UC with c/o 2-3 weeks of worsening nonproductive cough, nasal congestion and bilateral ear pain and pressure.  She has tried OTC cough/cold medications without relief. Hx of sinus infection several years ago. Denies fever now but had a subjective fever when symptoms initially started. Denies n/v/d. No known sick contacts.    Past Medical History:  Diagnosis Date  . Asthma 04-25-07  . Diabetes mellitus 11-12-06   type 2  . Hypertension 11-04-06    Patient Active Problem List   Diagnosis Date Noted  . NAFL (nonalcoholic fatty liver) 86/76/1950  . De Quervain's tenosynovitis, left 02/20/2016  . Patchy loss of hair 03/02/2015  . Tobacco abuse 05/25/2014  . AMENORRHEA 04/17/2010  . PERIPHERAL NEUROPATHY 10/22/2009  . Essential hypertension 11/29/2008  . PALPITATIONS, OCCASIONAL 04/12/2008  . Diabetes mellitus type 2 with complications ([place]) 93/26/7124  . VITAMIN D DEFICIENCY 01/10/2008  . ALLERGIC RHINITIS 01/10/2008  . ASTHMA, UNSPECIFIED, UNSPECIFIED STATUS 01/10/2008    Past Surgical History:  Procedure Laterality Date  . ABDOMINAL HYSTERECTOMY  12-11   Complete  . APPENDECTOMY  9-00  . BREAST SURGERY  4-00   of cyst  . FOOT TENDON SURGERY  05/16/2012   bone spur.    Marland Kitchen LAPAROSCOPY  4-08   for cyst and endometriosis  . left breast bx  12-11    OB History   None      Home Medications    Prior to Admission medications   Medication Sig Start Date End Date Taking? Authorizing Provider  AMBULATORY NON FORMULARY MEDICATION One Touch Ultra Test Strips.  Diagnosis: DM  Testing 2 times a day 11/01/12   Hali Marry, MD  AMBULATORY NON FORMULARY MEDICATION 1 each by Other route 2 (two) times daily.  Lancets for daily glucose checks Diagnosis: DM Testing 2 times a day 03/09/17   Hali Marry, MD  AMBULATORY NON FORMULARY MEDICATION Medication Name: insulin pen needle 25G X 6MM for use for injecting insulin. DX E11.8 04/29/17   Hali Marry, MD  amoxicillin-clavulanate (AUGMENTIN) 875-125 MG tablet Take 1 tablet by mouth 2 (two) times daily. One po bid x 7 days 04/06/18   Noe Gens, PA-C  glucose blood (ONE TOUCH ULTRA TEST) test strip USE TO TEST BLOOD SUGAR DAILY 11/03/17   Hali Marry, MD  Insulin Glargine (LANTUS SOLOSTAR) 100 UNIT/ML Solostar Pen Inject 70 Units into the skin 2 (two) times daily. 03/07/18   Hali Marry, MD  KOMBIGLYZE XR 03-999 MG TB24 TAKE 1 TABLET BY MOUTH DAILY 11/24/17   Hali Marry, MD  lisinopril (PRINIVIL,ZESTRIL) 20 MG tablet TAKE 1 TABLET BY MOUTH DAILY 11/24/17   Hali Marry, MD  naproxen sodium (ANAPROX) 220 MG tablet Take 220 mg by mouth 2 (two) times daily with a meal. Patient used this medication for pain.    [provider]  NON FORMULARY One Touch Ultra View test strips- 2 bottles     [provider]  NON FORMULARY Glucometer, strips, and lancets to test daily DX:250.00     [provider]  NOVOFINE 32G X 6 MM MISC USE TO INJECT INSULIN UP TO  THREE TIMES A DAY 08/05/17   Hali Marry, MD    Family History Family History  Problem Relation Age of Onset  . Other Mother        RA and CHF  . Diabetes Father   . Diabetes Other   . Heart disease Other     Social History Social History   Tobacco Use  . Smoking status: Current Every Day Smoker    Packs/day: 0.50    Types: Cigarettes  . Smokeless tobacco: Never Used  Substance Use Topics  . Alcohol use: No  . Drug use: No     Allergies   Trulicity [dulaglutide]; Farxiga [dapagliflozin]; Invokana [canagliflozin]; Jentadueto [linagliptin-metformin hcl er]; Lipitor [atorvastatin]; and Victoza  [liraglutide]   Review of Systems Review of Systems  Constitutional: Negative for chills and fever.  HENT: Positive for congestion, ear pain, sinus pressure, sinus pain and sore throat. Negative for trouble swallowing and voice change.   Respiratory: Positive for cough. Negative for shortness of breath.   Cardiovascular: Negative for chest pain and palpitations.  Gastrointestinal: Negative for abdominal pain, diarrhea, nausea and vomiting.  Musculoskeletal: Negative for arthralgias, back pain and myalgias.  Skin: Negative for rash.  Neurological: Positive for headaches. Negative for dizziness and light-headedness.     Physical Exam Triage Vital Signs ED Triage Vitals  Enc Vitals Group     BP 04/06/18 1555 (!) 143/86     Pulse Rate 04/06/18 1555 100     Resp 04/06/18 1555 18     Temp 04/06/18 1555 98.3 F (36.8 C)     Temp Source 04/06/18 1555 Oral     SpO2 04/06/18 1555 96 %     Weight 04/06/18 1556 198 lb (89.8 kg)     Height 04/06/18 1556 5\' 1"  (1.549 m)     Head Circumference --      Peak Flow --      Pain Score 04/06/18 1556 0     Pain Loc --      Pain Edu? --      Excl. in [place]? --    No data found.  Updated Vital Signs BP (!) 143/86 (BP Location: Right Arm)   Pulse 100   Temp 98.3 F (36.8 C) (Oral)   Resp 18   Ht 5\' 1"  (1.549 m)   Wt 198 lb (89.8 kg)   SpO2 96%   BMI 37.41 kg/m   Visual Acuity Right Eye Distance:   Left Eye Distance:   Bilateral Distance:    Right Eye Near:   Left Eye Near:    Bilateral Near:     Physical Exam  Constitutional: She is oriented to person, place, and time. She appears well-developed and well-nourished. No distress.  HENT:  Head: Normocephalic and atraumatic.  Right Ear: Tympanic membrane normal.  Left Ear: Tympanic membrane normal.  Nose: Mucosal edema present. Right sinus exhibits maxillary sinus tenderness. Right sinus exhibits no frontal sinus tenderness. Left sinus exhibits maxillary sinus tenderness. Left sinus  exhibits no frontal sinus tenderness.  Mouth/Throat: Uvula is midline, oropharynx is clear and moist and mucous membranes are normal.  Eyes: EOM are normal.  Neck: Normal range of motion. Neck supple.  Cardiovascular: Normal rate and regular rhythm.  Pulmonary/Chest: Effort normal and breath sounds normal. No stridor. No respiratory distress. She has no wheezes. She has no rales.  Musculoskeletal: Normal range of motion.  Lymphadenopathy:    She has no cervical adenopathy.  Neurological: She is alert and oriented to  person, place, and time.  Skin: Skin is warm and dry. She is not diaphoretic.  Psychiatric: She has a normal mood and affect. Her behavior is normal.  Nursing note and vitals reviewed.    UC Treatments / Results  Labs (all labs ordered are listed, but only abnormal results are displayed) Labs Reviewed - No data to display  EKG None  Radiology No results found.  Procedures Procedures (including critical care time)  Medications Ordered in UC Medications - No data to display  Initial Impression / Assessment and Plan / UC Course  I have reviewed the triage vital signs and the nursing notes.  Pertinent labs & imaging results that were available during my care of the patient were reviewed by me and considered in my medical decision making (see chart for details).     Hx and exam c/w bacterial sinusitis.  Final Clinical Impressions(s) / UC Diagnoses   Final diagnoses:  Acute non-recurrent maxillary sinusitis     Discharge Instructions      You may take 500mg  acetaminophen every 4-6 hours or in combination with ibuprofen 400-600mg  every 6-8 hours as needed for pain, inflammation, and fever.  Be sure to drink at least eight 8oz glasses of water to stay well hydrated and get at least 8 hours of sleep at night, preferably more while sick.   Please take antibiotics as prescribed and be sure to complete entire course even if you start to feel better to ensure  infection does not come back.     ED Prescriptions    Medication Sig Dispense Auth. Provider   amoxicillin-clavulanate (AUGMENTIN) 875-125 MG tablet Take 1 tablet by mouth 2 (two) times daily. One po bid x 7 days 14 tablet Noe Gens, [place]     Controlled Substance Prescriptions [place] Controlled Substance Registry consulted? Not Applicable   Tyrell Antonio 04/07/18 1209

## 2018-04-06 NOTE — Discharge Instructions (Signed)
°  You may take 500mg acetaminophen every 4-6 hours or in combination with ibuprofen 400-600mg every 6-8 hours as needed for pain, inflammation, and fever. ° °Be sure to drink at least eight 8oz glasses of water to stay well hydrated and get at least 8 hours of sleep at night, preferably more while sick.  ° °Please take antibiotics as prescribed and be sure to complete entire course even if you start to feel better to ensure infection does not come back. ° °

## 2018-04-06 NOTE — ED Triage Notes (Signed)
Pt c/o nonproductive cough, nasal congestion and bilateral ear pain/ pressure x 2 1/2 wks. Denies fever.

## 2018-04-28 ENCOUNTER — Ambulatory Visit (INDEPENDENT_AMBULATORY_CARE_PROVIDER_SITE_OTHER): Payer: BLUE CROSS/BLUE SHIELD | Admitting: Family Medicine

## 2018-04-28 ENCOUNTER — Encounter: Payer: Self-pay | Admitting: Family Medicine

## 2018-04-28 VITALS — BP 140/80 | HR 110 | Ht 61.0 in | Wt 195.0 lb

## 2018-04-28 DIAGNOSIS — H8111 Benign paroxysmal vertigo, right ear: Secondary | ICD-10-CM | POA: Diagnosis not present

## 2018-04-28 DIAGNOSIS — R0683 Snoring: Secondary | ICD-10-CM

## 2018-04-28 DIAGNOSIS — E118 Type 2 diabetes mellitus with unspecified complications: Secondary | ICD-10-CM

## 2018-04-28 DIAGNOSIS — I10 Essential (primary) hypertension: Secondary | ICD-10-CM

## 2018-04-28 LAB — LIPID PANEL
Cholesterol: 152 mg/dL (ref <200)
HDL: 35 mg/dL — ABNORMAL LOW (ref >50)
LDL Cholesterol (Calc): 98 mg/dL (calc)
Non-HDL Cholesterol (Calc): 117 mg/dL (calc) (ref <130)
Total CHOL/HDL Ratio: 4.3 (calc) (ref <5.0)
Triglycerides: 101 mg/dL (ref <150)

## 2018-04-28 LAB — COMPLETE METABOLIC PANEL WITH GFR
AG RATIO: 1.3 (calc) (ref 1.0–2.5)
ALBUMIN MSPROF: 3.7 g/dL (ref 3.6–5.1)
ALT: 36 U/L — ABNORMAL HIGH (ref 6–29)
AST: 32 U/L (ref 10–35)
Alkaline phosphatase (APISO): 104 U/L (ref 33–130)
BILIRUBIN TOTAL: 0.4 mg/dL (ref 0.2–1.2)
BUN / CREAT RATIO: 8 (calc) (ref 6–22)
BUN: 5 mg/dL — ABNORMAL LOW (ref 7–25)
CO2: 28 mmol/L (ref 20–32)
Calcium: 9.1 mg/dL (ref 8.6–10.4)
Chloride: 103 mmol/L (ref 98–110)
Creat: 0.63 mg/dL (ref 0.50–1.05)
GFR, EST AFRICAN AMERICAN: 120 mL/min/{1.73_m2} (ref >=60)
GFR, Est Non African American: 103 mL/min/{1.73_m2} (ref >=60)
Globulin: 2.8 g/dL (calc) (ref 1.9–3.7)
Glucose, Bld: 288 mg/dL — ABNORMAL HIGH (ref 65–99)
POTASSIUM: 4 mmol/L (ref 3.5–5.3)
Sodium: 138 mmol/L (ref 135–146)
TOTAL PROTEIN: 6.5 g/dL (ref 6.1–8.1)

## 2018-04-28 LAB — POCT GLYCOSYLATED HEMOGLOBIN (HGB A1C): Hemoglobin A1C: 8.3 % — AB (ref 4.0–5.6)

## 2018-04-28 NOTE — Progress Notes (Signed)
Subjective:    CC: DM  HPI:  Diabetes - no hypoglycemic events. No wounds or sores that are not healing well. No increased thirst or urination. Checking glucose at home. Taking medications as prescribed without any side effects.  70 units on the Touje[place].  Hypertension- Pt denies chest pain, SOB, dizziness, or heart palpitations.  Taking meds as directed w/o problems.  Denies medication side effects.    Snoring-they did schedule her home sleep study but she actually just moved about 3 weeks ago so had to call and cancel.  She is yet to call them back but plans to.  She is going to urgent care at the end of May for a sinus infection after feeling sick for about 3 weeks.  She is finally feeling a little bit better she still has a little bit of residual cough but feels like she is almost back to baseline.  She has recently started some Claritin as well.  Her vertigo is significantly better.  She went for the 2 physical therapy sessions which she feels greatly helped.  Is can be changing health insurance company soon.  Past medical history, Surgical history, Family history not pertinant except as noted below, Social history, Allergies, and medications have been entered into the medical record, reviewed, and corrections made.   Review of Systems: No fevers, chills, night sweats, weight loss, chest pain, or shortness of breath.   Objective:    General: Well Developed, well nourished, and in no acute distress.  Neuro: Alert and oriented x3, extra-ocular muscles intact, sensation grossly intact.  HEENT: Normocephalic, atraumatic  Skin: Warm and dry, no rashes. Cardiac: Regular rate and rhythm, no murmurs rubs or gallops, no lower extremity edema.  Respiratory: Clear to auscultation bilaterally. Not using accessory muscles, speaking in full sentences.   Impression and Recommendations:    DM  - Uncontrolled. A1C of 8.3 today.  Much improved.  Continue to work on diet and exercise.  She says  that her low blood sugars lately have looked absolutely fantastic and have been consistently under 125 so I do think we are well on her way to getting her A1c under much better control.  She is up to 70 units on her insulin.  Follow-up in 3 months.  Continue to work on healthy diet and regular exercise.  HTN -borderline blood pressure but we will keep an eye on it.  Snoring-just encouraged her to reschedule her sleep study.  BPPV-resolved.

## 2018-05-04 ENCOUNTER — Telehealth: Payer: Self-pay | Admitting: Family Medicine

## 2018-05-04 NOTE — Telephone Encounter (Signed)
Pt called clinic today to see what kind of multivitamin would be best for her, especially with her Dx of DM. Questions if she should do a regular multivitamin or a women's multivitamin? Also questions what brand is supposed to be better. Will route.

## 2018-05-05 NOTE — Telephone Encounter (Signed)
Patient notified of MD instructions and recommendations. KG LPN

## 2018-05-05 NOTE — Telephone Encounter (Signed)
I recommend Centrum - Women's One a Day.

## 2018-05-25 ENCOUNTER — Other Ambulatory Visit: Payer: Self-pay | Admitting: Family Medicine

## 2018-05-25 DIAGNOSIS — E118 Type 2 diabetes mellitus with unspecified complications: Secondary | ICD-10-CM

## 2018-05-31 ENCOUNTER — Telehealth: Payer: Self-pay | Admitting: Family Medicine

## 2018-05-31 ENCOUNTER — Other Ambulatory Visit: Payer: Self-pay | Admitting: Family Medicine

## 2018-05-31 DIAGNOSIS — Z0000 Encounter for general adult medical examination without abnormal findings: Secondary | ICD-10-CM

## 2018-05-31 NOTE — Telephone Encounter (Signed)
Received fax from [place] that Canones was approved from 05/30/2018 through 05/30/2019. Pharmacy notified and forms sent to scan.   Reference ID: [id number].

## 2018-06-03 ENCOUNTER — Other Ambulatory Visit: Payer: Self-pay | Admitting: Family Medicine

## 2018-06-03 NOTE — Telephone Encounter (Signed)
Received a form that Lantus was approved from 06/03/2018 through 06/03/2019.

## 2018-06-07 ENCOUNTER — Other Ambulatory Visit: Payer: Self-pay

## 2018-06-07 DIAGNOSIS — E118 Type 2 diabetes mellitus with unspecified complications: Secondary | ICD-10-CM

## 2018-06-07 MED ORDER — INSULIN GLARGINE 100 UNIT/ML SOLOSTAR PEN: 70.0000 [IU] | PEN_INJECTOR | Freq: Two times a day (BID) | SUBCUTANEOUS | 6 refills | Status: DC

## 2018-06-10 ENCOUNTER — Other Ambulatory Visit: Payer: Self-pay

## 2018-06-10 DIAGNOSIS — E118 Type 2 diabetes mellitus with unspecified complications: Secondary | ICD-10-CM

## 2018-06-10 MED ORDER — SAXAGLIPTIN-METFORMIN ER 5-1000 MG PO TB24: 1.0000 | ORAL_TABLET | Freq: Every day | ORAL | 1 refills | Status: DC

## 2018-06-10 MED ORDER — INSULIN GLARGINE 100 UNIT/ML SOLOSTAR PEN: 70.0000 [IU] | PEN_INJECTOR | Freq: Two times a day (BID) | SUBCUTANEOUS | 6 refills | Status: DC

## 2018-06-10 NOTE — Progress Notes (Signed)
Pt said her insurance wants these rx's sent to different pharmacy. Chart updated.

## 2018-07-01 ENCOUNTER — Ambulatory Visit (INDEPENDENT_AMBULATORY_CARE_PROVIDER_SITE_OTHER): Payer: Managed Care, Other (non HMO)

## 2018-07-01 DIAGNOSIS — Z1231 Encounter for screening mammogram for malignant neoplasm of breast: Secondary | ICD-10-CM

## 2018-07-01 DIAGNOSIS — Z0000 Encounter for general adult medical examination without abnormal findings: Secondary | ICD-10-CM

## 2018-07-27 ENCOUNTER — Encounter: Payer: Self-pay | Admitting: Family Medicine

## 2018-07-27 ENCOUNTER — Other Ambulatory Visit: Payer: Self-pay | Admitting: Family Medicine

## 2018-07-27 ENCOUNTER — Ambulatory Visit (INDEPENDENT_AMBULATORY_CARE_PROVIDER_SITE_OTHER): Payer: Managed Care, Other (non HMO) | Admitting: Family Medicine

## 2018-07-27 VITALS — BP 123/83 | HR 99 | Ht 61.0 in | Wt 194.0 lb

## 2018-07-27 DIAGNOSIS — Z23 Encounter for immunization: Secondary | ICD-10-CM

## 2018-07-27 DIAGNOSIS — E118 Type 2 diabetes mellitus with unspecified complications: Secondary | ICD-10-CM

## 2018-07-27 DIAGNOSIS — R0683 Snoring: Secondary | ICD-10-CM

## 2018-07-27 DIAGNOSIS — I10 Essential (primary) hypertension: Secondary | ICD-10-CM

## 2018-07-27 LAB — POCT GLYCOSYLATED HEMOGLOBIN (HGB A1C): Hemoglobin A1C: 9.8 % — AB (ref 4.0–5.6)

## 2018-07-27 LAB — POCT UA - MICROALBUMIN
Albumin/Creatinine Ratio, Urine, POC: <30
Creatinine, POC: 100 mg/dL
MICROALBUMIN (UR) POC: 30 mg/L

## 2018-07-27 MED ORDER — INSULIN ASPART 100 UNIT/ML FLEXPEN: 5.0000 [IU] | PEN_INJECTOR | Freq: Three times a day (TID) | SUBCUTANEOUS | 3 refills | Status: DC

## 2018-07-27 NOTE — Progress Notes (Signed)
Subjective:    CC: DM  HPI:  Diabetes - no hypoglycemic events. No wounds or sores that are not healing well. No increased thirst or urination. Checking glucose at home. Taking medications as prescribed without any side effects.  Fasting blood sugars ranging from 1 20-1 35.  But she is still drinking sweet tea.  She has moved and now changed insurances months since I last saw her so she will need to have a new sleep study ordered as she was unable to get the old one done before she changed insurance plans.  He says she is been falling asleep at work and not realizing it until a coworker says that she is sleep or snoring.  She also notes that she wakes up about 4-5 times a night to urinate.  But she says she also drinks a lot of water.  Hypertension- Pt denies chest pain, SOB, dizziness, or heart palpitations.  Taking meds as directed w/o problems.  Denies medication side effects.      Past medical history, Surgical history, Family history not pertinant except as noted below, Social history, Allergies, and medications have been entered into the medical record, reviewed, and corrections made.   Review of Systems: No fevers, chills, night sweats, weight loss, chest pain, or shortness of breath.   Objective:    General: Well Developed, well nourished, and in no acute distress.  Neuro: Alert and oriented x3, extra-ocular muscles intact, sensation grossly intact.  HEENT: Normocephalic, atraumatic  Skin: Warm and dry, no rashes. Cardiac: Regular rate and rhythm, no murmurs rubs or gallops, no lower extremity edema.  Respiratory: Clear to auscultation bilaterally. Not using accessory muscles, speaking in full sentences.   Impression and Recommendations:    DM - Uncontrolled. A1C is up to 9.8. Discussed cutting our sweet tea. We will add mealtime insulin. Will start with 5 units at evening meal about 15 min before.  Continue with Lantus 70 units BID.  We also discussed the option of switching  her to NovoLog 70/30.  But she felt more comfortable sticking with her Lantus and then starting with just adding a mealtime insulin with each meal.  I think that is reasonable.  Asked her to call me back in a week and let me know her blood sugars are doing so that we can adjust them.  It actually sounds like her fasting blood sugars are pretty good overall ranging from 1 20-1 35.  Snoring-we will place new order for home sleep study.  Stop bang score was 6 which was positive for snoring, fatigue, observed sleep apnea, elevated blood pressure, BMI greater than 45 and age greater than 56.  HTN - Well controlled. Continue current regimen. Follow up in  55months.

## 2018-07-27 NOTE — Telephone Encounter (Signed)
Pharmacy sent requesting an alternative,  Routing to pcp for review. Maryruth Eve, [place] Crocker, CMA

## 2018-07-27 NOTE — Patient Instructions (Addendum)
Call me with your blood sugar in about 1 week.   Stay on your Lantus 70 units twice a day.

## 2018-08-19 ENCOUNTER — Telehealth: Payer: Self-pay | Admitting: Family Medicine

## 2018-08-19 ENCOUNTER — Ambulatory Visit (HOSPITAL_BASED_OUTPATIENT_CLINIC_OR_DEPARTMENT_OTHER): Payer: Managed Care, Other (non HMO) | Attending: Family Medicine | Admitting: Internal Medicine

## 2018-08-19 DIAGNOSIS — R0683 Snoring: Secondary | ICD-10-CM | POA: Diagnosis present

## 2018-08-19 DIAGNOSIS — G4733 Obstructive sleep apnea (adult) (pediatric): Secondary | ICD-10-CM | POA: Insufficient documentation

## 2018-08-19 DIAGNOSIS — R0902 Hypoxemia: Secondary | ICD-10-CM | POA: Insufficient documentation

## 2018-08-19 NOTE — Telephone Encounter (Signed)
Pt stated that she would like to discuss this when she comes in for her f/u appt on 09/13/18. She said that she just got her insulin approved and has just started taking this. Will fwd to pcp as an fyi. Terri Zimmerman, [place] Crocker, CMA

## 2018-08-19 NOTE — Telephone Encounter (Signed)
Please call patient and see if she would be willing to try a statin again for her diabetes.  I know she is tried Lipitor in the past and did not do well with it but we could try something more mild like pravastatin and a low dose and see if she is able to tolerate it.  Is on current guidelines for diabetes all diabetics should be on a statin to reduce risk for heart attack and stroke.

## 2018-08-24 ENCOUNTER — Telehealth: Payer: Self-pay | Admitting: Family Medicine

## 2018-08-24 NOTE — Telephone Encounter (Signed)
Received fax from [employer] that Lantus Solustar requires a PA. Information has been sent to the insurance company. Awaiting determination.

## 2018-08-25 NOTE — Telephone Encounter (Signed)
Received fax that patient will have to try and fail Basaglar, Tyler Aas, or Levamir before taking Lantus. Please advise.

## 2018-08-26 MED ORDER — INSULIN DEGLUDEC 100 UNIT/ML ~~LOC~~ SOPN: 70.0000 [IU] | PEN_INJECTOR | Freq: Every day | SUBCUTANEOUS | 3 refills | Status: DC

## 2018-08-26 NOTE — Telephone Encounter (Signed)
I called the patient and she states that she is not switching to any other medication and she is going to stay on Lantus. I am not sure even with an appeal letter that [employer]ella Scheuermann will cover Lantus. Do we need to see about getting samples since she is addiment on keeping this medication.  Please advise.

## 2018-08-26 NOTE — Telephone Encounter (Signed)
Okay, please let patient know that I sent a new prescription for Tresiba.  She can still use the same number of units she was using with the Lantus.  Just let her know that the Lantus was no longer covered under her current insurance plan.

## 2018-08-26 NOTE — Telephone Encounter (Signed)
If we can that would be great.

## 2018-08-27 NOTE — Procedures (Signed)
    Patient Name: Terri, Zimmerman Date: 08/20/2018 Gender: Female D.O.B: 08-02-1966 Age (years): 52 Referring Provider: Beatrice Lecher Height (inches): 26 Interpreting Physician: Baird Lyons MD, ABSM Weight (lbs): 194 RPSGT: Terri Zimmerman BMI: 37 MRN: [id number] Neck Size: 16.00  CLINICAL INFORMATION Sleep Study Type: HST Indication for sleep study: Snoring (786.09) Epworth Sleepiness Score: 9  SLEEP STUDY TECHNIQUE A multi-channel overnight portable sleep study was performed. The channels recorded were: nasal airflow, thoracic respiratory movement, and oxygen saturation with a pulse oximetry. Snoring was also monitored.  MEDICATIONS Patient self administered medications include: none reported.  SLEEP ARCHITECTURE Patient was studied for 528.7 minutes. The sleep efficiency was 100.0 % and the patient was supine for 53.8%. The arousal index was 0.0 per hour.  RESPIRATORY PARAMETERS The overall AHI was 99.0 per hour, with a central apnea index of 0.0 per hour.  The oxygen nadir was 69% during sleep.  CARDIAC DATA Mean heart rate during sleep was 87.1 bpm.  IMPRESSIONS - Severe obstructive sleep apnea occurred during this study (AHI = 99.0/h). - No significant central sleep apnea occurred during this study (CAI = 0.0/h). - Oxygen desaturation was noted during this study (Min O2 = 69%). - Patient snored.  DIAGNOSIS - Obstructive Sleep Apnea (327.23 [G47.33 ICD-10]) - Nocturnal Hypoxemia (327.26 [G47.36 ICD-10])  RECOMMENDATIONS - Suggest CPAP titration or DME autopap. Other options would be based on clinical judgment. - Be careful with alcohol, sedatives and other CNS depressants that may worsen sleep apnea and disrupt normal sleep architecture. - Sleep hygiene should be reviewed to assess factors that may improve sleep quality. - Weight management and regular exercise should be initiated or continued.  [Electronically signed] 08/27/2018 11:12  AM  Baird Lyons MD, ABSM Diplomate, American Board of Sleep Medicine   NPI: [id number]                         Kobey Sides, [place] [facility]  ELECTRONICALLY SIGNED ON:  08/27/2018, 11:09 AM [place] PH: [phone]   FX: [phone] [place]

## 2018-08-29 NOTE — Telephone Encounter (Signed)
Representative has been called and waiting on call back for more information.

## 2018-08-31 NOTE — Telephone Encounter (Signed)
Insurance called and faxed a form to get the patient some assistance on her Lantus. Waiting on determination.

## 2018-09-01 ENCOUNTER — Encounter: Payer: Self-pay | Admitting: Family Medicine

## 2018-09-01 ENCOUNTER — Telehealth: Payer: Self-pay | Admitting: *Deleted

## 2018-09-01 ENCOUNTER — Ambulatory Visit (INDEPENDENT_AMBULATORY_CARE_PROVIDER_SITE_OTHER): Payer: Managed Care, Other (non HMO) | Admitting: Family Medicine

## 2018-09-01 VITALS — BP 132/67 | HR 69 | Ht 61.0 in | Wt 196.0 lb

## 2018-09-01 DIAGNOSIS — G4733 Obstructive sleep apnea (adult) (pediatric): Secondary | ICD-10-CM | POA: Diagnosis not present

## 2018-09-01 MED ORDER — AMBULATORY NON FORMULARY MEDICATION: 0 refills | Status: DC

## 2018-09-01 NOTE — Progress Notes (Signed)
Subjective:    Patient ID: Terri Zimmerman, female    DOB: 1965/11/30, 52 y.o.   MRN: [id number]  HPI 52 year old female is here today to go over her sleep study results she had come in complaining that she was actually falling asleep at work and not even realizing it she wakes up with head pain almost every day.  Her husband says that he is seeing her quit breathing.  Was evaluated with a home sleep study.  AHI of 99 and no central apneas.  Oxygen dropped down to 67%.   Review of Systems  BP 132/67   Pulse 69   Ht 5\' 1"  (1.549 m)   Wt 196 lb (88.9 kg)   SpO2 98%   BMI 37.03 kg/m     Allergies  Allergen Reactions  . Trulicity [Dulaglutide] Nausea Only    Abdominal pain,  . Farxiga [Dapagliflozin]     foggy  . Invokana [Canagliflozin] Other (See Comments)    Back pain  . Jentadueto [Linagliptin-Metformin Hcl Er]     Abdominal pains and diarrhea  . Lipitor [Atorvastatin]     Sudden hair loss 2 weeks after starting lipitor.   Wilburta Bernard [Liraglutide] Other (See Comments)    Abdominal pain and nausea.     Past Medical History:  Diagnosis Date  . Asthma 04-25-07  . Diabetes mellitus 11-12-06   type 2  . Hypertension 11-04-06    Past Surgical History:  Procedure Laterality Date  . ABDOMINAL HYSTERECTOMY  12-11   Complete  . APPENDECTOMY  9-00  . BREAST BIOPSY Left   . BREAST CYST EXCISION Left   . BREAST SURGERY  4-00   of cyst  . FOOT TENDON SURGERY  05/16/2012   bone spur.    Marland Kitchen LAPAROSCOPY  4-08   for cyst and endometriosis  . left breast bx  12-11    Social History   Socioeconomic History  . Marital status: Married    Spouse name: Not on file  . Number of children: Not on file  . Years of education: Not on file  . Highest education level: Not on file  Occupational History  . Not on file  Social Needs  . Financial resource strain: Not on file  . Food insecurity:    Worry: Not on file    Inability: Not on file  . Transportation needs:    Medical: Not on  file    Non-medical: Not on file  Tobacco Use  . Smoking status: Current Every Day Smoker    Packs/day: 0.50    Types: Cigarettes  . Smokeless tobacco: Never Used  Substance and Sexual Activity  . Alcohol use: No  . Drug use: No  . Sexual activity: Not on file  Lifestyle  . Physical activity:    Days per week: Not on file    Minutes per session: Not on file  . Stress: Not on file  Relationships  . Social connections:    Talks on phone: Not on file    Gets together: Not on file    Attends religious service: Not on file    Active member of club or organization: Not on file    Attends meetings of clubs or organizations: Not on file    Relationship status: Not on file  . Intimate partner violence:    Fear of current or ex partner: Not on file    Emotionally abused: Not on file    Physically abused: Not on file  Forced sexual activity: Not on file  Other Topics Concern  . Not on file  Social History Narrative  . Not on file    Family History  Problem Relation Age of Onset  . Other Mother        RA and CHF  . Diabetes Father   . Diabetes Other   . Heart disease Other     Outpatient Encounter Medications as of 09/01/2018  Medication Sig  . AMBULATORY NON FORMULARY MEDICATION One Touch Ultra Test Strips.  Diagnosis: DM  Testing 2 times a day  . AMBULATORY NON FORMULARY MEDICATION 1 each by Other route 2 (two) times daily. Lancets for daily glucose checks Diagnosis: DM Testing 2 times a day  . glucose blood (ONE TOUCH ULTRA TEST) test strip USE TO TEST BLOOD SUGAR DAILY  . Insulin Pen Needle (NOVOFINE) 32G X 6 MM MISC Use to inject insulin up to three (3) times a day Dx: E11.8  . lisinopril (PRINIVIL,ZESTRIL) 20 MG tablet TAKE 1 TABLET BY MOUTH EVERY DAY  . naproxen sodium (ANAPROX) 220 MG tablet Take 220 mg by mouth 2 (two) times daily with a meal. Patient used this medication for pain.  . NON FORMULARY One Touch Ultra View test strips- 2 bottles   . NON FORMULARY  Glucometer, strips, and lancets to test daily DX:250.00   . Saxagliptin-Metformin (KOMBIGLYZE XR) 03-999 MG TB24 Take 1 tablet by mouth daily.  . AMBULATORY NON FORMULARY MEDICATION Medication Name: CPAP and supplies with humidifier set on Autopap for 5-20 cm water pressure.  Dx OSA with AHI 99.  . insulin lispro (HUMALOG KWIKPEN) 100 UNIT/ML KiwkPen Inject 0.05 mLs (5 Units total) into the skin 3 (three) times daily before meals.  . [DISCONTINUED] AMBULATORY NON FORMULARY MEDICATION Medication Name: CPAP and supplies with humidifier.  Dx OSA with AHI 99.  . [DISCONTINUED] insulin degludec (TRESIBA FLEXTOUCH) 100 UNIT/ML SOPN FlexTouch Pen Inject 0.7 mLs (70 Units total) into the skin daily.   No facility-administered encounter medications on file as of 09/01/2018.           Objective:   Physical Exam  Constitutional: She is oriented to person, place, and time. She appears well-developed and well-nourished.  HENT:  Head: Normocephalic and atraumatic.  Eyes: Conjunctivae and EOM are normal.  Cardiovascular: Normal rate.  Pulmonary/Chest: Effort normal.  Neurological: She is alert and oriented to person, place, and time.  Skin: Skin is dry. No pallor.  Psychiatric: She has a normal mood and affect. Her behavior is normal.  Vitals reviewed.       Assessment & Plan:  OSA- New Dx. we reviewed her results in the office today and discuss treatment options.  Because her sleep apnea is severe I would recommend treatment with CPAP versus oral appliance etc.  We also discussed the importance of weight loss and regular exercise.  Hopefully we can treat her apnea she will have improved energy which will help her be able to exercise more regularly and lose weight.  Her AHI was 99 so we will move forward with ordering AutoPap.  After 2 weeks we can get a download and then set her pressure.  Plan I would like to see her back after 1 month of being on the CPAP so that we can troubleshoot any issues  that she be may be having and adjust her pressure if needed.  Spent 25 minutes, greater than 50% of time spent face-to-face counseling about obstructive sleep apnea.

## 2018-09-01 NOTE — Telephone Encounter (Signed)
Order for cpap faxed.confiramtion received .Terri Zimmerman, [place]

## 2018-09-02 ENCOUNTER — Encounter: Payer: Self-pay | Admitting: Family Medicine

## 2018-09-02 DIAGNOSIS — G4733 Obstructive sleep apnea (adult) (pediatric): Secondary | ICD-10-CM | POA: Insufficient documentation

## 2018-09-02 MED ORDER — AMBULATORY NON FORMULARY MEDICATION: 0 refills | Status: DC

## 2018-09-02 MED ORDER — INSULIN DEGLUDEC 100 UNIT/ML ~~LOC~~ SOPN: 60.0000 [IU] | PEN_INJECTOR | Freq: Every day | SUBCUTANEOUS | 2 refills | Status: DC

## 2018-09-02 NOTE — Telephone Encounter (Signed)
We received notification from her insurance that they will pay for Basaglar, Levemir or Tresiba instead of the Lantus.  So I sent over new prescription for Tyler Aas for her to try for a month if it is working well and we know what dose is working well for her then we can always send a 90-day supply.

## 2018-09-05 NOTE — Telephone Encounter (Signed)
Patient states now a peer to peer is the next option at this time. She is not willing to change medication.  Patient is aware that PCP is out of the office this week and will work on the at a later time. Closing encounter.

## 2018-09-06 NOTE — Telephone Encounter (Signed)
Patient still hasn't heard back from the CPAP order.

## 2018-09-13 ENCOUNTER — Ambulatory Visit (INDEPENDENT_AMBULATORY_CARE_PROVIDER_SITE_OTHER): Payer: Managed Care, Other (non HMO) | Admitting: Family Medicine

## 2018-09-13 ENCOUNTER — Encounter: Payer: Self-pay | Admitting: Family Medicine

## 2018-09-13 VITALS — BP 138/88 | HR 101 | Temp 98.4°F | Ht 61.61 in | Wt 194.0 lb

## 2018-09-13 DIAGNOSIS — E118 Type 2 diabetes mellitus with unspecified complications: Secondary | ICD-10-CM

## 2018-09-13 DIAGNOSIS — I10 Essential (primary) hypertension: Secondary | ICD-10-CM | POA: Diagnosis not present

## 2018-09-13 DIAGNOSIS — J069 Acute upper respiratory infection, unspecified: Secondary | ICD-10-CM

## 2018-09-13 DIAGNOSIS — R509 Fever, unspecified: Secondary | ICD-10-CM | POA: Diagnosis not present

## 2018-09-13 DIAGNOSIS — G609 Hereditary and idiopathic neuropathy, unspecified: Secondary | ICD-10-CM

## 2018-09-13 LAB — POCT INFLUENZA A/B
INFLUENZA A, POC: NEGATIVE
INFLUENZA B, POC: NEGATIVE

## 2018-09-13 MED ORDER — HYDROCODONE-HOMATROPINE 5-1.5 MG/5ML PO SYRP: 5.0000 mL | ORAL_SOLUTION | Freq: Every evening | ORAL | 0 refills | Status: DC | PRN

## 2018-09-13 NOTE — Patient Instructions (Signed)
Upper Respiratory Infection, Adult Most upper respiratory infections (URIs) are a viral infection of the air passages leading to the lungs. A URI affects the nose, throat, and upper air passages. The most common type of URI is nasopharyngitis and is typically referred to as "the common cold." URIs run their course and usually go away on their own. Most of the time, a URI does not require medical attention, but sometimes a bacterial infection in the upper airways can follow a viral infection. This is called a secondary infection. Sinus and middle ear infections are common types of secondary upper respiratory infections. Bacterial pneumonia can also complicate a URI. A URI can worsen asthma and chronic obstructive pulmonary disease (COPD). Sometimes, these complications can require emergency medical care and may be life threatening. What are the causes? Almost all URIs are caused by viruses. A virus is a type of germ and can spread from one person to another. What increases the risk? You may be at risk for a URI if:  You smoke.  You have chronic heart or lung disease.  You have a weakened defense (immune) system.  You are very young or very old.  You have nasal allergies or asthma.  You work in crowded or poorly ventilated areas.  You work in health care facilities or schools.  What are the signs or symptoms? Symptoms typically develop 2-3 days after you come in contact with a cold virus. Most viral URIs last 7-10 days. However, viral URIs from the influenza virus (flu virus) can last 14-18 days and are typically more severe. Symptoms may include:  Runny or stuffy (congested) nose.  Sneezing.  Cough.  Sore throat.  Headache.  Fatigue.  Fever.  Loss of appetite.  Pain in your forehead, behind your eyes, and over your cheekbones (sinus pain).  Muscle aches.  How is this diagnosed? Your health care provider may diagnose a URI by:  Physical exam.  Tests to check that your  symptoms are not due to another condition such as: ? Strep throat. ? Sinusitis. ? Pneumonia. ? Asthma.  How is this treated? A URI goes away on its own with time. It cannot be cured with medicines, but medicines may be prescribed or recommended to relieve symptoms. Medicines may help:  Reduce your fever.  Reduce your cough.  Relieve nasal congestion.  Follow these instructions at home:  Take medicines only as directed by your health care provider.  Gargle warm saltwater or take cough drops to comfort your throat as directed by your health care provider.  Use a warm mist humidifier or inhale steam from a shower to increase air moisture. This may make it easier to breathe.  Drink enough fluid to keep your urine clear or pale yellow.  Eat soups and other clear broths and maintain good nutrition.  Rest as needed.  Return to work when your temperature has returned to normal or as your health care provider advises. You may need to stay home longer to avoid infecting others. You can also use a face mask and careful hand washing to prevent spread of the virus.  Increase the usage of your inhaler if you have asthma.  Do not use any tobacco products, including cigarettes, chewing tobacco, or electronic cigarettes. If you need help quitting, ask your health care provider. How is this prevented? The best way to protect yourself from getting a cold is to practice good hygiene.  Avoid oral or hand contact with people with cold symptoms.  Wash your   hands often if contact occurs.  There is no clear evidence that vitamin C, vitamin E, echinacea, or exercise reduces the chance of developing a cold. However, it is always recommended to get plenty of rest, exercise, and practice good nutrition. Contact a health care provider if:  You are getting worse rather than better.  Your symptoms are not controlled by medicine.  You have chills.  You have worsening shortness of breath.  You have  brown or red mucus.  You have yellow or brown nasal discharge.  You have pain in your face, especially when you bend forward.  You have a fever.  You have swollen neck glands.  You have pain while swallowing.  You have white areas in the back of your throat. Get help right away if:  You have severe or persistent: ? Headache. ? Ear pain. ? Sinus pain. ? Chest pain.  You have chronic lung disease and any of the following: ? Wheezing. ? Prolonged cough. ? Coughing up blood. ? A change in your usual mucus.  You have a stiff neck.  You have changes in your: ? Vision. ? Hearing. ? Thinking. ? Mood. This information is not intended to replace advice given to you by your health care provider. Make sure you discuss any questions you have with your health care provider. Document Released: 04/28/2001 Document Revised: 07/05/2016 Document Reviewed: 02/07/2014 Elsevier Interactive Patient Education  2018 [employer]  

## 2018-09-13 NOTE — Progress Notes (Signed)
Subjective:    CC: feels sick today, DM f/u   HPI:  Hypertension- Pt denies chest pain, SOB, dizziness, or heart palpitations.  Taking meds as directed w/o problems.  Denies medication side effects.    Diabetes - no hypoglycemic events. No wounds or sores that are not healing well. No increased thirst or urination. Checking glucose at home. Taking medications as prescribed without any side effects.  She did try the Humalog for about 3 weeks but says that it actually was making her pretty nauseated so she eventually just stopped it.  Cough, sneezing, aching and low grade fever started yesterday. Didn't sleep well last night.  Using Robitussin but it does not really seem to be helping.  She is had a lot of pressure in her ears as well.  Cough is productive.  Obstructive sleep apnea-she finally got a call yesterday after calling her office multiple times last week saying that she had not heard from the home health company.  Past medical history, Surgical history, Family history not pertinant except as noted below, Social history, Allergies, and medications have been entered into the medical record, reviewed, and corrections made.   Review of Systems: No fevers, chills, night sweats, weight loss, chest pain, or shortness of breath.   Objective:    General: Well Developed, well nourished, and in no acute distress.  Neuro: Alert and oriented x3, extra-ocular muscles intact, sensation grossly intact.  HEENT: Normocephalic, atraumatic oropharynx is clear, TMs and canals are clear bilaterally.  No significant cervical lymphadenopathy. Skin: Warm and dry, no rashes. Cardiac: Regular rate and rhythm, no murmurs rubs or gallops, no lower extremity edema.  Respiratory: Clear to auscultation bilaterally. Not using accessory muscles, speaking in full sentences.   Impression and Recommendations:    HTN - Well controlled. Continue current regimen. Follow up in  3 months.   DM -she has not been able to  tolerate Humalog she feels pretty confident that has caused nausea though this is extremely unusual.  She is now using 70 units of [place].  Point we will continue with current regimen and then I will see her back in January to see how her next A1c is doing.  Hopefully if we can also get her sleep apnea treated that may help with energy levels and may also help her lose weight and in turn help with her diabetes.  Objective sleep apnea-she is to call them back to discuss financials and get set up for her CPAP.  She has severe sleep apnea.  URI -did swab her for flu since she was running a temperature at home with cough runny nose.  It was negative so we will treat as a respiratory viral illness.  Given hydrocodone cough syrup.  Okay to continue symptom Medicare.  Call if not feeling some better in 7 to 10 days.

## 2018-09-15 ENCOUNTER — Telehealth: Payer: Self-pay | Admitting: Family Medicine

## 2018-09-15 NOTE — Telephone Encounter (Signed)
Received a shipment of Lantus in the office for the patient since insurance would not approve. Patient said she has enough medication until her next appointment and she will pick up at the next visit.

## 2018-09-19 ENCOUNTER — Telehealth: Payer: Self-pay | Admitting: Family Medicine

## 2018-09-19 NOTE — Telephone Encounter (Signed)
Terri Zimmerman called this morning wondering if an antibiotic could be called in for her. She was seen in the office last Monday and is not feeling any better. Her sinuses are bothering her as well. [facility]

## 2018-09-20 MED ORDER — ALBUTEROL SULFATE HFA 108 (90 BASE) MCG/ACT IN AERS: 2.0000 | INHALATION_SPRAY | Freq: Four times a day (QID) | RESPIRATORY_TRACT | 0 refills | Status: DC | PRN

## 2018-09-20 MED ORDER — AZITHROMYCIN 250 MG PO TABS: ORAL_TABLET | ORAL | 0 refills | Status: AC

## 2018-09-20 NOTE — Telephone Encounter (Signed)
Pt advised.   She would also like her inhaler sent to the pharmacy. Do not see on Rx list. Pt states PCP was going to add it back and send over refill to the pharmacy.

## 2018-09-20 NOTE — Telephone Encounter (Signed)
Pt advised.

## 2018-09-20 NOTE — Telephone Encounter (Signed)
Albuterol sent to pharmacy as well.

## 2018-09-20 NOTE — Telephone Encounter (Signed)
Zpack sent in. 

## 2018-09-28 ENCOUNTER — Other Ambulatory Visit: Payer: Self-pay | Admitting: Family Medicine

## 2018-09-29 ENCOUNTER — Ambulatory Visit: Payer: Managed Care, Other (non HMO) | Admitting: Family Medicine

## 2018-10-07 ENCOUNTER — Encounter: Payer: Self-pay | Admitting: Family Medicine

## 2018-10-14 ENCOUNTER — Encounter: Payer: Self-pay | Admitting: Family Medicine

## 2018-10-19 ENCOUNTER — Telehealth: Payer: Self-pay | Admitting: *Deleted

## 2018-10-19 ENCOUNTER — Ambulatory Visit (INDEPENDENT_AMBULATORY_CARE_PROVIDER_SITE_OTHER): Payer: Managed Care, Other (non HMO) | Admitting: Family Medicine

## 2018-10-19 ENCOUNTER — Encounter: Payer: Self-pay | Admitting: Family Medicine

## 2018-10-19 VITALS — BP 136/78 | HR 97 | Ht 60.83 in | Wt 192.0 lb

## 2018-10-19 DIAGNOSIS — E118 Type 2 diabetes mellitus with unspecified complications: Secondary | ICD-10-CM | POA: Diagnosis not present

## 2018-10-19 DIAGNOSIS — J019 Acute sinusitis, unspecified: Secondary | ICD-10-CM | POA: Diagnosis not present

## 2018-10-19 DIAGNOSIS — G4733 Obstructive sleep apnea (adult) (pediatric): Secondary | ICD-10-CM | POA: Diagnosis not present

## 2018-10-19 LAB — POCT GLYCOSYLATED HEMOGLOBIN (HGB A1C): HEMOGLOBIN A1C: 9.4 % — AB (ref 4.0–5.6)

## 2018-10-19 MED ORDER — AMBULATORY NON FORMULARY MEDICATION: 0 refills | Status: DC

## 2018-10-19 MED ORDER — AMOXICILLIN-POT CLAVULANATE 875-125 MG PO TABS: 1.0000 | ORAL_TABLET | Freq: Two times a day (BID) | ORAL | 0 refills | Status: DC

## 2018-10-19 NOTE — Telephone Encounter (Signed)
lvm asking that they fax pt's most recent download (again). We cannot locate it. Left main # fax.Maryruth Eve, Lahoma Crocker, CMA

## 2018-10-19 NOTE — Addendum Note (Signed)
Addended by: Beatrice Lecher D on: 10/19/2018 03:37 PM   Modules accepted: Orders

## 2018-10-19 NOTE — Progress Notes (Addendum)
Subjective:    CC: F/U OSA  HPI:  She feels like she is still struggling after recent URI. I saw her for URI on 10/29. She still feels tired and like her ears are plugged and starting to hurt again.  No fevers chills or sweats..  She didn't improve and we sent a zpack into pharmacy on 11/4 for her.  Noticing some yellow sputum with her cough.  Does have a sore throat.  She is also here to follow-up for obstructive sleep apnea.  Family got her machine and supplies.  She is currently set on AutoPap from 5 to 20 cm of water pressure.  She says for about the first 3 to 4 hours at night she actually sleeps well and does really well with it.  Her husband noticed that she is really much less restless.  But around that time she will wake up and feel like the pressure is so high that she cannot stand it and has to take it off.  Sometimes she is able to get back on and reset everything but sometimes she says she ends up just lying in the floor.  Plus she is still been sick as noted above and that made it a little bit more challenging to wear it.  DM - she is using 70 units of Lantus BID.    She says the lowest blood sugar she seen is 120 and that is pretty rare.  Most of her blood sugars are still running in the 200s with the occasional 300.  She is taking the Kombiglyze.  Past medical history, Surgical history, Family history not pertinant except as noted below, Social history, Allergies, and medications have been entered into the medical record, reviewed, and corrections made.   Review of Systems: No fevers, chills, night sweats, weight loss, chest pain, or shortness of breath.   Objective:    General: Well Developed, well nourished, and in no acute distress.  Neuro: Alert and oriented x3, extra-ocular muscles intact, sensation grossly intact.  HEENT: Normocephalic, atraumatic, OP is clear. TMs and canals are clear bilatearlly.   Skin: Warm and dry, no rashes. Cardiac: Regular rate and rhythm, no  murmurs rubs or gallops, no lower extremity edema.  Respiratory: Clear to auscultation bilaterally. Not using accessory muscles, speaking in full sentences.   Impression and Recommendations:   Acute sinusitis -treat with Augmentin for 10 days if not improving please let me know.  Recommend a trial Mucinex if she has a lot of sputum.  DM -controlled.  Hemoglobin A1c 9.4 today.  We discussed options.  Will continue with Kombiglyze and increase Lantus to 80 units twice a day.  I am hoping that if we can get the infection cleared up and she is able to use her CPAP consistently this will help with energy level she will become more active and be able to work on losing some weight which will in turn help with her glucose. She has lost 4 lbs since October.    Obstructive sleep apnea-we will call air care and get a download from her CPAP to see what pressure she needs to be set at.  I will likely set her a couple of centimeters water pressure lower and see if she is able to tolerate that over the next couple of weeks.  95th percentile for pressure needed for CPAP on her compliance report was 15.9.  The median being 11.6.  Going to go ahead and try to set her at 12 cm water  pressure for nowto see if she is able to tolerate it since it feels like at times her pressure gets too high and she has to take it off in the middle the night.  She can let me know after couple weeks if that seems to be working well.

## 2018-10-19 NOTE — Telephone Encounter (Signed)
Order for download faxed, confirmation received.Terri Zimmerman, Terri Zimmerman

## 2018-10-19 NOTE — Patient Instructions (Signed)
Increase Lantus to 80 units twice a day.

## 2018-10-28 ENCOUNTER — Telehealth: Payer: Self-pay

## 2018-10-28 DIAGNOSIS — G4733 Obstructive sleep apnea (adult) (pediatric): Secondary | ICD-10-CM

## 2018-10-28 NOTE — Telephone Encounter (Signed)
Terri Zimmerman called and states the pressure of the CPAP is slightly better. She is only able to sleep 4-5 hours with the CPAP due to the pressure.

## 2018-10-30 NOTE — Telephone Encounter (Signed)
[place] call for CPAP download on the reduced pressure .  If it still looks OK then we can try an even  lower pressure and see if that helps. Marland Kitchen

## 2018-10-31 NOTE — Telephone Encounter (Signed)
Report requested

## 2018-11-01 NOTE — Telephone Encounter (Signed)
Call pt: review download results.  She is set to 12 cm and her levels look great!!!!  See if she feels better on this pressure.

## 2018-11-02 MED ORDER — AMBULATORY NON FORMULARY MEDICATION: 0 refills | Status: DC

## 2018-11-02 NOTE — Telephone Encounter (Signed)
Called Pt to advise of results. She reports the pressure still feels too high. She states if she breathes through her nose it feels ok, but if she opens her mouth it feels like its going to suffocate her it's so much pressure.  Routing.

## 2018-11-02 NOTE — Telephone Encounter (Signed)
K, lets have him decrease the pressure down to 10 cm of pressure and then have them fax [place] a download in about 2 weeks with the 10 m pressure.

## 2018-11-02 NOTE — Telephone Encounter (Signed)
New Rx printed to be faxed to [place]. Pt advised.

## 2018-11-06 ENCOUNTER — Encounter: Payer: Self-pay | Admitting: Family Medicine

## 2018-11-14 ENCOUNTER — Other Ambulatory Visit: Payer: Self-pay | Admitting: *Deleted

## 2018-11-15 ENCOUNTER — Other Ambulatory Visit: Payer: Self-pay

## 2018-11-15 MED ORDER — INSULIN GLARGINE 100 UNIT/ML SOLOSTAR PEN: 80.0000 [IU] | PEN_INJECTOR | Freq: Two times a day (BID) | SUBCUTANEOUS | 1 refills | Status: DC

## 2018-11-17 ENCOUNTER — Other Ambulatory Visit: Payer: Self-pay | Admitting: Family Medicine

## 2018-11-18 ENCOUNTER — Telehealth: Payer: Self-pay | Admitting: Family Medicine

## 2018-11-18 NOTE — Telephone Encounter (Signed)
Terri Zimmerman states she has only been on the pressure of 10 for 2-3 days.   She will talk to Dr Madilyn Fireman about a statin at the next visit.  Terri Zimmerman,   A PA needs to be done on the Lantus for the increased dose of 80 units bid.   Thanks

## 2018-11-18 NOTE — Telephone Encounter (Signed)
Call [facility]: Did they reset her pressure to 10? The report they just faxed me still had pressure of 12 and it was dated 12/22.

## 2018-11-18 NOTE — Telephone Encounter (Signed)
Routing to pcp for review. New prescription came in from pharmacy requesting change of medication.Elouise Munroe, [place]

## 2018-11-18 NOTE — Telephone Encounter (Signed)
We actually got a request to switch her to Nancee Liter because of the insurance so she is on Basaglar 80 units twice a day.  It may still require PA though.

## 2018-11-18 NOTE — Telephone Encounter (Signed)
In addition to note below, see if she is willing to try a different statin from Lipitor?

## 2018-11-21 NOTE — Telephone Encounter (Signed)
I called the patient and she is very reluctant to start the [place] and she states  "Ok you can send this in to the pharmacy and I will take it for a month and then I will be in the hospital and then back to see Dr. Madilyn Fireman in the next month and a half" PCP is aware.   I have called the drug representative to see if he has samples and Terri Zimmerman is waiting to see if he can get a shipment of the medication and he would come by the office if he is able to get any to bring in for the patient.   I am more than willing to work on a PA for this patient.

## 2018-11-22 ENCOUNTER — Other Ambulatory Visit: Payer: Self-pay

## 2018-11-22 MED ORDER — BASAGLAR KWIKPEN 100 UNIT/ML ~~LOC~~ SOPN: 80.0000 [IU] | PEN_INJECTOR | Freq: Two times a day (BID) | SUBCUTANEOUS | 99 refills | Status: DC

## 2018-11-23 ENCOUNTER — Other Ambulatory Visit: Payer: Self-pay

## 2018-11-23 MED ORDER — BASAGLAR KWIKPEN 100 UNIT/ML ~~LOC~~ SOPN: 80.0000 [IU] | PEN_INJECTOR | Freq: Two times a day (BID) | SUBCUTANEOUS | 99 refills | Status: DC

## 2018-11-23 NOTE — Telephone Encounter (Signed)
Claiborne Rigg comes in a pack of 3 pens. The prescription was sent with 20 pen and it needed to be 18 pens for a 90 day prescription. The prescription will only get paid at the correct amount of pens. Sent in a new prescription for 18 pen for a 90 day supply. [facility] [address] [facility] order because the [place] come 5 pens to a box and they are unable to break a box open. They [facility] [employer] because they are able to separate the box.

## 2018-11-23 NOTE — Telephone Encounter (Signed)
PA not required for Basaglar. Call the number on the back of your patient's card for assistance, if needed. [facility] and [facility].

## 2018-11-28 ENCOUNTER — Other Ambulatory Visit: Payer: Self-pay | Admitting: Family Medicine

## 2018-11-29 ENCOUNTER — Telehealth: Payer: Self-pay

## 2018-11-29 NOTE — Telephone Encounter (Signed)
The pharmacy called and state the Terri Zimmerman needs a PA for the amount she is taking.

## 2018-11-29 NOTE — Telephone Encounter (Signed)
See other note since PA is not required per insurance. Pharmacy aware.

## 2018-11-30 ENCOUNTER — Ambulatory Visit (INDEPENDENT_AMBULATORY_CARE_PROVIDER_SITE_OTHER): Payer: Managed Care, Other (non HMO) | Admitting: Sports Medicine

## 2018-11-30 ENCOUNTER — Encounter: Payer: Self-pay | Admitting: Family Medicine

## 2018-11-30 ENCOUNTER — Ambulatory Visit (INDEPENDENT_AMBULATORY_CARE_PROVIDER_SITE_OTHER): Payer: Managed Care, Other (non HMO) | Admitting: Family Medicine

## 2018-11-30 VITALS — BP 143/81 | HR 94 | Ht 61.0 in | Wt 193.0 lb

## 2018-11-30 DIAGNOSIS — I10 Essential (primary) hypertension: Secondary | ICD-10-CM | POA: Diagnosis not present

## 2018-11-30 DIAGNOSIS — G4733 Obstructive sleep apnea (adult) (pediatric): Secondary | ICD-10-CM

## 2018-11-30 DIAGNOSIS — E118 Type 2 diabetes mellitus with unspecified complications: Secondary | ICD-10-CM

## 2018-11-30 DIAGNOSIS — M654 Radial styloid tenosynovitis [de Quervain]: Secondary | ICD-10-CM | POA: Diagnosis not present

## 2018-11-30 MED ORDER — PRAVASTATIN SODIUM 20 MG PO TABS: 20.0000 mg | ORAL_TABLET | Freq: Every day | ORAL | 3 refills | Status: DC

## 2018-11-30 MED ORDER — AMBULATORY NON FORMULARY MEDICATION: 1.0000 | Freq: Two times a day (BID) | 5 refills | Status: DC

## 2018-11-30 NOTE — Addendum Note (Signed)
Addended by: Beatrice Lecher D on: 11/30/2018 12:20 PM   Modules accepted: Orders

## 2018-11-30 NOTE — Assessment & Plan Note (Signed)
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 Injection was somewhat difficult due to the degree of tenosynovitis. Luckily she had complete pain relief immediately after the shot. Continue brace for a week, rehab exercises and return to see me as needed.

## 2018-11-30 NOTE — Progress Notes (Signed)
Subjective:    CC: DM  HPI:  Diabetes - no hypoglycemic events. No wounds or sores that are not healing well. No increased thirst or urination. Checking glucose at home. Taking medications as prescribed without any side effects. She is having problems with her basaglar being covered at 80 units BID.  She has been taking it and her sugars have been running from 90-200.  She says she will agree to start a statin.   Hypertension- Pt denies chest pain, SOB, dizziness, or heart palpitations.  Taking meds as directed w/o problems.  Denies medication side effects.    All obstructive sleep apnea-she has been trying to wear her CPAP much more consistently and has noticed a big difference in how she feels the next day.  She is not falling asleep easily.  She says she has been having to get up to urinate a little bit more at night and so that kind of has interrupted her wearing the CPAP sometimes she will put it back on when she tries to go back to sleep.  Past medical history, Surgical history, Family history not pertinant except as noted below, Social history, Allergies, and medications have been entered into the medical record, reviewed, and corrections made.   Review of Systems: No fevers, chills, night sweats, weight loss, chest pain, or shortness of breath.   Objective:    General: Well Developed, well nourished, and in no acute distress.  Neuro: Alert and oriented x3, extra-ocular muscles intact, sensation grossly intact.  HEENT: Normocephalic, atraumatic, face is flushed today.   Skin: Warm and dry, no rashes. Cardiac: Regular rate and rhythm, no murmurs rubs or gallops, no lower extremity edema.  Respiratory: Clear to auscultation bilaterally. Not using accessory muscles, speaking in full sentences.   Impression and Recommendations:    DM -we discussed options.  Will check on prior authorization for the units for the [place].  It sounds like she still running in the low 200s but also may  have had some borderline lows.  She is only been on it consistently for about a week at this point so she will continue to track her sugars over the next week and then give [place] a call.  She is willing to start a statin so new prescription sent to the pharmacy.  Foot exam performed today.  Obstructive sleep apnea-okay on CPAP 10 centimeters water pressure.  She says that she is having to get up to urinate a little bit more frequently and then has a hard time putting it back on but she does feel much better.  Much less sedation and sleepiness during the daytime.  HTN -repeat blood pressure still just borderline elevated today.  She has been in a lot of pain and actually is seeing 1 of our sports med doctors for an injection.  We will just keep an eye on it and recheck at follow-up.

## 2018-11-30 NOTE — Progress Notes (Signed)
Subjective:    CC: Wrist pain  HPI: Terri Zimmerman is a pleasant 53 year old female with a history of de Quervain's tenosynovitis.  We last did an injection in the spring 2018.  She did well until recently, and started to have a recurrence of pain, severe, located on the radial aspect of her left wrist, radiation up past the elbow.  Worse with gripping and ulnar deviation of the wrist.  She has tried some oral analgesics and desires acute interventional treatment today.  I reviewed the past medical history, family history, social history, surgical history, and allergies today and no changes were needed.  Please see the problem list section below in epic for further details.  Past Medical History: Past Medical History:  Diagnosis Date  . Asthma 04-25-07  . Diabetes mellitus 11-12-06   type 2  . Hypertension 11-04-06   Past Surgical History: Past Surgical History:  Procedure Laterality Date  . ABDOMINAL HYSTERECTOMY  12-11   Complete  . APPENDECTOMY  9-00  . BREAST BIOPSY Left   . BREAST CYST EXCISION Left   . BREAST SURGERY  4-00   of cyst  . FOOT TENDON SURGERY  05/16/2012   bone spur.    Marland Kitchen LAPAROSCOPY  4-08   for cyst and endometriosis  . left breast bx  12-11   Social History: Social History   Socioeconomic History  . Marital status: Married    Spouse name: Not on file  . Number of children: Not on file  . Years of education: Not on file  . Highest education level: Not on file  Occupational History  . Not on file  Social Needs  . Financial resource strain: Not on file  . Food insecurity:    Worry: Not on file    Inability: Not on file  . Transportation needs:    Medical: Not on file    Non-medical: Not on file  Tobacco Use  . Smoking status: Current Every Day Smoker    Packs/day: 0.50    Types: Cigarettes  . Smokeless tobacco: Never Used  Substance and Sexual Activity  . Alcohol use: No  . Drug use: No  . Sexual activity: Not on file  Lifestyle  . Physical  activity:    Days per week: Not on file    Minutes per session: Not on file  . Stress: Not on file  Relationships  . Social connections:    Talks on phone: Not on file    Gets together: Not on file    Attends religious service: Not on file    Active member of club or organization: Not on file    Attends meetings of clubs or organizations: Not on file    Relationship status: Not on file  Other Topics Concern  . Not on file  Social History Narrative  . Not on file   Family History: Family History  Problem Relation Age of Onset  . Other Mother        RA and CHF  . Diabetes Father   . Diabetes Other   . Heart disease Other    Allergies: Allergies  Allergen Reactions  . Trulicity [Dulaglutide] Nausea Only    Abdominal pain,  . Farxiga [Dapagliflozin]     foggy  . Invokana [Canagliflozin] Other (See Comments)    Back pain  . Jentadueto [Linagliptin-Metformin Hcl Er]     Abdominal pains and diarrhea  . Lipitor [Atorvastatin]     Sudden hair loss 2 weeks after starting lipitor.   Marland Kitchen  Victoza [Liraglutide] Other (See Comments)    Abdominal pain and nausea.    Medications: See med rec.  Review of Systems: No fevers, chills, night sweats, weight loss, chest pain, or shortness of breath.   Objective:    General: Well Developed, well nourished, and in no acute distress.  Neuro: Alert and oriented x3, extra-ocular muscles intact, sensation grossly intact.  HEENT: Normocephalic, atraumatic, pupils equal round reactive to light, neck supple, no masses, no lymphadenopathy, thyroid nonpalpable.  Skin: Warm and dry, no rashes. Cardiac: Regular rate and rhythm, no murmurs rubs or gallops, no lower extremity edema.  Respiratory: Clear to auscultation bilaterally. Not using accessory muscles, speaking in full sentences. Left wrist: Inspection normal with no visible erythema or swelling. ROM smooth and normal with good flexion and extension and ulnar/radial deviation that is  symmetrical with opposite wrist. Palpation is normal over metacarpals, navicular, lunate, and TFCC; tendons without tenderness/ swelling No snuffbox tenderness. No tenderness over [place]. Strength 5/5 in all directions without pain. Negative tinel's and phalens signs. Tender to palpation over the first extensor compartment, positive Finkelstein sign. Negative Watson's test.  Procedure: Real-time Ultrasound Guided Injection of left first extensor compartment Device: GE Logiq E  Verbal informed consent obtained.  Time-out conducted.  Noted no overlying erythema, induration, or other signs of local infection.  Skin prepped in a sterile fashion.  Local anesthesia: Topical Ethyl chloride.  With sterile technique and under real time ultrasound guidance: Noted copious synovitis in the tendon sheath, in fact when I advanced the needle between the APL and the EPB I did have a great deal of difficulty injecting medicine into the tendon sheath itself, in spite of confirmation that we were not in an intratendinous location. Pain immediately resolved suggesting accurate placement of the medication.  Advised to call if fevers/chills, erythema, induration, drainage, or persistent bleeding.  Images permanently stored and available for review in the ultrasound unit.  Impression: Technically successful ultrasound guided injection.  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 Injection was somewhat difficult due to the degree of tenosynovitis. Luckily she had complete pain relief immediately after the shot. Continue brace for a week, rehab exercises and return to see me as needed. ___________________________________________ Gwen Her. Dianah Field, M.D., ABFM., CAQSM. Primary Care and Sports Medicine [facility]  Adjunct Professor of [employer]

## 2018-12-12 ENCOUNTER — Telehealth: Payer: Self-pay | Admitting: Family Medicine

## 2018-12-12 DIAGNOSIS — G4733 Obstructive sleep apnea (adult) (pediatric): Secondary | ICD-10-CM

## 2018-12-12 MED ORDER — AMBULATORY NON FORMULARY MEDICATION: 0 refills | Status: DC

## 2018-12-12 NOTE — Telephone Encounter (Signed)
Insurance does not cover enough insulin based on how much she is taking, so she cannot get a refill until March. I see where it has been documented multiple times that no PA is required, yet insurance is not paying for enough to last her a whole month.   Since we are having such an issue getting the Basaglar, would it be possible to try the Le[place] or [place]. From previous OV note, those were the recommended options. She is open to change as long as it is comparable to the Lantus she was originally on.   Left VM for drug Rep, Daiva Huge [phone]) to see if he can get some samples for Pt.   Pt also states the last refill from pharmacy had some defective pens in the supply. She was advised to contact the manufacturer for refund.

## 2018-12-12 NOTE — Telephone Encounter (Signed)
Received delivery of 4 Basaglar sample pens.  Lot: [id number] CA. Exp 9/20.   Pt advised and will pick up today.

## 2018-12-12 NOTE — Telephone Encounter (Signed)
OK, new order printed.  OK to fax.

## 2018-12-12 NOTE — Telephone Encounter (Signed)
Please call patient and let her know that I did receive her download from her CPAP.  It does look like she was able to use it for greater than 4 hours for 19 days which is great progress just continue to work at it.  The AHI was around 6 so at some point when you feel that you are comfortable with a 10 cm of water pressure I would actually like to increase that up to 11.  If she is okay with that then please let me know and we can send over a new prescription in order to the DME supplier.

## 2018-12-12 NOTE — Telephone Encounter (Signed)
Pt advised. She is OK to go ahead and increase water pressure to 11.

## 2018-12-13 NOTE — Telephone Encounter (Signed)
Order faxed to [place].Maryruth Eve, Lahoma Crocker, CMA

## 2018-12-19 ENCOUNTER — Other Ambulatory Visit: Payer: Self-pay

## 2018-12-19 MED ORDER — INSULIN PEN NEEDLE 31G X 5 MM MISC: 99 refills | Status: DC

## 2019-01-03 NOTE — Telephone Encounter (Signed)
Left message with patient that no PA is required per [employer] but per [facility] insurance is giving them a quality limit of the Basaglar as 150 units daily per day and patient is taking 160 units daily.  The insurance will not let the patient fill again until March per [facility].   I have called and spoke with Luvenia Starch and was transferred to [facility] in [place] department to start a PA.   Ref: [id number]. It is in clinical review at this time. [phone] is the best fax number.

## 2019-01-19 ENCOUNTER — Encounter: Payer: Self-pay | Admitting: Family Medicine

## 2019-01-19 ENCOUNTER — Ambulatory Visit (INDEPENDENT_AMBULATORY_CARE_PROVIDER_SITE_OTHER): Payer: Managed Care, Other (non HMO) | Admitting: Family Medicine

## 2019-01-19 VITALS — BP 132/71 | HR 104 | Ht 61.0 in | Wt 192.0 lb

## 2019-01-19 DIAGNOSIS — I10 Essential (primary) hypertension: Secondary | ICD-10-CM

## 2019-01-19 DIAGNOSIS — E118 Type 2 diabetes mellitus with unspecified complications: Secondary | ICD-10-CM | POA: Diagnosis not present

## 2019-01-19 DIAGNOSIS — G4733 Obstructive sleep apnea (adult) (pediatric): Secondary | ICD-10-CM

## 2019-01-19 LAB — POCT GLYCOSYLATED HEMOGLOBIN (HGB A1C): Hemoglobin A1C: 7.9 % — AB (ref 4.0–5.6)

## 2019-01-19 MED ORDER — AMBULATORY NON FORMULARY MEDICATION: 1.0000 | Freq: Two times a day (BID) | 5 refills | Status: DC

## 2019-01-19 NOTE — Progress Notes (Signed)
Subjective:    CC:   HPI:  Diabetes - no hypoglycemic events. No wounds or sores that are not healing well. No increased thirst or urination. Checking glucose at home. Taking medications as prescribed without any side effects. She has started walking again.    Hypertension- Pt denies chest pain, SOB, dizziness, or heart palpitations.  Taking meds as directed w/o problems.  Denies medication side effects.    F/U OSA - On average has been wearing it for about 7 hours.  She is doing much better with it.    Past medical history, Surgical history, Family history not pertinant except as noted below, Social history, Allergies, and medications have been entered into the medical record, reviewed, and corrections made.   Review of Systems: No fevers, chills, night sweats, weight loss, chest pain, or shortness of breath.   Objective:    General: Well Developed, well nourished, and in no acute distress.  Neuro: Alert and oriented x3, extra-ocular muscles intact, sensation grossly intact.  HEENT: Normocephalic, atraumatic  Skin: Warm and dry, no rashes. Cardiac: Regular rate and rhythm, no murmurs rubs or gallops, no lower extremity edema.  Respiratory: Clear to auscultation bilaterally. Not using accessory muscles, speaking in full sentences.   Impression and Recommendations:    DM- Much improved. A1C is down to 17.0. Great work!! She is down a lb.  Keep up the exercise.    HTN - Well controlled. Continue current regimen. Follow up in  3 months.   OSA - great work. She has been much more consistant with it.

## 2019-01-20 LAB — BASIC METABOLIC PANEL WITH GFR
BUN: 7 mg/dL (ref 7–25)
CO2: 27 mmol/L (ref 20–32)
Calcium: 9.2 mg/dL (ref 8.6–10.4)
Chloride: 106 mmol/L (ref 98–110)
Creat: 0.62 mg/dL (ref 0.50–1.05)
GFR, Est African American: 119 mL/min/{1.73_m2} (ref >=60)
GFR, Est Non African American: 103 mL/min/{1.73_m2} (ref >=60)
GLUCOSE: 132 mg/dL — AB (ref 65–99)
Potassium: 4 mmol/L (ref 3.5–5.3)
Sodium: 142 mmol/L (ref 135–146)

## 2019-01-20 NOTE — Progress Notes (Signed)
All labs are normal. 

## 2019-04-03 ENCOUNTER — Encounter (HOSPITAL_BASED_OUTPATIENT_CLINIC_OR_DEPARTMENT_OTHER): Payer: Self-pay | Admitting: *Deleted

## 2019-04-03 ENCOUNTER — Other Ambulatory Visit: Payer: Self-pay

## 2019-04-03 ENCOUNTER — Emergency Department (HOSPITAL_BASED_OUTPATIENT_CLINIC_OR_DEPARTMENT_OTHER): Payer: Managed Care, Other (non HMO)

## 2019-04-03 ENCOUNTER — Emergency Department (HOSPITAL_BASED_OUTPATIENT_CLINIC_OR_DEPARTMENT_OTHER)
Admission: EM | Admit: 2019-04-03 | Discharge: 2019-04-03 | Disposition: A | Discharge status: Home / Self Care | Payer: Managed Care, Other (non HMO) | Attending: Emergency Medicine | Admitting: Emergency Medicine

## 2019-04-03 DIAGNOSIS — Z79899 Other long term (current) drug therapy: Secondary | ICD-10-CM | POA: Insufficient documentation

## 2019-04-03 DIAGNOSIS — T701XXA Sinus barotrauma, initial encounter: Secondary | ICD-10-CM | POA: Insufficient documentation

## 2019-04-03 DIAGNOSIS — R0789 Other chest pain: Secondary | ICD-10-CM | POA: Insufficient documentation

## 2019-04-03 DIAGNOSIS — I10 Essential (primary) hypertension: Secondary | ICD-10-CM | POA: Diagnosis not present

## 2019-04-03 DIAGNOSIS — J45909 Unspecified asthma, uncomplicated: Secondary | ICD-10-CM | POA: Diagnosis not present

## 2019-04-03 DIAGNOSIS — F17210 Nicotine dependence, cigarettes, uncomplicated: Secondary | ICD-10-CM | POA: Insufficient documentation

## 2019-04-03 DIAGNOSIS — Z794 Long term (current) use of insulin: Secondary | ICD-10-CM | POA: Diagnosis not present

## 2019-04-03 DIAGNOSIS — E119 Type 2 diabetes mellitus without complications: Secondary | ICD-10-CM | POA: Diagnosis not present

## 2019-04-03 DIAGNOSIS — R002 Palpitations: Secondary | ICD-10-CM | POA: Diagnosis not present

## 2019-04-03 DIAGNOSIS — I493 Ventricular premature depolarization: Secondary | ICD-10-CM | POA: Diagnosis not present

## 2019-04-03 DIAGNOSIS — J3489 Other specified disorders of nose and nasal sinuses: Secondary | ICD-10-CM

## 2019-04-03 LAB — BASIC METABOLIC PANEL
Anion gap: 8 (ref 5–15)
BUN: 8 mg/dL (ref 6–20)
CO2: 26 mmol/L (ref 22–32)
Calcium: 9.7 mg/dL (ref 8.9–10.3)
Chloride: 106 mmol/L (ref 98–111)
Creatinine, Ser: 0.59 mg/dL (ref 0.44–1.00)
GFR calc Af Amer: >60 mL/min (ref >60)
GFR calc non Af Amer: >60 mL/min (ref >60)
Glucose, Bld: 160 mg/dL — ABNORMAL HIGH (ref 70–99)
Potassium: 3.7 mmol/L (ref 3.5–5.1)
Sodium: 140 mmol/L (ref 135–145)

## 2019-04-03 LAB — CBC WITH DIFFERENTIAL/PLATELET
Abs Immature Granulocytes: 0.05 10*3/uL (ref 0.00–0.07)
Basophils Absolute: 0.1 10*3/uL (ref 0.0–0.1)
Basophils Relative: 1 %
Eosinophils Absolute: 0.2 10*3/uL (ref 0.0–0.5)
Eosinophils Relative: 2 %
HCT: 45.9 % (ref 36.0–46.0)
Hemoglobin: 14.2 g/dL (ref 12.0–15.0)
Immature Granulocytes: 0 %
Lymphocytes Relative: 36 %
Lymphs Abs: 4.3 10*3/uL — ABNORMAL HIGH (ref 0.7–4.0)
MCH: 27.4 pg (ref 26.0–34.0)
MCHC: 30.9 g/dL (ref 30.0–36.0)
MCV: 88.4 fL (ref 80.0–100.0)
Monocytes Absolute: 0.6 10*3/uL (ref 0.1–1.0)
Monocytes Relative: 5 %
Neutro Abs: 6.8 10*3/uL (ref 1.7–7.7)
Neutrophils Relative %: 56 %
Platelets: 257 10*3/uL (ref 150–400)
RBC: 5.19 MIL/uL — ABNORMAL HIGH (ref 3.87–5.11)
RDW: 13.2 % (ref 11.5–15.5)
WBC: 12 10*3/uL — ABNORMAL HIGH (ref 4.0–10.5)
nRBC: 0 % (ref 0.0–0.2)

## 2019-04-03 LAB — TROPONIN I: Troponin I: <0.03 ng/mL (ref <0.03)

## 2019-04-03 LAB — D-DIMER, QUANTITATIVE: D-Dimer, Quant: <0.27 ug/mL-FEU (ref 0.00–0.50)

## 2019-04-03 MED ORDER — HYDROCODONE-ACETAMINOPHEN 5-325 MG PO TABS
1.0000 | ORAL_TABLET | Freq: Once | ORAL | Status: AC
  Administered 2019-04-03: 03:00:00 1 via ORAL
  Filled 2019-04-03: qty 1

## 2019-04-03 NOTE — ED Notes (Signed)
Patient transported to X-ray 

## 2019-04-03 NOTE — ED Triage Notes (Addendum)
C/o palpitations for the past week, but chest pain today. Describes as a heaviness. States she took 2 tums without relief. Denies any sob. C/o nausea. Denies any heart history. C/o sinus "pressure" this week that she has been taking mucinex for.

## 2019-04-03 NOTE — ED Provider Notes (Signed)
[facility] EMERGENCY DEPARTMENT Provider Note   CSN: [id number] Arrival date & time: 04/03/19  0229    History   Chief Complaint Chief Complaint  Patient presents with  . palpatations-chest pressure    HPI Terri Zimmerman is a 53 y.o. female.     HPI  This is a 53 year old female with a history of hypertension, diabetes who presents with palpitations and chest pain.  Patient reports 1 week history of palpitations.  However, tonight she woke up with chest pain.  She has not previously had chest pain.  She describes it as a heaviness in her chest.  She rates her pain at 7 out of 10.  She took 2 Tums with no relief.  She denies shortness of breath.  She has had some nausea and belching.  She states that she has had headache and sinus pressure for 1 week for which she took Mucinex.  She denies any fevers or neck stiffness.  She denies any cough or upper respiratory symptoms otherwise.  Denies lower extremity swelling or history of blood clots.  Patient reports she had a similar episode last year and was worked up with a stress test which was negative.  Denies New medications or alcohol or drug use.  Past Medical History:  Diagnosis Date  . Asthma 04-25-07  . Diabetes mellitus 11-12-06   type 2  . Hypertension 11-04-06    Patient Active Problem List   Diagnosis Date Noted  . OSA (obstructive sleep apnea) 09/02/2018  . NAFL (nonalcoholic fatty liver) 40/98/1191  . De Quervain's tenosynovitis, left 02/20/2016  . Patchy loss of hair 03/02/2015  . Tobacco abuse 05/25/2014  . AMENORRHEA 04/17/2010  . Hereditary and idiopathic peripheral neuropathy 10/22/2009  . Essential hypertension 11/29/2008  . PALPITATIONS, OCCASIONAL 04/12/2008  . Diabetes mellitus type 2 with complications ([place]) 47/82/9562  . VITAMIN D DEFICIENCY 01/10/2008  . ALLERGIC RHINITIS 01/10/2008  . ASTHMA, UNSPECIFIED, UNSPECIFIED STATUS 01/10/2008    Past Surgical History:  Procedure Laterality Date  .  ABDOMINAL HYSTERECTOMY  12-11   Complete  . APPENDECTOMY  9-00  . BREAST BIOPSY Left   . BREAST CYST EXCISION Left   . BREAST SURGERY  4-00   of cyst  . FOOT TENDON SURGERY  05/16/2012   bone spur.    [facility] LAPAROSCOPY  4-08   for cyst and endometriosis  . left breast bx  12-11     OB History   No obstetric history on file.      Home Medications    Prior to Admission medications   Medication Sig Start Date End Date Taking? Authorizing Provider  albuterol (PROVENTIL HFA;VENTOLIN HFA) 108 (90 Base) MCG/ACT inhaler Inhale 2 puffs into the lungs every 6 (six) hours as needed for wheezing or shortness of breath. 09/20/18   Hali Marry, MD  AMBULATORY NON FORMULARY MEDICATION One Touch Ultra Test Strips.  Diagnosis: DM  Testing 2 times a day 11/01/12   Hali Marry, MD  AMBULATORY NON FORMULARY MEDICATION Medication Name: Please set CPAP to 11 cm of water pressure.  Please fax download in 2 weeks on new pressure setting.  Please fax order to [facility] (fax: [phone]) 12/12/18   Hali Marry, MD  AMBULATORY NON FORMULARY MEDICATION 1 each by Other route 2 (two) times daily. Lancets for daily glucose checks Diagnosis: E11.9 Testing 2 times a day 01/19/19   Hali Marry, MD  glucose blood (ONE TOUCH ULTRA TEST) test strip USE TO TEST  BLOOD SUGAR DAILY 11/03/17   Hali Marry, MD  Insulin Glargine (BASAGLAR KWIKPEN) 100 UNIT/ML SOPN Inject 0.8 mLs (80 Units total) into the skin 2 (two) times daily. Send home with patient when discharged. Do NOT mix with other insulins. 11/23/18   Hali Marry, MD  Insulin Pen Needle 31G X 5 MM MISC Use to inject insulin up to three (3) times daily. Diagnosis DM E11.9 12/19/18   Hali Marry, MD  KOMBIGLYZE XR 03-999 MG TB24 TAKE 1 TABLET BY MOUTH EVERY DAY 11/28/18   Hali Marry, MD  lisinopril (PRINIVIL,ZESTRIL) 20 MG tablet TAKE 1 TABLET BY MOUTH EVERY DAY 09/28/18   Hali Marry, MD   naproxen sodium (ANAPROX) 220 MG tablet Take 220 mg by mouth 2 (two) times daily with a meal. Patient used this medication for pain.    [provider]  pravastatin (PRAVACHOL) 20 MG tablet Take 1 tablet (20 mg total) by mouth daily. 11/30/18   Hali Marry, MD    Family History Family History  Problem Relation Age of Onset  . Other Mother        RA and CHF  . Diabetes Father   . Diabetes Other   . Heart disease Other     Social History Social History   Tobacco Use  . Smoking status: Current Every Day Smoker    Packs/day: 0.50    Types: Cigarettes  . Smokeless tobacco: Never Used  Substance Use Topics  . Alcohol use: No  . Drug use: No     Allergies   Trulicity [dulaglutide]; Farxiga [dapagliflozin]; Invokana [canagliflozin]; Jentadueto [linagliptin-metformin hcl er]; Lipitor [atorvastatin]; and Victoza [liraglutide]   Review of Systems Review of Systems  Constitutional: Negative for fever.  HENT: Positive for sinus pressure.   Respiratory: Negative for cough and shortness of breath.   Cardiovascular: Positive for chest pain and palpitations. Negative for leg swelling.  Gastrointestinal: Positive for nausea. Negative for abdominal pain.  Genitourinary: Negative for dysuria.  Musculoskeletal: Negative for back pain.  Skin: Negative for rash.  Neurological: Positive for headaches.  All other systems reviewed and are negative.    Physical Exam Updated Vital Signs BP 126/73   Pulse (!) 42   Temp 98.9 F (37.2 C) (Oral)   Resp 13   Ht 1.549 m (5\' 1" )   Wt 86.2 kg   SpO2 97%   BMI 35.90 kg/m   Physical Exam Vitals signs and nursing note reviewed.  Constitutional:      Appearance: She is well-developed. She is obese.  HENT:     Head: Normocephalic and atraumatic.     Right Ear: Tympanic membrane and ear canal normal.     Left Ear: Tympanic membrane and ear canal normal.     Mouth/Throat:     Mouth: Mucous membranes are moist.  Eyes:      Pupils: Pupils are equal, round, and reactive to light.  Neck:     Musculoskeletal: Normal range of motion and neck supple.  Cardiovascular:     Rate and Rhythm: Normal rate and regular rhythm.     Heart sounds: Normal heart sounds.  Pulmonary:     Effort: Pulmonary effort is normal. No respiratory distress.     Breath sounds: No wheezing.  Abdominal:     General: Bowel sounds are normal.     Palpations: Abdomen is soft.  Musculoskeletal:        General: No tenderness.     Right lower leg:  No edema.     Left lower leg: No edema.  Skin:    General: Skin is warm and dry.  Neurological:     Mental Status: She is alert and oriented to person, place, and time.  Psychiatric:        Mood and Affect: Mood normal.      ED Treatments / Results  Labs (all labs ordered are listed, but only abnormal results are displayed) Labs Reviewed  CBC WITH DIFFERENTIAL/PLATELET - Abnormal; Notable for the following components:      Result Value   WBC 12.0 (*)    RBC 5.19 (*)    Lymphs Abs 4.3 (*)    All other components within normal limits  BASIC METABOLIC PANEL - Abnormal; Notable for the following components:   Glucose, Bld 160 (*)    All other components within normal limits  TROPONIN I  D-DIMER, QUANTITATIVE (NOT AT [facility])    EKG EKG Interpretation  Date/Time:  Monday Apr 03 2019 02:37:16 EDT Ventricular Rate:  96 PR Interval:    QRS Duration: 98 QT Interval:  356 QTC Calculation: 450 R Axis:   -69 Text Interpretation:  Sinus rhythm LAD, consider LAFB or inferior infarct Anterior infarct, old No significant change since last tracing Confirmed by Thayer Jew [phone]) on 04/03/2019 2:41:02 AM   Radiology Dg Chest 2 View  Result Date: 04/03/2019 CLINICAL DATA:  53 year old female with chest pain. EXAM: CHEST - 2 VIEW COMPARISON:  10/02/2017 and earlier. FINDINGS: Lung volumes and mediastinal contours remain normal. Visualized tracheal air column is within normal limits. Both  lungs appear clear. No pneumothorax or pleural effusion. Negative visible bowel gas pattern. No acute osseous abnormality identified. IMPRESSION: Negative.  No acute cardiopulmonary abnormality. Electronically Signed   By: Genevie Ann M.D.   On: 04/03/2019 03:53    Procedures Procedures (including critical care time)  Medications Ordered in ED Medications  HYDROcodone-acetaminophen (NORCO/VICODIN) 5-325 MG per tablet 1 tablet (1 tablet Oral Given 04/03/19 0325)     Initial Impression / Assessment and Plan / ED Course  I have reviewed the triage vital signs and the nursing notes.  Pertinent labs & imaging results that were available during my care of the patient were reviewed by me and considered in my medical decision making (see chart for details).  Clinical Course as of Apr 02 429  Mon Apr 03, 2019  0416 Patient updated the bedside given her work-up.  Noted to have bigeminy on the monitor.  This could be the cause of her palpitations.   [CH]    Clinical Course User Index [CH] Emiline Mancebo, Barbette Hair, MD       Patient presents with 1 week history of palpitations.  Tonight had onset of chest pain.  She is overall nontoxic-appearing and vital signs are reassuring.  She does have some risk factors for ACS including diabetes and hypertension.  Heart score is 3.  I have reviewed her chart.  She had a negative stress test at [facility] in 2018.  EKG here shows no evidence of acute ischemia or arrhythmia.  Chest x-ray shows no evidence of pneumothorax or pneumonia.  D-dimer is negative.  She has a slight leukocytosis but otherwise her lab work-up is largely unremarkable including troponin.  She could have a mild upper respiratory infection causing her head pressure and symptoms.  On reevaluation, she appears to be in bigeminy on the monitor.  Discussed with her decreasing her caffeine intake and monitoring her symptoms closely.  Follow-up with her primary physician and cardiology for possible  Holter monitoring.  Patient stated understanding.  After history, exam, and medical workup I feel the patient has been appropriately medically screened and is safe for discharge home. Pertinent diagnoses were discussed with the patient. Patient was given return precautions.   Final Clinical Impressions(s) / ED Diagnoses   Final diagnoses:  Palpitations  Atypical chest pain  Sinus pressure  PVC's (premature ventricular contractions)    ED Discharge Orders    None       Elmina Hendel, Barbette Hair, MD 04/03/19 [phone]

## 2019-04-03 NOTE — Discharge Instructions (Addendum)
You were seen today for palpitations, chest pain, head pressure.  Your work-up is largely reassuring.  You were noted to have frequent PVCs.  Monitor your caffeine intake and try to reduce.  Follow-up closely with your primary physician with referral to your primary cardiologist.  If you have any new or worsening symptoms you should be reevaluated.

## 2019-04-11 ENCOUNTER — Telehealth: Payer: Self-pay

## 2019-04-11 DIAGNOSIS — I10 Essential (primary) hypertension: Secondary | ICD-10-CM

## 2019-04-11 DIAGNOSIS — Z87898 Personal history of other specified conditions: Secondary | ICD-10-CM

## 2019-04-11 DIAGNOSIS — E118 Type 2 diabetes mellitus with unspecified complications: Secondary | ICD-10-CM

## 2019-04-11 DIAGNOSIS — Z8709 Personal history of other diseases of the respiratory system: Secondary | ICD-10-CM

## 2019-04-11 NOTE — Telephone Encounter (Signed)
Pt advised.

## 2019-04-11 NOTE — Telephone Encounter (Signed)
Leeann called and states she would like to have her 3 month labs done downstairs with [facility]. She would also like a COVID-19 antibody test. Please advise.

## 2019-04-11 NOTE — Telephone Encounter (Signed)
We can order the antibody test but she has to understand that it is not currently FDA approved and so as far as reliability is concerned it may not be accurate.  So as long as she understands that if she gets a negative test it may not really be negative and if she gets a positive test that may not truly be positive to COVID-19 specifically than that is fine.  Lab order placed.

## 2019-04-18 ENCOUNTER — Ambulatory Visit (INDEPENDENT_AMBULATORY_CARE_PROVIDER_SITE_OTHER): Payer: Managed Care, Other (non HMO) | Admitting: Sports Medicine

## 2019-04-18 ENCOUNTER — Other Ambulatory Visit: Payer: Self-pay

## 2019-04-18 ENCOUNTER — Ambulatory Visit (INDEPENDENT_AMBULATORY_CARE_PROVIDER_SITE_OTHER): Payer: Managed Care, Other (non HMO)

## 2019-04-18 ENCOUNTER — Encounter: Payer: Self-pay | Admitting: Sports Medicine

## 2019-04-18 VITALS — BP 115/77 | HR 94 | Ht 61.0 in | Wt 189.0 lb

## 2019-04-18 DIAGNOSIS — R1031 Right lower quadrant pain: Secondary | ICD-10-CM | POA: Diagnosis not present

## 2019-04-18 DIAGNOSIS — R311 Benign essential microscopic hematuria: Secondary | ICD-10-CM | POA: Diagnosis not present

## 2019-04-18 DIAGNOSIS — M1611 Unilateral primary osteoarthritis, right hip: Secondary | ICD-10-CM | POA: Diagnosis not present

## 2019-04-18 DIAGNOSIS — R109 Unspecified abdominal pain: Secondary | ICD-10-CM | POA: Diagnosis not present

## 2019-04-18 DIAGNOSIS — R319 Hematuria, unspecified: Secondary | ICD-10-CM | POA: Insufficient documentation

## 2019-04-18 LAB — POCT URINALYSIS DIPSTICK
Bilirubin, UA: NEGATIVE
Glucose, UA: NEGATIVE
Ketones, UA: NEGATIVE
Leukocytes, UA: NEGATIVE
Nitrite, UA: NEGATIVE
Protein, UA: NEGATIVE
Spec Grav, UA: 1.015 (ref 1.010–1.025)
Urobilinogen, UA: 0.2 E.U./dL
pH, UA: 7 (ref 5.0–8.0)

## 2019-04-18 MED ORDER — MELOXICAM 15 MG PO TABS: ORAL_TABLET | ORAL | 3 refills | Status: DC

## 2019-04-18 MED ORDER — CEPHALEXIN 500 MG PO CAPS: 500.0000 mg | ORAL_CAPSULE | Freq: Two times a day (BID) | ORAL | 0 refills | Status: DC

## 2019-04-18 NOTE — Addendum Note (Signed)
Addended by: Silverio Decamp on: 04/18/2019 12:05 PM   Modules accepted: Orders

## 2019-04-18 NOTE — Assessment & Plan Note (Signed)
No visible nephroliths, there is arthritis of the hips, I am going to add meloxicam, when she comes back we will retest her urine and inject her hip joint if still having discomfort.

## 2019-04-18 NOTE — Assessment & Plan Note (Signed)
With right lower quadrant and suprapubic pain, right low back pain but no flank pain or costovertebral angle pain. Adding Keflex, urine culture. X-rays of the hip and KUB. Return to see me in 2 weeks, we will further investigate her hip and/or lumbar spine if no better. We would need a repeat urinalysis to ensure clearance, if persistent hematuria in 2 weeks we will also proceed with urinary tract ultrasound.

## 2019-04-18 NOTE — Progress Notes (Addendum)
Subjective:    CC: Lower abdominal pain  HPI: This is a pleasant 53 year old female with a history of diabetes, for the past several days she has had pain in her right-sided low back, radiation to the right sided lower abdomen, no hematuria grossly, no urgency, frequency, no fevers, chills, pain is really not positional, maybe a little bit worse when she lays down.  I reviewed the past medical history, family history, social history, surgical history, and allergies today and no changes were needed.  Please see the problem list section below in epic for further details.  Past Medical History: Past Medical History:  Diagnosis Date  . Asthma 04-25-07  . Diabetes mellitus 11-12-06   type 2  . Hypertension 11-04-06   Past Surgical History: Past Surgical History:  Procedure Laterality Date  . ABDOMINAL HYSTERECTOMY  12-11   Complete  . APPENDECTOMY  9-00  . BREAST BIOPSY Left   . BREAST CYST EXCISION Left   . BREAST SURGERY  4-00   of cyst  . FOOT TENDON SURGERY  05/16/2012   bone spur.    [facility] Kitchen LAPAROSCOPY  4-08   for cyst and endometriosis  . left breast bx  12-11   Social History: Social History   Socioeconomic History  . Marital status: Married    Spouse name: Not on file  . Number of children: Not on file  . Years of education: Not on file  . Highest education level: Not on file  Occupational History  . Not on file  Social Needs  . Financial resource strain: Not on file  . Food insecurity:    Worry: Not on file    Inability: Not on file  . Transportation needs:    Medical: Not on file    Non-medical: Not on file  Tobacco Use  . Smoking status: Current Every Day Smoker    Packs/day: 0.50    Types: Cigarettes  . Smokeless tobacco: Never Used  Substance and Sexual Activity  . Alcohol use: No  . Drug use: No  . Sexual activity: Not on file  Lifestyle  . Physical activity:    Days per week: Not on file    Minutes per session: Not on file  . Stress: Not on file   Relationships  . Social connections:    Talks on phone: Not on file    Gets together: Not on file    Attends religious service: Not on file    Active member of club or organization: Not on file    Attends meetings of clubs or organizations: Not on file    Relationship status: Not on file  Other Topics Concern  . Not on file  Social History Narrative  . Not on file   Family History: Family History  Problem Relation Age of Onset  . Other Mother        RA and CHF  . Diabetes Father   . Diabetes Other   . Heart disease Other    Allergies: Allergies  Allergen Reactions  . Trulicity [Dulaglutide] Nausea Only    Abdominal pain,  . Farxiga [Dapagliflozin]     foggy  . Invokana [Canagliflozin] Other (See Comments)    Back pain  . Jentadueto [Linagliptin-Metformin Hcl Er]     Abdominal pains and diarrhea  . Lipitor [Atorvastatin]     Sudden hair loss 2 weeks after starting lipitor.   Ahava Bernard [Liraglutide] Other (See Comments)    Abdominal pain and nausea.    Medications:  See med rec.  Review of Systems: No fevers, chills, night sweats, weight loss, chest pain, or shortness of breath.   Objective:    General: Well Developed, well nourished, and in no acute distress.  Neuro: Alert and oriented x3, extra-ocular muscles intact, sensation grossly intact.  HEENT: Normocephalic, atraumatic, pupils equal round reactive to light, neck supple, no masses, no lymphadenopathy, thyroid nonpalpable.  Skin: Warm and dry, no rashes. Cardiac: Regular rate and rhythm, no murmurs rubs or gallops, no lower extremity edema.  Respiratory: Clear to auscultation bilaterally. Not using accessory muscles, speaking in full sentences. Abdomen: Soft, tender to palpation in the right lower quadrant, nondistended, normal bowel sounds, no palpable masses, no guarding, rigidity, rebound tenderness, no costovertebral angle pain. Right hip: ROM IR: 60 Deg, ER: 60 Deg, Flexion: 120 Deg, Extension: 100 Deg,  Abduction: 45 Deg, Adduction: 45 Deg Strength IR: 5/5, ER: 5/5, Flexion: 5/5, Extension: 5/5, Abduction: 5/5, Adduction: 5/5 Pelvic alignment unremarkable to inspection and palpation. Standing hip rotation and gait without trendelenburg / unsteadiness. Greater trochanter without tenderness to palpation. No tenderness over piriformis. No SI joint tenderness and normal minimal SI movement.  Urinalysis positive for blood.  Impression and Recommendations:    Hematuria With right lower quadrant and suprapubic pain, right low back pain but no flank pain or costovertebral angle pain. Adding Keflex, urine culture. X-rays of the hip and KUB. Return to see me in 2 weeks, we will further investigate her hip and/or lumbar spine if no better. We would need a repeat urinalysis to ensure clearance, if persistent hematuria in 2 weeks we will also proceed with urinary tract ultrasound.  Primary osteoarthritis of right hip No visible nephroliths, there is arthritis of the hips, I am going to add meloxicam, when she comes back we will retest her urine and inject her hip joint if still having discomfort.   ___________________________________________ Gwen Her. Dianah Field, M.D., ABFM., CAQSM. Primary Care and Sports Medicine [facility]  Adjunct Professor of [place] [employer] [employer]

## 2019-04-19 LAB — COMPLETE METABOLIC PANEL WITH GFR
AG Ratio: 1.7 (calc) (ref 1.0–2.5)
ALT: 16 U/L (ref 6–29)
AST: 16 U/L (ref 10–35)
Albumin: 4.1 g/dL (ref 3.6–5.1)
Alkaline phosphatase (APISO): 89 U/L (ref 37–153)
BUN: 8 mg/dL (ref 7–25)
CO2: 26 mmol/L (ref 20–32)
Calcium: 9.6 mg/dL (ref 8.6–10.4)
Chloride: 106 mmol/L (ref 98–110)
Creat: 0.6 mg/dL (ref 0.50–1.05)
GFR, Est African American: 121 mL/min/{1.73_m2} (ref >=60)
GFR, Est Non African American: 104 mL/min/{1.73_m2} (ref >=60)
Globulin: 2.4 g/dL (calc) (ref 1.9–3.7)
Glucose, Bld: 140 mg/dL — ABNORMAL HIGH (ref 65–99)
Potassium: 4.1 mmol/L (ref 3.5–5.3)
Sodium: 141 mmol/L (ref 135–146)
Total Bilirubin: 0.4 mg/dL (ref 0.2–1.2)
Total Protein: 6.5 g/dL (ref 6.1–8.1)

## 2019-04-19 LAB — CBC
HCT: 44.7 % (ref 35.0–45.0)
Hemoglobin: 14.5 g/dL (ref 11.7–15.5)
MCH: 27.8 pg (ref 27.0–33.0)
MCHC: 32.4 g/dL (ref 32.0–36.0)
MCV: 85.6 fL (ref 80.0–100.0)
MPV: 10.5 fL (ref 7.5–12.5)
Platelets: 275 10*3/uL (ref 140–400)
RBC: 5.22 10*6/uL — ABNORMAL HIGH (ref 3.80–5.10)
RDW: 13.2 % (ref 11.0–15.0)
WBC: 10.6 10*3/uL (ref 3.8–10.8)

## 2019-04-19 LAB — LIPID PANEL
Cholesterol: 134 mg/dL (ref <200)
HDL: 38 mg/dL — ABNORMAL LOW (ref >=50)
LDL Cholesterol (Calc): 77 mg/dL (calc)
Non-HDL Cholesterol (Calc): 96 mg/dL (calc) (ref <130)
Total CHOL/HDL Ratio: 3.5 (calc) (ref <5.0)
Triglycerides: 102 mg/dL (ref <150)

## 2019-04-19 LAB — SAR COV2 SEROLOGY (COVID19)AB(IGG),IA: SARS CoV2 AB IGG: NEGATIVE

## 2019-04-20 LAB — URINE CULTURE
MICRO NUMBER:: 528625
Result:: NO GROWTH
SPECIMEN QUALITY:: ADEQUATE

## 2019-04-25 ENCOUNTER — Encounter: Payer: Self-pay | Admitting: Family Medicine

## 2019-04-25 ENCOUNTER — Ambulatory Visit (INDEPENDENT_AMBULATORY_CARE_PROVIDER_SITE_OTHER): Payer: Managed Care, Other (non HMO) | Admitting: Family Medicine

## 2019-04-25 VITALS — BP 126/90 | HR 89 | Ht 61.0 in | Wt 189.0 lb

## 2019-04-25 DIAGNOSIS — I10 Essential (primary) hypertension: Secondary | ICD-10-CM | POA: Diagnosis not present

## 2019-04-25 DIAGNOSIS — Z1211 Encounter for screening for malignant neoplasm of colon: Secondary | ICD-10-CM | POA: Diagnosis not present

## 2019-04-25 DIAGNOSIS — I493 Ventricular premature depolarization: Secondary | ICD-10-CM

## 2019-04-25 DIAGNOSIS — E118 Type 2 diabetes mellitus with unspecified complications: Secondary | ICD-10-CM

## 2019-04-25 MED ORDER — METOPROLOL SUCCINATE ER 25 MG PO TB24: 25.0000 mg | ORAL_TABLET | Freq: Every day | ORAL | 1 refills | Status: DC

## 2019-04-25 NOTE — Progress Notes (Signed)
Virtual Visit via Video Note  I connected with Terri Zimmerman on 04/25/19 at  8:30 AM EDT by a video enabled telemedicine application and verified that I am speaking with the correct person using two identifiers.   I discussed the limitations of evaluation and management by telemedicine and the availability of in person appointments. The patient expressed understanding and agreed to proceed.  Pt was at home and I was in my office for the virtual visit.     Subjective:    CC: 3 mo f/u DM  HPI:  Diabetes - no hypoglycemic events. No wounds or sores that are not healing well. No increased thirst or urination. Checking glucose at home. Taking medications as prescribed without any side effects. She has been gardening. BS:167  Hypertension- Pt denies chest pain, SOB, dizziness, or heart palpitations.  Taking meds as directed w/o problems.  Denies medication side effects.    She was also seen by 1 of my partners on June 2 she was having some right lower quadrant and suprapubic pain at the time.  He started her on Keflex and ordered a urine culture.  The urine culture was negative. She is  feeling some better in that regard.  She does have some OA in her hip and her back seen on xrays.  She was prescribed Mobic but has been taking Tylenol instead. I advised her that if the Tylenol is working well for her then she should continue to take this for her pain.  Pt feels that the Pravastatin makes her "feel funny" she feels that it makes her heart race. She has cut back on her caffeine, chocolate and smoking and feels that this has helped some. She was seen at the ED a few weeks ago for this and was told she had PVC's. She cut back on her smoking as well    She has been furloughed from work during [employer].  Past medical history, Surgical history, Family history not pertinant except as noted below, Social history, Allergies, and medications have been entered into the medical record, reviewed, and corrections  made.   Review of Systems: No fevers, chills, night sweats, weight loss, chest pain, or shortness of breath.   Objective:    General: Speaking clearly in complete sentences without any shortness of breath.  Alert and oriented x3.  Normal judgment. No apparent acute distress. Well groomed.     Impression and Recommendations:    DM - due for A1C this summer. Can go at her convenience.    HTN -  Well controlled. Continue current regimen. Follow up in  4 months.   Colon cancer screening - Ok with doing the cologuard. Order placed.     PVCs - diagnosed in ED when seen for palpitations. Her sxs have improved with removing caffeine from her diet but they are not completely gone.  She would like to discuss possible treatment. We discussed adding a betablocker. Will have her cut her lisinopril in half and start low dose metoprolol at night. Can cut that in half if BP drops too low or pulse is too low.       I discussed the assessment and treatment plan with the patient. The patient was provided an opportunity to ask questions and all were answered. The patient agreed with the plan and demonstrated an understanding of the instructions.   The patient was advised to call back or seek an in-person evaluation if the symptoms worsen or if the condition fails to improve  as anticipated.   Beatrice Lecher, MD

## 2019-04-25 NOTE — Progress Notes (Signed)
BS:167 Pt was seen by Dr. Darene Lamer for hip pain and was told she has some mild arthritis. He prescribed Mobic for her and wanted to know what Dr.Metheney thought about her not taking this and just taking Tylenol instead. I advised her that if the Tylenol is working well for her then she should continue to take this for her pain.  Pt feels that the Pravastatin makes her "feel funny" she feels that it makes her heart race. She has cut back on her caffeine, chocolate and smoking and feels that this has helped some. She was seen at the ED a few weeks ago for this and was told  She had PVC's.  It is time for her Cologuard will place order.Elouise Munroe, [place]

## 2019-04-26 ENCOUNTER — Other Ambulatory Visit: Payer: Self-pay | Admitting: Family Medicine

## 2019-05-12 LAB — HM DIABETES EYE EXAM

## 2019-05-15 ENCOUNTER — Other Ambulatory Visit: Payer: Self-pay | Admitting: Family Medicine

## 2019-05-16 ENCOUNTER — Encounter: Payer: Self-pay | Admitting: Family Medicine

## 2019-05-16 ENCOUNTER — Ambulatory Visit (INDEPENDENT_AMBULATORY_CARE_PROVIDER_SITE_OTHER): Payer: Managed Care, Other (non HMO) | Admitting: Family Medicine

## 2019-05-16 VITALS — BP 108/75 | HR 101 | Ht 61.0 in | Wt 189.0 lb

## 2019-05-16 DIAGNOSIS — I10 Essential (primary) hypertension: Secondary | ICD-10-CM

## 2019-05-16 DIAGNOSIS — I493 Ventricular premature depolarization: Secondary | ICD-10-CM

## 2019-05-16 MED ORDER — METOPROLOL SUCCINATE ER 25 MG PO TB24: 25.0000 mg | ORAL_TABLET | Freq: Every day | ORAL | 0 refills | Status: DC

## 2019-05-16 NOTE — Progress Notes (Signed)
Virtual Visit via Video Note  I connected with [name] on 05/16/19 at  9:30 AM EDT by a video enabled telemedicine application and verified that I am speaking with the correct person using two identifiers.   I discussed the limitations of evaluation and management by telemedicine and the availability of in person appointments. The patient expressed understanding and agreed to proceed.  Pt was at home and I was in my office for the virtual visit.     Subjective:    CC: F/U new start beta-blocker.   HPI: Follow-up hypertension and PVCs-she recently was started on a beta-blocker for the PVCs.  Pt reports that the beta blocker causes her to be really tired and sleepy. But otherwise she hasn't had any events like she was experiencing. She has cut out caffeine.  No change in her pulse and BP has looked great.  She is taking whole tab of the metoprolol.  She has noticing more frequent HA. No sinus sxs.  No other URI sxs currently.   She has been furloughed since May and will actually be starting back at work next week so she is actually pretty excited about this.  She has not received her Cologuard kit yet.  Past medical history, Surgical history, Family history not pertinant except as noted below, Social history, Allergies, and medications have been entered into the medical record, reviewed, and corrections made.   Review of Systems: No fevers, chills, night sweats, weight loss, chest pain, or shortness of breath.   Objective:    General: Speaking clearly in complete sentences without any shortness of breath.  Alert and oriented x3.  Normal judgment. No apparent acute distress. Well groomed.     Impression and Recommendations:    HTN -blood pressure looks fantastic.  Continue to monitor for lows.  Just encouraged her to make sure she is exercising regularly.  PVCs/palpitations-she is getting some improvement on the beta-blocker as well as her discontinuation of caffeine.  She does  feel like it makes her tired and sleepy but wants to wait until she goes back to work to see if maybe she just feels a little bit more stimulated and the little less bored.  We discussed the option of also cutting the pill in half and seeing if it still regulates her palpitations but she feels less fatigue with it.  Or even considering switching to Bystolic or a different beta-blocker.  Colon Cancer screening -  Will re-order Cologuard.      I discussed the assessment and treatment plan with the patient. The patient was provided an opportunity to ask questions and all were answered. The patient agreed with the plan and demonstrated an understanding of the instructions.   The patient was advised to call back or seek an in-person evaluation if the symptoms worsen or if the condition fails to improve as anticipated.   Beatrice Lecher, MD

## 2019-05-16 NOTE — Progress Notes (Signed)
Pt reports that the beta blocker causes her to be really tired and sleepy. But otherwise she hasn't had any events like she was experiencing.Marland KitchenMarland KitchenElouise Zimmerman, [place]

## 2019-06-08 LAB — COLOGUARD: Cologuard: NEGATIVE

## 2019-06-10 ENCOUNTER — Emergency Department (HOSPITAL_BASED_OUTPATIENT_CLINIC_OR_DEPARTMENT_OTHER): Payer: Managed Care, Other (non HMO)

## 2019-06-10 ENCOUNTER — Emergency Department (HOSPITAL_BASED_OUTPATIENT_CLINIC_OR_DEPARTMENT_OTHER)
Admission: EM | Admit: 2019-06-10 | Discharge: 2019-06-10 | Disposition: A | Discharge status: Home / Self Care | Payer: Managed Care, Other (non HMO) | Attending: Emergency Medicine | Admitting: Emergency Medicine

## 2019-06-10 ENCOUNTER — Encounter (HOSPITAL_BASED_OUTPATIENT_CLINIC_OR_DEPARTMENT_OTHER): Payer: Self-pay | Admitting: Emergency Medicine

## 2019-06-10 ENCOUNTER — Other Ambulatory Visit: Payer: Self-pay

## 2019-06-10 DIAGNOSIS — E119 Type 2 diabetes mellitus without complications: Secondary | ICD-10-CM | POA: Diagnosis not present

## 2019-06-10 DIAGNOSIS — F17210 Nicotine dependence, cigarettes, uncomplicated: Secondary | ICD-10-CM | POA: Diagnosis not present

## 2019-06-10 DIAGNOSIS — Z79899 Other long term (current) drug therapy: Secondary | ICD-10-CM | POA: Diagnosis not present

## 2019-06-10 DIAGNOSIS — I10 Essential (primary) hypertension: Secondary | ICD-10-CM | POA: Insufficient documentation

## 2019-06-10 DIAGNOSIS — R1012 Left upper quadrant pain: Secondary | ICD-10-CM | POA: Diagnosis not present

## 2019-06-10 DIAGNOSIS — R1013 Epigastric pain: Secondary | ICD-10-CM | POA: Diagnosis not present

## 2019-06-10 DIAGNOSIS — N63 Unspecified lump in unspecified breast: Secondary | ICD-10-CM

## 2019-06-10 DIAGNOSIS — Z794 Long term (current) use of insulin: Secondary | ICD-10-CM | POA: Diagnosis not present

## 2019-06-10 DIAGNOSIS — K8500 Idiopathic acute pancreatitis without necrosis or infection: Secondary | ICD-10-CM

## 2019-06-10 LAB — COMPREHENSIVE METABOLIC PANEL
ALT: 19 U/L (ref 0–44)
AST: 19 U/L (ref 15–41)
Albumin: 3.9 g/dL (ref 3.5–5.0)
Alkaline Phosphatase: 101 U/L (ref 38–126)
Anion gap: 12 (ref 5–15)
BUN: 7 mg/dL (ref 6–20)
CO2: 26 mmol/L (ref 22–32)
Calcium: 9.2 mg/dL (ref 8.9–10.3)
Chloride: 103 mmol/L (ref 98–111)
Creatinine, Ser: 0.55 mg/dL (ref 0.44–1.00)
GFR calc Af Amer: >60 mL/min (ref >60)
GFR calc non Af Amer: >60 mL/min (ref >60)
Glucose, Bld: 140 mg/dL — ABNORMAL HIGH (ref 70–99)
Potassium: 3.8 mmol/L (ref 3.5–5.1)
Sodium: 141 mmol/L (ref 135–145)
Total Bilirubin: 0.3 mg/dL (ref 0.3–1.2)
Total Protein: 7.1 g/dL (ref 6.5–8.1)

## 2019-06-10 LAB — CBC WITH DIFFERENTIAL/PLATELET
Abs Immature Granulocytes: 0.05 10*3/uL (ref 0.00–0.07)
Basophils Absolute: 0.1 10*3/uL (ref 0.0–0.1)
Basophils Relative: 0 %
Eosinophils Absolute: 0.2 10*3/uL (ref 0.0–0.5)
Eosinophils Relative: 1 %
HCT: 47.5 % — ABNORMAL HIGH (ref 36.0–46.0)
Hemoglobin: 14.9 g/dL (ref 12.0–15.0)
Immature Granulocytes: 0 %
Lymphocytes Relative: 27 %
Lymphs Abs: 3.7 10*3/uL (ref 0.7–4.0)
MCH: 27.7 pg (ref 26.0–34.0)
MCHC: 31.4 g/dL (ref 30.0–36.0)
MCV: 88.3 fL (ref 80.0–100.0)
Monocytes Absolute: 0.6 10*3/uL (ref 0.1–1.0)
Monocytes Relative: 4 %
Neutro Abs: 9.1 10*3/uL — ABNORMAL HIGH (ref 1.7–7.7)
Neutrophils Relative %: 68 %
Platelets: 267 10*3/uL (ref 150–400)
RBC: 5.38 MIL/uL — ABNORMAL HIGH (ref 3.87–5.11)
RDW: 13.2 % (ref 11.5–15.5)
WBC: 13.7 10*3/uL — ABNORMAL HIGH (ref 4.0–10.5)
nRBC: 0 % (ref 0.0–0.2)

## 2019-06-10 LAB — URINALYSIS, ROUTINE W REFLEX MICROSCOPIC
Bilirubin Urine: NEGATIVE
Glucose, UA: NEGATIVE mg/dL
Hgb urine dipstick: NEGATIVE
Ketones, ur: NEGATIVE mg/dL
Leukocytes,Ua: NEGATIVE
Nitrite: NEGATIVE
Protein, ur: NEGATIVE mg/dL
Specific Gravity, Urine: 1.02 (ref 1.005–1.030)
pH: 7 (ref 5.0–8.0)

## 2019-06-10 LAB — LIPASE, BLOOD: Lipase: 136 U/L — ABNORMAL HIGH (ref 11–51)

## 2019-06-10 MED ORDER — SODIUM CHLORIDE 0.9 % IV BOLUS
1000.0000 mL | Freq: Once | INTRAVENOUS | Status: AC
  Administered 2019-06-10: 1000 mL via INTRAVENOUS

## 2019-06-10 MED ORDER — ONDANSETRON HCL 4 MG/2ML IJ SOLN
4.0000 mg | Freq: Once | INTRAMUSCULAR | Status: AC
  Administered 2019-06-10: 4 mg via INTRAVENOUS
  Filled 2019-06-10: qty 2

## 2019-06-10 MED ORDER — SODIUM CHLORIDE 0.9 % IV SOLN: INTRAVENOUS | Status: DC

## 2019-06-10 MED ORDER — ONDANSETRON 4 MG PO TBDP: ORAL_TABLET | ORAL | 0 refills | Status: DC

## 2019-06-10 MED ORDER — HYDROCODONE-ACETAMINOPHEN 5-325 MG PO TABS: 1.0000 | ORAL_TABLET | Freq: Four times a day (QID) | ORAL | 0 refills | Status: DC | PRN

## 2019-06-10 MED ORDER — IOHEXOL 300 MG/ML  SOLN
100.0000 mL | Freq: Once | INTRAMUSCULAR | Status: AC | PRN
  Administered 2019-06-10: 100 mL via INTRAVENOUS

## 2019-06-10 MED ORDER — HYDROMORPHONE HCL 1 MG/ML IJ SOLN
0.5000 mg | INTRAMUSCULAR | Status: DC | PRN
  Administered 2019-06-10 (×2): 0.5 mg via INTRAVENOUS
  Filled 2019-06-10 (×2): qty 1

## 2019-06-10 NOTE — ED Provider Notes (Signed)
  Physical Exam  BP (!) 161/98 (BP Location: Left Arm)   Pulse (!) 108   Temp 98.9 F (37.2 C) (Oral)   Resp 20   Ht 5\' 1"  (1.549 m)   Wt 85.7 kg   SpO2 98%   BMI 35.71 kg/m   Physical Exam  ED Course/Procedures     Procedures  MDM  Care assumed at 3 PM from Dr. Tomi Bamberger.  Patient has been having epigastric pain and nausea for the last 3 days.  Signout pending labs and urinalysis and likely need to CT.  5:28 PM Her lipase is 136.  Her LFTs are normal and her CT did not showed significant pancreatitis.  Clinically however, I think she likely has mild pancreatitis.  She denies using alcohol and there is no gallstones on her CT and her LFTs are normal .  She does have an incidental right breast mass but she states that she already had a mammogram recently and told her to follow-up with breast clinic for that.  She has no vomiting in the ED and pain is controlled with pain medicine.  Will discharge patient home with Vicodin, Zofran as needed.  Told her to eat a bland diet and stay hydrated. Gave strict return precautions        Drenda Freeze, MD 06/10/19 1730

## 2019-06-10 NOTE — ED Triage Notes (Signed)
Pt experiencing right flank pain along with nausea x 3 days

## 2019-06-10 NOTE — ED Notes (Signed)
Patient transported to CT 

## 2019-06-10 NOTE — Discharge Instructions (Addendum)
Stay hydrated   Take zofran for nausea.   Take vicodin for severe pain   You may have an incidental breast mass so please follow up with breast center   Return to ER if you have severe abdominal pain, vomiting, fever, dehydration

## 2019-06-10 NOTE — ED Provider Notes (Signed)
[facility]ERGENCY DEPARTMENT Provider Note   CSN: [id number] Arrival date & time: 06/10/19  1246     History   Chief Complaint Chief Complaint  Patient presents with  . Abdominal Pain    HPI Terri Zimmerman is a 53 y.o. female.     HPI Pt started having abd pain on Thursday.  THe pain is on both sides of her upper abdomen.  It goes to her back and the severe pain in the left flank area.  SHe has been nauseated but  No vomiting .  No diarrhea.  SHe has had bowel movements but they are hard.  Prior appendectomy and hysterectomy.  No fevers or cough.  No dysuria.  Going to the bathroom frequently  Past Medical History:  Diagnosis Date  . Asthma 04-25-07  . Diabetes mellitus 11-12-06   type 2  . Hypertension 11-04-06    Patient Active Problem List   Diagnosis Date Noted  . PVC's (premature ventricular contractions) 04/25/2019  . Hematuria 04/18/2019  . Primary osteoarthritis of right hip 04/18/2019  . OSA (obstructive sleep apnea) 09/02/2018  . NAFL (nonalcoholic fatty liver) 51/70/0174  . De Quervain's tenosynovitis, left 02/20/2016  . Patchy loss of hair 03/02/2015  . Tobacco abuse 05/25/2014  . AMENORRHEA 04/17/2010  . Hereditary and idiopathic peripheral neuropathy 10/22/2009  . Essential hypertension 11/29/2008  . PALPITATIONS, OCCASIONAL 04/12/2008  . Diabetes mellitus type 2 with complications ([place]) 94/49/6759  . VITAMIN D DEFICIENCY 01/10/2008  . ALLERGIC RHINITIS 01/10/2008  . ASTHMA, UNSPECIFIED, UNSPECIFIED STATUS 01/10/2008    Past Surgical History:  Procedure Laterality Date  . ABDOMINAL HYSTERECTOMY  12-11   Complete  . APPENDECTOMY  9-00  . BREAST BIOPSY Left   . BREAST CYST EXCISION Left   . BREAST SURGERY  4-00   of cyst  . FOOT TENDON SURGERY  05/16/2012   bone spur.    Marland Kitchen LAPAROSCOPY  4-08   for cyst and endometriosis  . left breast bx  12-11     OB History   No obstetric history on file.      Home Medications    Prior to  Admission medications   Medication Sig Start Date End Date Taking? Authorizing Provider  acetaminophen (TYLENOL) 325 MG tablet Take 650 mg by mouth 2 (two) times daily.    [provider]  albuterol (PROVENTIL HFA;VENTOLIN HFA) 108 (90 Base) MCG/ACT inhaler Inhale 2 puffs into the lungs every 6 (six) hours as needed for wheezing or shortness of breath. 09/20/18   Hali Marry, MD  AMBULATORY NON FORMULARY MEDICATION One Touch Ultra Test Strips.  Diagnosis: DM  Testing 2 times a day 11/01/12   Hali Marry, MD  AMBULATORY NON FORMULARY MEDICATION Medication Name: Please set CPAP to 11 cm of water pressure.  Please fax download in 2 weeks on new pressure setting.  Please fax order to [facility] (fax: [phone]) 12/12/18   Hali Marry, MD  AMBULATORY NON FORMULARY MEDICATION 1 each by Other route 2 (two) times daily. Lancets for daily glucose checks Diagnosis: E11.9 Testing 2 times a day 01/19/19   Hali Marry, MD  glucose blood (ONE TOUCH ULTRA TEST) test strip USE TO TEST BLOOD SUGAR DAILY 11/03/17   Hali Marry, MD  Insulin Glargine (BASAGLAR KWIKPEN) 100 UNIT/ML SOPN Inject 0.8 mLs (80 Units total) into the skin 2 (two) times daily. Send home with patient when discharged. Do NOT mix with other insulins. 11/23/18   [place],  Rene Kocher, MD  Insulin Pen Needle 31G X 5 MM MISC Use to inject insulin up to three (3) times daily. Diagnosis DM E11.9 12/19/18   Hali Marry, MD  KOMBIGLYZE XR 03-999 MG TB24 TAKE 1 TABLET BY MOUTH EVERY DAY 05/15/19   Hali Marry, MD  lisinopril (ZESTRIL) 20 MG tablet TAKE 1 TABLET BY MOUTH EVERY DAY 04/26/19   Hali Marry, MD  metoprolol succinate (TOPROL-XL) 25 MG 24 hr tablet Take 1 tablet (25 mg total) by mouth at bedtime. 05/16/19   Hali Marry, MD  pravastatin (PRAVACHOL) 20 MG tablet Take 1 tablet (20 mg total) by mouth daily. 11/30/18   Hali Marry, MD    Family History  Family History  Problem Relation Age of Onset  . Other Mother        RA and CHF  . Diabetes Father   . Diabetes Other   . Heart disease Other     Social History Social History   Tobacco Use  . Smoking status: Current Every Day Smoker    Packs/day: 0.50    Types: Cigarettes  . Smokeless tobacco: Never Used  Substance Use Topics  . Alcohol use: No  . Drug use: No     Allergies   Trulicity [dulaglutide], Farxiga [dapagliflozin], Invokana [canagliflozin], Jentadueto [linagliptin-metformin hcl er], Lipitor [atorvastatin], and Victoza [liraglutide]   Review of Systems Review of Systems  All other systems reviewed and are negative.    Physical Exam Updated Vital Signs BP (!) 161/98 (BP Location: Left Arm)   Pulse (!) 108   Temp 98.9 F (37.2 C) (Oral)   Resp 20   Ht 1.549 m (5\' 1" )   Wt 85.7 kg   SpO2 98%   BMI 35.71 kg/m   Physical Exam Vitals signs and nursing note reviewed.  Constitutional:      General: She is not in acute distress.    Appearance: She is well-developed.  HENT:     Head: Normocephalic and atraumatic.     Right Ear: External ear normal.     Left Ear: External ear normal.  Eyes:     General: No scleral icterus.       Right eye: No discharge.        Left eye: No discharge.     Conjunctiva/sclera: Conjunctivae normal.  Neck:     Musculoskeletal: Neck supple.     Trachea: No tracheal deviation.  Cardiovascular:     Rate and Rhythm: Normal rate and regular rhythm.  Pulmonary:     Effort: Pulmonary effort is normal. No respiratory distress.     Breath sounds: Normal breath sounds. No stridor. No wheezing or rales.  Abdominal:     General: Bowel sounds are normal. There is no distension.     Palpations: Abdomen is soft.     Tenderness: There is abdominal tenderness in the epigastric area and left upper quadrant. There is left CVA tenderness. There is no guarding or rebound.  Musculoskeletal:        General: No tenderness.  Skin:     General: Skin is warm and dry.     Findings: No rash.  Neurological:     Mental Status: She is alert.     Cranial Nerves: No cranial nerve deficit (no facial droop, extraocular movements intact, no slurred speech).     Sensory: No sensory deficit.     Motor: No abnormal muscle tone or seizure activity.     Coordination: Coordination normal.  ED Treatments / Results  Labs pending Procedures Procedures (including critical care time)  Medications Ordered in ED Medications - No data to display   Initial Impression / Assessment and Plan / ED Course  I have reviewed the triage vital signs and the nursing notes.  Pertinent labs & imaging results that were available during my care of the patient were reviewed by me and considered in my medical decision making (see chart for details).   Pt presents with upper abdominal and flank pain.  Renal colic a consideration although bilateral nature argues against that.  Pancreatitis, hepatitis, gastritis, colitis, diverticulitis in the differential. Labs ordered.  Consider CT scan based on lab studies.  Care turned over to Dr Darl Householder at change of shift.  Final Clinical Impressions(s) / ED Diagnoses  pending   Dorie Rank, MD 06/11/19 [phone]

## 2019-06-13 ENCOUNTER — Ambulatory Visit (INDEPENDENT_AMBULATORY_CARE_PROVIDER_SITE_OTHER): Payer: Managed Care, Other (non HMO) | Admitting: Family Medicine

## 2019-06-13 ENCOUNTER — Encounter: Payer: Self-pay | Admitting: Family Medicine

## 2019-06-13 ENCOUNTER — Other Ambulatory Visit: Payer: Self-pay

## 2019-06-13 VITALS — BP 124/70 | HR 108 | Ht 61.0 in | Wt 189.0 lb

## 2019-06-13 DIAGNOSIS — E118 Type 2 diabetes mellitus with unspecified complications: Secondary | ICD-10-CM | POA: Diagnosis not present

## 2019-06-13 DIAGNOSIS — M545 Low back pain, unspecified: Secondary | ICD-10-CM

## 2019-06-13 DIAGNOSIS — K859 Acute pancreatitis without necrosis or infection, unspecified: Secondary | ICD-10-CM | POA: Diagnosis not present

## 2019-06-13 DIAGNOSIS — N6310 Unspecified lump in the right breast, unspecified quadrant: Secondary | ICD-10-CM | POA: Diagnosis not present

## 2019-06-13 LAB — POCT GLYCOSYLATED HEMOGLOBIN (HGB A1C): Hemoglobin A1C: 7.5 % — AB (ref 4.0–5.6)

## 2019-06-13 NOTE — Assessment & Plan Note (Signed)
A1c improved down to 7.5 so she has made some great strides she is really been trying to change her diet.  Encouraged her to just continue to work at it and let see if we can get it down into the 6 range.

## 2019-06-13 NOTE — Progress Notes (Signed)
Established Patient Office Visit  Subjective:  Patient ID: Terri Zimmerman, female    DOB: [birth year]  Age: 53 y.o. MRN: [id number]  CC:  Chief Complaint  Patient presents with   Results    HPI Terri Zimmerman presents for right breast mass.  She actually went to the urgent care for abdominal pain 3 days ago for abdominal pain.  She was diagnosed with pancreatitis as she had elevated lipase levels they also did a CT which did not s how any worrisome acute findings but did note a mass in the right breast measuring approximately 2 x 4 cm.  That recommended further work-up.  She was also noted to have an adrenal lesion but evidently this looks stable from previous imaging.      CT ABDOMEN PELVIS W CONTRAST (Accession [id number]) (Order [id number]) Imaging Date: 06/10/2019 Department: [facility] Released By/Authorizing: Drenda Freeze, MD (auto-released)  Exam Information  Status Exam Begun  Exam Ended   Final [99] 06/10/2019 3:53 PM 06/10/2019 4:11 PM  PACS Images  Show images for CT ABDOMEN PELVIS W CONTRAST  Study Result  CLINICAL DATA:  Pt with flank pain radiating into lower abdominal pain; pain feels like it is doing cross wise in her abdomen; pt has no hematuria; no h/o stones  EXAM: CT ABDOMEN AND PELVIS WITH CONTRAST  TECHNIQUE: Multidetector CT imaging of the abdomen and pelvis was performed using the standard protocol following bolus administration of intravenous contrast.  CONTRAST:  1102mL OMNIPAQUE IOHEXOL 300 MG/ML  SOLN  COMPARISON:  CT of the abdomen and pelvis on 12/27/2017 in  FINDINGS: Lower chest: Lung bases are unremarkable. Heart size is normal.  Within the LATERAL portion of the RIGHT breast there is a 4.0 x 2.0 centimeter mass.  Hepatobiliary: Small low-attenuation lesions are identified within the liver, most consistent with cysts. Gallbladder is present and normal in CT appearance.  Pancreas:  Unremarkable. No pancreatic ductal dilatation or surrounding inflammatory changes.  Spleen: Normal in size without focal abnormality.  Adrenals/Urinary Tract: RIGHT adrenal mass is 3.6 x 3.1 centimeters, consistent with adenoma based on previous characterization. Lesion is stable compared to prior studies. Density measurements are indeterminate for adenoma. The LEFT adrenal gland is normal.  RIGHT kidney is unremarkable.  LEFT kidney is unremarkable. Ureters are normal. No hydronephrosis.  The bladder and visualized portion of the urethra are normal.  Stomach/Bowel: The stomach and small bowel loops are normal in appearance. Appendectomy. Colon is unremarkable.  Vascular/Lymphatic: There is mild atherosclerotic calcification of the abdominal aorta. No retroperitoneal or mesenteric adenopathy.  Reproductive: Absent uterus. No adnexal mass.  Other: No ascites. Small fat containing paraumbilical hernia.  Musculoskeletal: Mild degenerative changes at L5-S1 and SI joints.  IMPRESSION: 1. No evidence for acute abnormality of the abdomen or pelvis. 2. RIGHT breast mass measuring 4.0 x 2.0 cm. Further evaluation with diagnostic mammogram and ultrasound is recommended. Stable RIGHT adrenal adenoma. 3. Small fat containing paraumbilical hernia. 4. Status post hysterectomy. 5. aortic Atherosclerosis (ICD10-I70.0).   Electronically Signed   By: Nolon Nations M.D.   On: 06/10/2019 16:38     Past Medical History:  Diagnosis Date   Asthma 04-25-07   Diabetes mellitus 11-12-06   type 2   Hypertension 11-04-06    Past Surgical History:  Procedure Laterality Date   ABDOMINAL HYSTERECTOMY  12-11   Complete   APPENDECTOMY  9-00   BREAST BIOPSY Left    BREAST CYST EXCISION Left  BREAST SURGERY  4-00   of cyst   FOOT TENDON SURGERY  05/16/2012   bone spur.     LAPAROSCOPY  4-08   for cyst and endometriosis   left breast bx  12-11    Family  History  Problem Relation Age of Onset   Other Mother        RA and CHF   Diabetes Father    Diabetes Other    Heart disease Other     Social History   Socioeconomic History   Marital status: Married    Spouse name: Not on file   Number of children: Not on file   Years of education: Not on file   Highest education level: Not on file  Occupational History   Not on file  Social Needs   Financial resource strain: Not on file   Food insecurity    Worry: Not on file    Inability: Not on file   Transportation needs    Medical: Not on file    Non-medical: Not on file  Tobacco Use   Smoking status: Current Every Day Smoker    Packs/day: 0.50    Types: Cigarettes   Smokeless tobacco: Never Used  Substance and Sexual Activity   Alcohol use: No   Drug use: No   Sexual activity: Not on file  Lifestyle   Physical activity    Days per week: Not on file    Minutes per session: Not on file   Stress: Not on file  Relationships   Social connections    Talks on phone: Not on file    Gets together: Not on file    Attends religious service: Not on file    Active member of club or organization: Not on file    Attends meetings of clubs or organizations: Not on file    Relationship status: Not on file   Intimate partner violence    Fear of current or ex partner: Not on file    Emotionally abused: Not on file    Physically abused: Not on file    Forced sexual activity: Not on file  Other Topics Concern   Not on file  Social History Narrative   Not on file    Outpatient Medications Prior to Visit  Medication Sig Dispense Refill   acetaminophen (TYLENOL) 325 MG tablet Take 650 mg by mouth 2 (two) times daily.     albuterol (PROVENTIL HFA;VENTOLIN HFA) 108 (90 Base) MCG/ACT inhaler Inhale 2 puffs into the lungs every 6 (six) hours as needed for wheezing or shortness of breath. 1 Inhaler 0   AMBULATORY NON FORMULARY MEDICATION One Touch Ultra Test Strips.   Diagnosis: DM  Testing 2 times a day 100 each 5   AMBULATORY NON FORMULARY MEDICATION Medication Name: Please set CPAP to 11 cm of water pressure.  Please fax download in 2 weeks on new pressure setting.  Please fax order to [facility] (fax: [phone]) 1 vial 0   AMBULATORY NON FORMULARY MEDICATION 1 each by Other route 2 (two) times daily. Lancets for daily glucose checks Diagnosis: E11.9 Testing 2 times a day 200 each 5   glucose blood (ONE TOUCH ULTRA TEST) test strip USE TO TEST BLOOD SUGAR DAILY 25 each 5   HYDROcodone-acetaminophen (NORCO/VICODIN) 5-325 MG tablet Take 1 tablet by mouth every 6 (six) hours as needed. 12 tablet 0   Insulin Glargine (BASAGLAR KWIKPEN) 100 UNIT/ML SOPN Inject 0.8 mLs (80 Units total) into the skin 2 (  two) times daily. Send home with patient when discharged. Do NOT mix with other insulins. 18 pen PRN   Insulin Pen Needle 31G X 5 MM MISC Use to inject insulin up to three (3) times daily. Diagnosis DM E11.9 100 each prn   KOMBIGLYZE XR 03-999 MG TB24 TAKE 1 TABLET BY MOUTH EVERY DAY 90 tablet 1   lisinopril (ZESTRIL) 20 MG tablet TAKE 1 TABLET BY MOUTH EVERY DAY 90 tablet 1   metoprolol succinate (TOPROL-XL) 25 MG 24 hr tablet Take 1 tablet (25 mg total) by mouth at bedtime. 90 tablet 0   ondansetron (ZOFRAN ODT) 4 MG disintegrating tablet 4mg  ODT q4 hours prn nausea/vomit 10 tablet 0   pravastatin (PRAVACHOL) 20 MG tablet Take 1 tablet (20 mg total) by mouth daily. 90 tablet 3   No facility-administered medications prior to visit.     Allergies  Allergen Reactions   Trulicity [Dulaglutide] Nausea Only    Abdominal pain,   Farxiga [Dapagliflozin]     foggy   Invokana [Canagliflozin] Other (See Comments)    Back pain   Jentadueto [Linagliptin-Metformin Hcl Er]     Abdominal pains and diarrhea   Lipitor [Atorvastatin]     Sudden hair loss 2 weeks after starting lipitor.    Victoza [Liraglutide] Other (See Comments)    Abdominal pain and  nausea.     ROS Review of Systems    Objective:    Physical Exam  Constitutional: She is oriented to person, place, and time. She appears well-developed and well-nourished.  HENT:  Head: Normocephalic and atraumatic.  Eyes: Conjunctivae and EOM are normal.  Cardiovascular: Normal rate, regular rhythm and normal heart sounds.  Pulmonary/Chest: Effort normal and breath sounds normal. Right breast exhibits no inverted nipple, no mass, no nipple discharge, no skin change and no tenderness. Left breast exhibits no inverted nipple, no mass, no nipple discharge, no skin change and no tenderness. Breasts are symmetrical.    Neurological: She is alert and oriented to person, place, and time.  Skin: Skin is warm and dry. No pallor.  Psychiatric: She has a normal mood and affect. Her behavior is normal.  Vitals reviewed.   BP 124/70    Pulse (!) 108    Ht 5\' 1"  (1.549 m)    Wt 189 lb (85.7 kg)    SpO2 98%    BMI 35.71 kg/m  Wt Readings from Last 3 Encounters:  06/13/19 189 lb (85.7 kg)  06/10/19 189 lb (85.7 kg)  05/16/19 189 lb (85.7 kg)     Health Maintenance Due  Topic Date Due   Fecal DNA (Cologuard)  02/15/2019   OPHTHALMOLOGY EXAM  06/08/2019    There are no preventive care reminders to display for this patient.  Lab Results  Component Value Date   TSH 1.30 08/20/2016   Lab Results  Component Value Date   WBC 13.7 (H) 06/10/2019   HGB 14.9 06/10/2019   HCT 47.5 (H) 06/10/2019   MCV 88.3 06/10/2019   PLT 267 06/10/2019   Lab Results  Component Value Date   NA 141 06/10/2019   K 3.8 06/10/2019   CO2 26 06/10/2019   GLUCOSE 140 (H) 06/10/2019   BUN 7 06/10/2019   CREATININE 0.55 06/10/2019   BILITOT 0.3 06/10/2019   ALKPHOS 101 06/10/2019   AST 19 06/10/2019   ALT 19 06/10/2019   PROT 7.1 06/10/2019   ALBUMIN 3.9 06/10/2019   CALCIUM 9.2 06/10/2019   ANIONGAP 12 06/10/2019  Lab Results  Component Value Date   CHOL 134 04/18/2019   Lab Results    Component Value Date   HDL 38 (L) 04/18/2019   Lab Results  Component Value Date   LDLCALC 77 04/18/2019   Lab Results  Component Value Date   TRIG 102 04/18/2019   Lab Results  Component Value Date   CHOLHDL 3.5 04/18/2019   Lab Results  Component Value Date   HGBA1C 7.5 (A) 06/13/2019      Assessment & Plan:   Problem List Items Addressed This Visit      Endocrine   Diabetes mellitus type 2 with complications ([place])    N2D improved down to 7.5 so she has made some great strides she is really been trying to change her diet.  Encouraged her to just continue to work at it and let see if we can get it down into the 6 range.      Relevant Orders   POCT glycosylated hemoglobin (Hb A1C) (Completed)    Other Visit Diagnoses    Acute pancreatitis, unspecified complication status, unspecified pancreatitis type    -  Primary   Relevant Orders   Lipase   CBC with Differential/Platelet   Breast mass, right       Relevant Orders   MM DIAG BREAST TOMO UNI RIGHT   US BREAST COMPLETE UNI RIGHT INC AXILLA   Acute bilateral low back pain without sciatica          Acute pancreatitis-continue with bland soft diet and advance as tolerated in the next couple days she is starting to feel some better which is reassuring.  Mid-ow back pain.  It is very lateral almost above the hip bone area.  Still could be discogenic in nature and radiating from her spine.  Offered to give her muscle relaxer but she declined and said she will just take with her Tylenol for now.  Right breast mass-we will schedule for diagnostic mammo-and ultrasound for further work-up she was actually due for her screening mammogram next month anyway.  I was unable to palpate any mass or lesion on exam.  No orders of the defined types were placed in this encounter.   Follow-up: Return if symptoms worsen or fail to improve.    Beatrice Lecher, MD

## 2019-06-13 NOTE — Patient Instructions (Signed)
Your Cologuard results were negative so repeat colon cancer screening in 3 years

## 2019-06-14 LAB — CBC WITH DIFFERENTIAL/PLATELET
Absolute Monocytes: 529 cells/uL (ref 200–950)
Basophils Absolute: 65 cells/uL (ref 0–200)
Basophils Relative: 0.5 %
Eosinophils Absolute: 116 cells/uL (ref 15–500)
Eosinophils Relative: 0.9 %
HCT: 43.9 % (ref 35.0–45.0)
Hemoglobin: 14.5 g/dL (ref 11.7–15.5)
Lymphs Abs: 3870 cells/uL (ref 850–3900)
MCH: 28 pg (ref 27.0–33.0)
MCHC: 33 g/dL (ref 32.0–36.0)
MCV: 84.9 fL (ref 80.0–100.0)
MPV: 10.6 fL (ref 7.5–12.5)
Monocytes Relative: 4.1 %
Neutro Abs: 8321 cells/uL — ABNORMAL HIGH (ref 1500–7800)
Neutrophils Relative %: 64.5 %
Platelets: 295 10*3/uL (ref 140–400)
RBC: 5.17 10*6/uL — ABNORMAL HIGH (ref 3.80–5.10)
RDW: 13.1 % (ref 11.0–15.0)
Total Lymphocyte: 30 %
WBC: 12.9 10*3/uL — ABNORMAL HIGH (ref 3.8–10.8)

## 2019-06-14 LAB — LIPASE: Lipase: 53 U/L (ref 7–60)

## 2019-06-16 ENCOUNTER — Encounter: Payer: Self-pay | Admitting: Family Medicine

## 2019-06-20 ENCOUNTER — Ambulatory Visit: Payer: Managed Care, Other (non HMO)

## 2019-06-20 ENCOUNTER — Other Ambulatory Visit: Payer: Self-pay

## 2019-06-20 ENCOUNTER — Ambulatory Visit
Admission: RE | Admit: 2019-06-20 | Discharge: 2019-06-20 | Disposition: A | Discharge status: Home / Self Care | Payer: Managed Care, Other (non HMO) | Source: Ambulatory Visit | Attending: Family Medicine | Admitting: Family Medicine

## 2019-06-20 DIAGNOSIS — N6310 Unspecified lump in the right breast, unspecified quadrant: Secondary | ICD-10-CM

## 2019-06-21 ENCOUNTER — Other Ambulatory Visit: Payer: Self-pay | Admitting: Family Medicine

## 2019-06-21 DIAGNOSIS — Z1231 Encounter for screening mammogram for malignant neoplasm of breast: Secondary | ICD-10-CM

## 2019-07-26 ENCOUNTER — Ambulatory Visit (INDEPENDENT_AMBULATORY_CARE_PROVIDER_SITE_OTHER): Payer: Managed Care, Other (non HMO)

## 2019-07-26 ENCOUNTER — Other Ambulatory Visit: Payer: Self-pay

## 2019-07-26 DIAGNOSIS — Z1231 Encounter for screening mammogram for malignant neoplasm of breast: Secondary | ICD-10-CM

## 2019-08-07 ENCOUNTER — Other Ambulatory Visit: Payer: Self-pay | Admitting: Family Medicine

## 2019-08-14 ENCOUNTER — Telehealth: Payer: Self-pay

## 2019-08-14 NOTE — Telephone Encounter (Signed)
PA for [employer]

## 2019-08-15 NOTE — Telephone Encounter (Signed)
Approved today Verneita Griffes) PA Case: [id number], Status: Approved, Coverage Starts on: 08/15/2019 12:00:00 AM, Coverage Ends on: 08/14/2020 12:00:00 AM. Pharmacy aware.

## 2019-08-21 ENCOUNTER — Other Ambulatory Visit: Payer: Self-pay | Admitting: Family Medicine

## 2019-08-25 ENCOUNTER — Other Ambulatory Visit: Payer: Self-pay

## 2019-08-25 ENCOUNTER — Other Ambulatory Visit: Payer: Self-pay | Admitting: Family Medicine

## 2019-08-25 ENCOUNTER — Ambulatory Visit (INDEPENDENT_AMBULATORY_CARE_PROVIDER_SITE_OTHER): Payer: Managed Care, Other (non HMO) | Admitting: Osteopathic Medicine

## 2019-08-25 VITALS — BP 130/78 | HR 87

## 2019-08-25 DIAGNOSIS — Z23 Encounter for immunization: Secondary | ICD-10-CM | POA: Diagnosis not present

## 2019-08-25 NOTE — Progress Notes (Signed)
Patient is here for a flu vaccine. Verified no previous allergy to flu vaccine, eggs, or latex. Flu injection to left deltoid with no apparent complications. Patient advised to call with any problems.   

## 2019-10-06 ENCOUNTER — Ambulatory Visit: Payer: Managed Care, Other (non HMO) | Admitting: Family Medicine

## 2019-10-27 ENCOUNTER — Encounter: Payer: Self-pay | Admitting: Family Medicine

## 2019-10-27 ENCOUNTER — Ambulatory Visit (INDEPENDENT_AMBULATORY_CARE_PROVIDER_SITE_OTHER): Payer: Managed Care, Other (non HMO) | Admitting: Family Medicine

## 2019-10-27 ENCOUNTER — Other Ambulatory Visit: Payer: Self-pay

## 2019-10-27 VITALS — BP 126/70 | HR 105 | Ht 61.0 in | Wt 193.0 lb

## 2019-10-27 DIAGNOSIS — M2550 Pain in unspecified joint: Secondary | ICD-10-CM | POA: Diagnosis not present

## 2019-10-27 DIAGNOSIS — M79604 Pain in right leg: Secondary | ICD-10-CM | POA: Insufficient documentation

## 2019-10-27 DIAGNOSIS — E118 Type 2 diabetes mellitus with unspecified complications: Secondary | ICD-10-CM | POA: Diagnosis not present

## 2019-10-27 DIAGNOSIS — I10 Essential (primary) hypertension: Secondary | ICD-10-CM | POA: Diagnosis not present

## 2019-10-27 DIAGNOSIS — M545 Pain in left leg: Secondary | ICD-10-CM | POA: Insufficient documentation

## 2019-10-27 DIAGNOSIS — G8929 Other chronic pain: Secondary | ICD-10-CM

## 2019-10-27 LAB — POCT GLYCOSYLATED HEMOGLOBIN (HGB A1C): Hemoglobin A1C: 9.1 % — AB (ref 4.0–5.6)

## 2019-10-27 MED ORDER — KOMBIGLYZE XR 5-1000 MG PO TB24: 1.0000 | ORAL_TABLET | Freq: Every day | ORAL | 1 refills | Status: DC

## 2019-10-27 MED ORDER — OZEMPIC (0.25 OR 0.5 MG/DOSE) 2 MG/1.5ML ~~LOC~~ SOPN: 0.2500 mg | PEN_INJECTOR | SUBCUTANEOUS | 3 refills | Status: DC

## 2019-10-27 NOTE — Progress Notes (Signed)
Established Patient Office Visit  Subjective:  Patient ID: Terri Zimmerman, female    DOB: Aug 19, 1966  Age: 53 y.o. MRN: [id number]  CC:  Chief Complaint  Patient presents with  . Diabetes    [phone]    HPI Terri Zimmerman presents for  Diabetes - no hypoglycemic events. No wounds or sores that are not healing well. No increased thirst or urination. Checking glucose at home. Taking medications as prescribed without any side effects.  She admits that she has not been doing the best with her diet.  Blood sugars have been running in the 200s to 300s in the last week.  Also complains of chronic low back pain.  She has had some work-up including x-rays.  She says it is definitely worse with prolonged standing and walking particularly on concrete floors.  She mostly relies on Tylenol and Aleve to help with her pain.  She has never had any type of formal physical therapy or injections.  More recently she has been experiencing some joint pain in her small joints in her hands and elbows.  Her mother had rheumatoid arthritis and she wonders if she could be developing rheumatoid.  She does occasionally experience some joint swelling in her fingers.  She does not have any swelling today.  Grandchild due March 20ths.   Past Medical History:  Diagnosis Date  . Asthma 04-25-07  . Diabetes mellitus 11-12-06   type 2  . Hypertension 11-04-06    Past Surgical History:  Procedure Laterality Date  . ABDOMINAL HYSTERECTOMY  12-11   Complete  . APPENDECTOMY  9-00  . BREAST BIOPSY Left   . BREAST CYST EXCISION Left   . BREAST SURGERY  4-00   of cyst  . FOOT TENDON SURGERY  05/16/2012   bone spur.    Marland Kitchen LAPAROSCOPY  4-08   for cyst and endometriosis  . left breast bx  12-11    Family History  Problem Relation Age of Onset  . Other Mother        RA and CHF  . Diabetes Father   . Diabetes Other   . Heart disease Other     Social History   Socioeconomic  History  . Marital status: Married    Spouse name: Not on file  . Number of children: Not on file  . Years of education: Not on file  . Highest education level: Not on file  Occupational History  . Not on file  Tobacco Use  . Smoking status: Current Every Day Smoker    Packs/day: 0.50    Types: Cigarettes  . Smokeless tobacco: Never Used  Substance and Sexual Activity  . Alcohol use: No  . Drug use: No  . Sexual activity: Not on file  Other Topics Concern  . Not on file  Social History Narrative  . Not on file   Social Determinants of Health   Financial Resource Strain:   . Difficulty of Paying Living Expenses: Not on file  Food Insecurity:   . Worried About Charity fundraiser in the Last Year: Not on file  . Ran Out of Food in the Last Year: Not on file  Transportation Needs:   . Lack of Transportation (Medical): Not on file  . Lack of Transportation (Non-Medical): Not on file  Physical Activity:   . Days of Exercise per Week: Not on file  . Minutes of Exercise per Session: Not on file  Stress:   .  Feeling of Stress : Not on file  Social Connections:   . Frequency of Communication with Friends and Family: Not on file  . Frequency of Social Gatherings with Friends and Family: Not on file  . Attends Religious Services: Not on file  . Active Member of Clubs or Organizations: Not on file  . Attends Archivist Meetings: Not on file  . Marital Status: Not on file  Intimate Partner Violence:   . Fear of Current or Ex-Partner: Not on file  . Emotionally Abused: Not on file  . Physically Abused: Not on file  . Sexually Abused: Not on file    Outpatient Medications Prior to Visit  Medication Sig Dispense Refill  . acetaminophen (TYLENOL) 325 MG tablet Take 650 mg by mouth 2 (two) times daily.    . AMBULATORY NON FORMULARY MEDICATION One Touch Ultra Test Strips.  Diagnosis: DM  Testing 2 times a day 100 each 5  . AMBULATORY NON FORMULARY MEDICATION Medication  Name: Please set CPAP to 11 cm of water pressure.  Please fax download in 2 weeks on new pressure setting.  Please fax order to [facility] (fax: [phone]) 1 vial 0  . AMBULATORY NON FORMULARY MEDICATION 1 each by Other route 2 (two) times daily. Lancets for daily glucose checks Diagnosis: E11.9 Testing 2 times a day 200 each 5  . glucose blood (ONE TOUCH ULTRA TEST) test strip USE TO TEST BLOOD SUGAR DAILY 25 each 5  . Insulin Glargine (BASAGLAR KWIKPEN) 100 UNIT/ML SOPN Inject 0.8 mLs (80 Units total) into the skin 2 (two) times daily. Send home with patient when discharged. Do NOT mix with other insulins. 18 pen PRN  . Insulin Pen Needle 31G X 5 MM MISC Use to inject insulin up to three (3) times daily. Diagnosis DM E11.9 100 each prn  . lisinopril (ZESTRIL) 20 MG tablet TAKE 1 TABLET BY MOUTH EVERY DAY 90 tablet 1  . metoprolol succinate (TOPROL-XL) 25 MG 24 hr tablet TAKE 1 TABLET BY MOUTH EVERYDAY AT BEDTIME 90 tablet 1  . pravastatin (PRAVACHOL) 20 MG tablet Take 1 tablet (20 mg total) by mouth daily. 90 tablet 3  . albuterol (PROVENTIL HFA;VENTOLIN HFA) 108 (90 Base) MCG/ACT inhaler Inhale 2 puffs into the lungs every 6 (six) hours as needed for wheezing or shortness of breath. 1 Inhaler 0  . HYDROcodone-acetaminophen (NORCO/VICODIN) 5-325 MG tablet Take 1 tablet by mouth every 6 (six) hours as needed. 12 tablet 0  . KOMBIGLYZE XR 03-999 MG TB24 TAKE 1 TABLET BY MOUTH EVERY DAY 90 tablet 1  . ondansetron (ZOFRAN ODT) 4 MG disintegrating tablet 107m ODT q4 hours prn nausea/vomit 10 tablet 0   No facility-administered medications prior to visit.    Allergies  Allergen Reactions  . Trulicity [Dulaglutide] Nausea Only    Abdominal pain,  . Farxiga [Dapagliflozin]     foggy  . Invokana [Canagliflozin] Other (See Comments)    Back pain  . Jentadueto [Linagliptin-Metformin Hcl Er]     Abdominal pains and diarrhea  . Lipitor [Atorvastatin]     Sudden hair loss 2 weeks after starting  lipitor.   .Amaranta Bernard[Liraglutide] Other (See Comments)    Abdominal pain and nausea.     ROS Review of Systems    Objective:    Physical Exam  Constitutional: She is oriented to person, place, and time. She appears well-developed and well-nourished.  HENT:  Head: Normocephalic and atraumatic.  Cardiovascular: Normal rate, regular rhythm and normal heart  sounds.  Pulmonary/Chest: Effort normal and breath sounds normal.  Musculoskeletal:     Comments: No noticeable swelling of hand joints today.  Neurological: She is alert and oriented to person, place, and time.  Skin: Skin is warm and dry.  Psychiatric: She has a normal mood and affect. Her behavior is normal.    BP 126/70   Pulse (!) 105   Ht _0  (1.549 m)   Wt 193 lb (87.5 kg)   SpO2 99%   BMI 36.47 kg/m  Wt Readings from Last 3 Encounters:  10/27/19 193 lb (87.5 kg)  06/13/19 189 lb (85.7 kg)  06/10/19 189 lb (85.7 kg)     Health Maintenance Due  Topic Date Due  . OPHTHALMOLOGY EXAM  06/08/2019    There are no preventive care reminders to display for this patient.  Lab Results  Component Value Date   TSH 1.30 08/20/2016   Lab Results  Component Value Date   WBC 12.9 (H) 06/13/2019   HGB 14.5 06/13/2019   HCT 43.9 06/13/2019   MCV 84.9 06/13/2019   PLT 295 06/13/2019   Lab Results  Component Value Date   NA 141 06/10/2019   K 3.8 06/10/2019   CO2 26 06/10/2019   GLUCOSE 140 (H) 06/10/2019   BUN 7 06/10/2019   CREATININE 0.55 06/10/2019   BILITOT 0.3 06/10/2019   ALKPHOS 101 06/10/2019   AST 19 06/10/2019   ALT 19 06/10/2019   PROT 7.1 06/10/2019   ALBUMIN 3.9 06/10/2019   CALCIUM 9.2 06/10/2019   ANIONGAP 12 06/10/2019   Lab Results  Component Value Date   CHOL 134 04/18/2019   Lab Results  Component Value Date   HDL 38 (L) 04/18/2019   Lab Results  Component Value Date   LDLCALC 77 04/18/2019   Lab Results  Component Value Date   TRIG 102 04/18/2019   Lab Results   Component Value Date   CHOLHDL 3.5 04/18/2019   Lab Results  Component Value Date   HGBA1C 9.1 (A) 10/27/2019      Assessment & Plan:   Problem List Items Addressed This Visit      Cardiovascular and Mediastinum   Essential hypertension     Endocrine   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  Hemoglobin A1c up to 9.1.  Discussed options.  She is willing to commit to really working on her diet and getting more exercise which she admits has not been good lately.  We also discussed trying another incretin mimetic so we will try Ozempic.  Did warn about potential for nausea which is what she experienced with the other similar drugs.      Relevant Medications   Saxagliptin-Metformin (KOMBIGLYZE XR) 03-999 MG TB24   Semaglutide,0.25 or 0.5MG/DOS, (OZEMPIC, 0.25 OR 0.5 MG/DOSE,) 2 MG/1.5ML SOPN   Other Relevant Orders   BASIC METABOLIC PANEL WITH GFR   POCT HgB A1C (Completed)   ANA   Rheumatoid factor   Sed Rate (ESR)   Cyclic citrul peptide antibody, IgG   Uric acid     Other   Chronic midline low back pain without sciatica    Gust options including formal physical therapy or referral for consultation for possible injections.  She will think about it.  Also discussed the option of doing Cymbalta which is controlled does show about a 30% reduction in pain with chronic musculoskeletal pain.  She will think about it.  Other Visit Diagnoses    Polyarthralgia       Relevant Orders   BASIC METABOLIC PANEL WITH GFR   ANA   Rheumatoid factor   Sed Rate (ESR)   Cyclic citrul peptide antibody, IgG   Uric acid     Arthralgia-we will check for rheumatoid as well as check a uric acid level.   Meds ordered this encounter  Medications  . Saxagliptin-Metformin (KOMBIGLYZE XR) 03-999 MG TB24    Sig: Take 1 tablet by mouth daily.    Dispense:  90 tablet    Refill:  1  . Semaglutide,0.25 or 0.5MG/DOS, (OZEMPIC, 0.25 OR 0.5 MG/DOSE,) 2  MG/1.5ML SOPN    Sig: Inject 0.25 mg into the skin once a week.    Dispense:  4 pen    Refill:  3    Follow-up: Return in about 6 weeks (around 12/08/2019) for Diabetes follow-up.    Beatrice Lecher, MD

## 2019-10-27 NOTE — Assessment & Plan Note (Signed)
Gust options including formal physical therapy or referral for consultation for possible injections.  She will think about it.  Also discussed the option of doing Cymbalta which is controlled does show about a 30% reduction in pain with chronic musculoskeletal pain.  She will think about it.

## 2019-10-27 NOTE — Assessment & Plan Note (Signed)
Uncontrolled.  Hemoglobin A1c up to 9.1.  Discussed options.  She is willing to commit to really working on her diet and getting more exercise which she admits has not been good lately.  We also discussed trying another incretin mimetic so we will try Ozempic.  Did warn about potential for nausea which is what she experienced with the other similar drugs.

## 2019-10-30 LAB — BASIC METABOLIC PANEL WITH GFR
BUN: 10 mg/dL (ref 7–25)
CO2: 27 mmol/L (ref 20–32)
Calcium: 9.8 mg/dL (ref 8.6–10.4)
Chloride: 102 mmol/L (ref 98–110)
Creat: 0.7 mg/dL (ref 0.50–1.05)
GFR, Est African American: 115 mL/min/{1.73_m2} (ref >=60)
GFR, Est Non African American: 99 mL/min/{1.73_m2} (ref >=60)
Glucose, Bld: 229 mg/dL — ABNORMAL HIGH (ref 65–99)
Potassium: 4.3 mmol/L (ref 3.5–5.3)
Sodium: 141 mmol/L (ref 135–146)

## 2019-10-30 LAB — RHEUMATOID FACTOR: Rheumatoid fact SerPl-aCnc: <14 IU/mL (ref <14)

## 2019-10-30 LAB — ANA: Anti Nuclear Antibody (ANA): NEGATIVE

## 2019-10-30 LAB — SEDIMENTATION RATE: Sed Rate: 14 mm/h (ref 0–30)

## 2019-10-30 LAB — CYCLIC CITRUL PEPTIDE ANTIBODY, IGG: Cyclic Citrullin Peptide Ab: <16 UNITS

## 2019-10-30 LAB — URIC ACID: Uric Acid, Serum: 3.8 mg/dL (ref 2.5–7.0)

## 2019-11-16 ENCOUNTER — Other Ambulatory Visit: Payer: Self-pay | Admitting: Family Medicine

## 2019-11-24 ENCOUNTER — Telehealth: Payer: Self-pay | Admitting: Family Medicine

## 2019-11-24 NOTE — Telephone Encounter (Signed)
Pt advised. She informed me that she had just picked up this prescription and it was expensive. I told her that the pharmacy would probably not take this back being that she has already taken it home even though it is sealed.  She also wanted to let Dr. Madilyn Fireman know that she is NOT doing well on the [place]. It causes abdominal pain,diarrhea and just not feeling well overall. She stated that she is not going to take it anymore and has begun to change her eating habits and increase her activity. She usually does the Ozempic on fridays but will not take this weeks dose.   She wanted to speak w/Dr. Madilyn Fireman about her DM medications and see if something can be changed because she's not doing well.  She also informed me that [facility] sent her a video message about pravastatin but she wasn't able to open it and will call the pharmacy about this but wanted Dr. Madilyn Fireman to know and see if she knew anything about this.  She has a f/u appt on 12/11/2019 to discuss the Ozempic.

## 2019-11-24 NOTE — Telephone Encounter (Signed)
Pt advised and will continue medication until she runs out.Terri Zimmerman, [place] Crocker, CMA

## 2019-11-24 NOTE — Telephone Encounter (Signed)
OK, will add ozempic to intolerance list.  Ok to take the [facility] for now since just filled it but when starts to run out of medication then we can change to meformin and long acting insulin.  I am excited that she is really put focus on diet and exercise. It will make a big differnce in her numbers.

## 2019-11-24 NOTE — Telephone Encounter (Signed)
Please call Nelline, I know she had the episode of pancreatitis back in the summer.  The onglyza part of your kombiglyze can increase your risk for pancreatitis. I think we should consider changing your medication so you are at less risk of that happening again.

## 2019-12-11 ENCOUNTER — Ambulatory Visit (INDEPENDENT_AMBULATORY_CARE_PROVIDER_SITE_OTHER): Payer: Managed Care, Other (non HMO) | Admitting: Family Medicine

## 2019-12-11 ENCOUNTER — Encounter: Payer: Self-pay | Admitting: Family Medicine

## 2019-12-11 ENCOUNTER — Other Ambulatory Visit: Payer: Self-pay

## 2019-12-11 VITALS — BP 128/70 | HR 106 | Ht 61.0 in | Wt 191.0 lb

## 2019-12-11 DIAGNOSIS — E118 Type 2 diabetes mellitus with unspecified complications: Secondary | ICD-10-CM

## 2019-12-11 DIAGNOSIS — H5712 Ocular pain, left eye: Secondary | ICD-10-CM

## 2019-12-11 DIAGNOSIS — R300 Dysuria: Secondary | ICD-10-CM

## 2019-12-11 LAB — POCT URINALYSIS DIP (CLINITEK)
Bilirubin, UA: NEGATIVE
Glucose, UA: 500 mg/dL — AB
Ketones, POC UA: NEGATIVE mg/dL
Leukocytes, UA: NEGATIVE
Nitrite, UA: NEGATIVE
POC PROTEIN,UA: NEGATIVE
Spec Grav, UA: 1.015 (ref 1.010–1.025)
Urobilinogen, UA: 0.2 E.U./dL
pH, UA: 5.5 (ref 5.0–8.0)

## 2019-12-11 MED ORDER — INSULIN LISPRO (1 UNIT DIAL) 100 UNIT/ML (KWIKPEN): PEN_INJECTOR | SUBCUTANEOUS | 3 refills | Status: DC

## 2019-12-11 NOTE — Assessment & Plan Note (Signed)
Uncontrolled.  We discussed options since she did not tolerate the Ozempic.  We will add that to her intolerance low she has tried multiple diabetes drugs.  Still a little bit hesitant about the [place] because of her history of pancreatitis but right now it is one of the few medicines she is actually tolerated so we discussed continuing it for now just keeping an eye on everything.  She has lost 2 more pounds which is great.  She says she has been using her Basaglar consistently.  She has been trying to really work on her diet.  We discussed adding mealtime insulin just just her evening meal which she says is her largest meal the day we will start with 5 units.  Encouraged her to check her blood sugars over the next week and give [place] that information so that I can adjust her dose.

## 2019-12-11 NOTE — Progress Notes (Signed)
Established Patient Office Visit  Subjective:  Patient ID: Terri Zimmerman, female    DOB: 1965/11/23  Age: 54 y.o. MRN: [id number]  CC:  Chief Complaint  Patient presents with  . Diabetes  . burning with urination    HPI Terri Zimmerman presents for 6 week f/u.   Diabetes - no hypoglycemic events. No wounds or sores that are not healing well. No increased thirst or urination. Checking glucose at home. Stopped the Ozempic bc of abdominal pain and nausea.  She is also committed to really working on her diet and exercise.  She has lost 2 pounds and says she feels like she has been trying to work on her diet.  She is now having some urinary frequency for several days as well as some burning.  She says initially she had a little bit of itching but that seems to have actually gone away.  She also reports that her urine has a strong odor.  She also complains of some left eye pain that is been going on for a while in fact she saw her eye doctor who put her on an antibiotic drop.  She says it really did not seem to help and she has a follow-up today with him to take another look at it.  She says she has a little bit of discomfort in the right eye as well but it is mostly the left.  Does like it is almost swelling at times.   Past Medical History:  Diagnosis Date  . Asthma 04-25-07  . Diabetes mellitus 11-12-06   type 2  . Hypertension 11-04-06    Past Surgical History:  Procedure Laterality Date  . ABDOMINAL HYSTERECTOMY  12-11   Complete  . APPENDECTOMY  9-00  . BREAST BIOPSY Left   . BREAST CYST EXCISION Left   . BREAST SURGERY  4-00   of cyst  . FOOT TENDON SURGERY  05/16/2012   bone spur.    Marland Kitchen LAPAROSCOPY  4-08   for cyst and endometriosis  . left breast bx  12-11    Family History  Problem Relation Age of Onset  . Other Mother        RA and CHF  . Diabetes Father   . Diabetes Other   . Heart disease Other     Social History   Socioeconomic History  . Marital status:  Married    Spouse name: Not on file  . Number of children: Not on file  . Years of education: Not on file  . Highest education level: Not on file  Occupational History  . Not on file  Tobacco Use  . Smoking status: Current Every Day Smoker    Packs/day: 0.50    Types: Cigarettes  . Smokeless tobacco: Never Used  Substance and Sexual Activity  . Alcohol use: No  . Drug use: No  . Sexual activity: Not on file  Other Topics Concern  . Not on file  Social History Narrative  . Not on file   Social Determinants of Health   Financial Resource Strain:   . Difficulty of Paying Living Expenses: Not on file  Food Insecurity:   . Worried About Charity fundraiser in the Last Year: Not on file  . Ran Out of Food in the Last Year: Not on file  Transportation Needs:   . Lack of Transportation (Medical): Not on file  . Lack of Transportation (Non-Medical): Not on file  Physical Activity:   .  Days of Exercise per Week: Not on file  . Minutes of Exercise per Session: Not on file  Stress:   . Feeling of Stress : Not on file  Social Connections:   . Frequency of Communication with Friends and Family: Not on file  . Frequency of Social Gatherings with Friends and Family: Not on file  . Attends Religious Services: Not on file  . Active Member of Clubs or Organizations: Not on file  . Attends Archivist Meetings: Not on file  . Marital Status: Not on file  Intimate Partner Violence:   . Fear of Current or Ex-Partner: Not on file  . Emotionally Abused: Not on file  . Physically Abused: Not on file  . Sexually Abused: Not on file    Outpatient Medications Prior to Visit  Medication Sig Dispense Refill  . acetaminophen (TYLENOL) 325 MG tablet Take 650 mg by mouth 2 (two) times daily.    . AMBULATORY NON FORMULARY MEDICATION One Touch Ultra Test Strips.  Diagnosis: DM  Testing 2 times a day 100 each 5  . AMBULATORY NON FORMULARY MEDICATION Medication Name: Please set CPAP to 11  cm of water pressure.  Please fax download in 2 weeks on new pressure setting.  Please fax order to [facility] (fax: [phone]) 1 vial 0  . AMBULATORY NON FORMULARY MEDICATION 1 each by Other route 2 (two) times daily. Lancets for daily glucose checks Diagnosis: E11.9 Testing 2 times a day 200 each 5  . glucose blood (ONE TOUCH ULTRA TEST) test strip USE TO TEST BLOOD SUGAR DAILY 25 each 5  . Insulin Glargine (BASAGLAR KWIKPEN) 100 UNIT/ML SOPN Inject 0.8 mLs (80 Units total) into the skin 2 (two) times daily. Send home with patient when discharged. Do NOT mix with other insulins. 18 pen PRN  . Insulin Pen Needle 31G X 5 MM MISC Use to inject insulin up to three (3) times daily. Diagnosis DM E11.9 100 each prn  . lisinopril (ZESTRIL) 20 MG tablet TAKE 1 TABLET BY MOUTH EVERY DAY 90 tablet 1  . metoprolol succinate (TOPROL-XL) 25 MG 24 hr tablet TAKE 1 TABLET BY MOUTH EVERYDAY AT BEDTIME 90 tablet 1  . pravastatin (PRAVACHOL) 20 MG tablet TAKE 1 TABLET BY MOUTH EVERY DAY 90 tablet 3  . Saxagliptin-Metformin (KOMBIGLYZE XR) 03-999 MG TB24 Take 1 tablet by mouth daily. 90 tablet 1  . Semaglutide,0.25 or 0.5MG /DOS, (OZEMPIC, 0.25 OR 0.5 MG/DOSE,) 2 MG/1.5ML SOPN Inject 0.25 mg into the skin once a week. 4 pen 3   No facility-administered medications prior to visit.    Allergies  Allergen Reactions  . Trulicity [Dulaglutide] Nausea Only    Abdominal pain,  . Farxiga [Dapagliflozin]     foggy  . Invokana [Canagliflozin] Other (See Comments)    Back pain  . Jentadueto [Linagliptin-Metformin Hcl Er]     Abdominal pains and diarrhea  . Lipitor [Atorvastatin]     Sudden hair loss 2 weeks after starting lipitor.   Lance Muss [Saxagliptin] Other (See Comments)    Pancreatitis  . Ozempic (0.25 Or 0.5 Mg-Dose) [Semaglutide(0.25 Or 0.5mg -Dos)] Diarrhea and Other (See Comments)    Abdominal pain  . Victoza [Liraglutide] Other (See Comments)    Abdominal pain and nausea.     ROS Review of  Systems    Objective:    Physical Exam  Constitutional: She is oriented to person, place, and time. She appears well-developed and well-nourished.  HENT:  Head: Normocephalic and atraumatic.  Cardiovascular: Normal  rate, regular rhythm and normal heart sounds.  Pulmonary/Chest: Effort normal and breath sounds normal.  Neurological: She is alert and oriented to person, place, and time.  Skin: Skin is warm and dry.  Psychiatric: She has a normal mood and affect. Her behavior is normal.    BP 128/70   Pulse (!) 106   Ht 5\' 1"  (1.549 m)   Wt 191 lb (86.6 kg)   SpO2 98%   BMI 36.09 kg/m  Wt Readings from Last 3 Encounters:  12/11/19 191 lb (86.6 kg)  10/27/19 193 lb (87.5 kg)  06/13/19 189 lb (85.7 kg)     There are no preventive care reminders to display for this patient.  There are no preventive care reminders to display for this patient.  Lab Results  Component Value Date   TSH 1.30 08/20/2016   Lab Results  Component Value Date   WBC 12.9 (H) 06/13/2019   HGB 14.5 06/13/2019   HCT 43.9 06/13/2019   MCV 84.9 06/13/2019   PLT 295 06/13/2019   Lab Results  Component Value Date   NA 141 10/27/2019   K 4.3 10/27/2019   CO2 27 10/27/2019   GLUCOSE 229 (H) 10/27/2019   BUN 10 10/27/2019   CREATININE 0.70 10/27/2019   BILITOT 0.3 06/10/2019   ALKPHOS 101 06/10/2019   AST 19 06/10/2019   ALT 19 06/10/2019   PROT 7.1 06/10/2019   ALBUMIN 3.9 06/10/2019   CALCIUM 9.8 10/27/2019   ANIONGAP 12 06/10/2019   Lab Results  Component Value Date   CHOL 134 04/18/2019   Lab Results  Component Value Date   HDL 38 (L) 04/18/2019   Lab Results  Component Value Date   LDLCALC 77 04/18/2019   Lab Results  Component Value Date   TRIG 102 04/18/2019   Lab Results  Component Value Date   CHOLHDL 3.5 04/18/2019   Lab Results  Component Value Date   HGBA1C 9.1 (A) 10/27/2019      Assessment & Plan:   Problem List Items Addressed This Visit      Endocrine    Diabetes mellitus type 2 with complications ([place])    Uncontrolled.  We discussed options since she did not tolerate the Ozempic.  We will add that to her intolerance low she has tried multiple diabetes drugs.  Still a little bit hesitant about the [place] because of her history of pancreatitis but right now it is one of the few medicines she is actually tolerated so we discussed continuing it for now just keeping an eye on everything.  She has lost 2 more pounds which is great.  She says she has been using her Basaglar consistently.  She has been trying to really work on her diet.  We discussed adding mealtime insulin just just her evening meal which she says is her largest meal the day we will start with 5 units.  Encouraged her to check her blood sugars over the next week and give [place] that information so that I can adjust her dose.      Relevant Medications   insulin lispro (HUMALOG KWIKPEN) 100 UNIT/ML KwikPen    Other Visit Diagnoses    Burning with urination    -  Primary   Relevant Orders   POCT URINALYSIS DIP (CLINITEK) (Completed)   Urine Culture   Left eye pain         Left eye pain-has follow-up with her eye doctor today.  He does have a little to  mild scleral injection and has had some blurry vision though the blurriness could be coming from the uncontrolled diabetes.  Dysuria-possible UTI though her urinalysis was negative for nitrites and leukocytes today.  Will send for culture for confirmation and further work-up.  To do a wet prep as well but she declined and felt like she was not having any vaginitis symptoms currently.  Meds ordered this encounter  Medications  . insulin lispro (HUMALOG KWIKPEN) 100 UNIT/ML KwikPen    Sig: 5-10 units [place] before evening meal.    Dispense:  15 mL    Refill:  3    Follow-up: Return in about 6 weeks (around 01/22/2020) for Diabetes follow-up.    Beatrice Lecher, MD

## 2019-12-11 NOTE — Patient Instructions (Signed)
Lets continue the [place] for now since you have tolerated it really well.  You can add a short time mealtime insulin before your evening meal.  You can give it about 10 to 15 minutes before you eat.  If for some reason you forget then just give it as soon as you remember.  If it has already been 45 minutes after you ate been just skip it.  By to check your sugar before each meal and write it down on a piece of paper and then check your sugar about 2 hours after you eat for the next week and then send those numbers to me that we can take a look and make an adjustment.

## 2019-12-12 LAB — URINE CULTURE
MICRO NUMBER:: 10078552
SPECIMEN QUALITY:: ADEQUATE

## 2019-12-14 ENCOUNTER — Other Ambulatory Visit: Payer: Self-pay | Admitting: *Deleted

## 2019-12-14 DIAGNOSIS — E118 Type 2 diabetes mellitus with unspecified complications: Secondary | ICD-10-CM

## 2019-12-14 MED ORDER — AMBULATORY NON FORMULARY MEDICATION: 12 refills | Status: DC

## 2019-12-14 MED ORDER — GLUCOSE BLOOD VI STRP: ORAL_STRIP | 11 refills | Status: DC

## 2019-12-21 ENCOUNTER — Telehealth: Payer: Self-pay | Admitting: Family Medicine

## 2019-12-21 NOTE — Telephone Encounter (Signed)
Average is 227 per day for her sugar levels.   Lowest is 146: 12/19/2019.  Highest 348: 12/14/2019.   Started on 12/13/2019 for Humalog 5 units starting on 12/13/2019 in addition to the [place]. She is watching what she is eating and limiting her carbohydrate and sugar intake. She wants to know if she will need to adjust any of the Humalog Kwikpen. Please advise.

## 2019-12-22 NOTE — Telephone Encounter (Signed)
I called patient and she voices understanding. She did not have any questions.

## 2019-12-22 NOTE — Telephone Encounter (Signed)
OK sounds promising!!! Increase the Humalog at her largest meal to 8 units.  Continue to monitor sugar and reach out again in 2 weeks to let us know how that is going!!!

## 2020-01-22 ENCOUNTER — Encounter: Payer: Self-pay | Admitting: Family Medicine

## 2020-01-22 ENCOUNTER — Ambulatory Visit (INDEPENDENT_AMBULATORY_CARE_PROVIDER_SITE_OTHER): Payer: Managed Care, Other (non HMO) | Admitting: Family Medicine

## 2020-01-22 VITALS — BP 124/70 | HR 93 | Ht 61.0 in | Wt 191.0 lb

## 2020-01-22 DIAGNOSIS — E6609 Other obesity due to excess calories: Secondary | ICD-10-CM | POA: Diagnosis not present

## 2020-01-22 DIAGNOSIS — Z6836 Body mass index (BMI) 36.0-36.9, adult: Secondary | ICD-10-CM

## 2020-01-22 DIAGNOSIS — I10 Essential (primary) hypertension: Secondary | ICD-10-CM | POA: Diagnosis not present

## 2020-01-22 DIAGNOSIS — E118 Type 2 diabetes mellitus with unspecified complications: Secondary | ICD-10-CM | POA: Diagnosis not present

## 2020-01-22 LAB — POCT GLYCOSYLATED HEMOGLOBIN (HGB A1C): Hemoglobin A1C: 8.4 % — AB (ref 4.0–5.6)

## 2020-01-22 NOTE — Assessment & Plan Note (Signed)
Well controlled. Continue current regimen. Follow up in  3-4 months.  

## 2020-01-22 NOTE — Progress Notes (Signed)
Established Patient Office Visit  Subjective:  Patient ID: Terri Zimmerman, female    DOB: August 13, 1966  Age: 54 y.o. MRN: [id number]  CC:  Chief Complaint  Patient presents with  . Diabetes  . Hypertension    HPI Terri Zimmerman presents for   Hypertension- Pt denies chest pain, SOB, dizziness, or heart palpitations.  Taking meds as directed w/o problems.  Denies medication side effects.    Diabetes - no hypoglycemic events. No wounds or sores that are not healing well. No increased thirst or urination. Checking glucose at home. Taking medications as prescribed without any side effects.  A1c 9.1 up from 7-1/2.  Currently on Kombiglyze and 80 units twice a day on the [place].  8 units of Humalog before meals. Has one more month of Kombiglyze and then will be out.     She is starting to change her diet. She wants to lost weight. She has maintained.  She is going back on her protein only breakfast and lean lunch.  She has cut out all diet sodas.       Past Medical History:  Diagnosis Date  . Asthma 04-25-07  . Diabetes mellitus 11-12-06   type 2  . Hypertension 11-04-06    Past Surgical History:  Procedure Laterality Date  . ABDOMINAL HYSTERECTOMY  12-11   Complete  . APPENDECTOMY  9-00  . BREAST BIOPSY Left   . BREAST CYST EXCISION Left   . BREAST SURGERY  4-00   of cyst  . FOOT TENDON SURGERY  05/16/2012   bone spur.    [facility] LAPAROSCOPY  4-08   for cyst and endometriosis  . left breast bx  12-11    Family History  Problem Relation Age of Onset  . Other Mother        RA and CHF  . Diabetes Father   . Diabetes Other   . Heart disease Other     Social History   Socioeconomic History  . Marital status: Married    Spouse name: Not on file  . Number of children: Not on file  . Years of education: Not on file  . Highest education level: Not on file  Occupational History  . Not on file  Tobacco Use  . Smoking status: Current Every Day Smoker    Packs/day: 0.50   Types: Cigarettes  . Smokeless tobacco: Never Used  Substance and Sexual Activity  . Alcohol use: No  . Drug use: No  . Sexual activity: Not on file  Other Topics Concern  . Not on file  Social History Narrative  . Not on file   Social Determinants of Health   Financial Resource Strain:   . Difficulty of Paying Living Expenses: Not on file  Food Insecurity:   . Worried About Charity fundraiser in the Last Year: Not on file  . Ran Out of Food in the Last Year: Not on file  Transportation Needs:   . Lack of Transportation (Medical): Not on file  . Lack of Transportation (Non-Medical): Not on file  Physical Activity:   . Days of Exercise per Week: Not on file  . Minutes of Exercise per Session: Not on file  Stress:   . Feeling of Stress : Not on file  Social Connections:   . Frequency of Communication with Friends and Family: Not on file  . Frequency of Social Gatherings with Friends and Family: Not on file  . Attends Religious Services: Not on  file  . Active Member of Clubs or Organizations: Not on file  . Attends Archivist Meetings: Not on file  . Marital Status: Not on file  Intimate Partner Violence:   . Fear of Current or Ex-Partner: Not on file  . Emotionally Abused: Not on file  . Physically Abused: Not on file  . Sexually Abused: Not on file    Outpatient Medications Prior to Visit  Medication Sig Dispense Refill  . acetaminophen (TYLENOL) 325 MG tablet Take 650 mg by mouth 2 (two) times daily.    . AMBULATORY NON FORMULARY MEDICATION 1 each by Other route 2 (two) times daily. Lancets for daily glucose checks Diagnosis: E11.9 Testing 2 times a day 200 each 5  . glucose blood (ONE TOUCH ULTRA TEST) test strip USE TO TEST BLOOD SUGARS 2 TIMES A DAY. DX:E11.8 200 each 11  . Insulin Glargine (BASAGLAR KWIKPEN) 100 UNIT/ML SOPN Inject 0.8 mLs (80 Units total) into the skin 2 (two) times daily. Send home with patient when discharged. Do NOT mix with other  insulins. 18 pen PRN  . insulin lispro (HUMALOG KWIKPEN) 100 UNIT/ML KwikPen 5-10 units [place] before evening meal. 15 mL 3  . Insulin Pen Needle 31G X 5 MM MISC Use to inject insulin up to three (3) times daily. Diagnosis DM E11.9 100 each prn  . lisinopril (ZESTRIL) 20 MG tablet TAKE 1 TABLET BY MOUTH EVERY DAY 90 tablet 1  . metoprolol succinate (TOPROL-XL) 25 MG 24 hr tablet TAKE 1 TABLET BY MOUTH EVERYDAY AT BEDTIME 90 tablet 1  . OneTouch Delica Lancets 99991111 MISC UAD AS DIRECTED    . pravastatin (PRAVACHOL) 20 MG tablet TAKE 1 TABLET BY MOUTH EVERY DAY 90 tablet 3  . Saxagliptin-Metformin (KOMBIGLYZE XR) 03-999 MG TB24 Take 1 tablet by mouth daily. 90 tablet 1   No facility-administered medications prior to visit.    Allergies  Allergen Reactions  . Trulicity [Dulaglutide] Nausea Only    Abdominal pain,  . Farxiga [Dapagliflozin]     foggy  . Invokana [Canagliflozin] Other (See Comments)    Back pain  . Jentadueto [Linagliptin-Metformin Hcl Er]     Abdominal pains and diarrhea  . Lipitor [Atorvastatin]     Sudden hair loss 2 weeks after starting lipitor.   Lance Muss [Saxagliptin] Other (See Comments)    Pancreatitis  . Ozempic (0.25 Or 0.5 Mg-Dose) [Semaglutide(0.25 Or 0.5mg -Dos)] Diarrhea and Other (See Comments)    Abdominal pain  . Victoza [Liraglutide] Other (See Comments)    Abdominal pain and nausea.     ROS Review of Systems    Objective:    Physical Exam  Constitutional: She is oriented to person, place, and time. She appears well-developed and well-nourished.  HENT:  Head: Normocephalic and atraumatic.  Cardiovascular: Normal rate, regular rhythm and normal heart sounds.  Pulmonary/Chest: Effort normal and breath sounds normal.  Neurological: She is alert and oriented to person, place, and time.  Skin: Skin is warm and dry.  Psychiatric: She has a normal mood and affect. Her behavior is normal.    BP 124/70   Pulse 93   Ht 5\' 1"  (1.549 m)   Wt 191 lb  (86.6 kg)   SpO2 97%   BMI 36.09 kg/m  Wt Readings from Last 3 Encounters:  01/22/20 191 lb (86.6 kg)  12/11/19 191 lb (86.6 kg)  10/27/19 193 lb (87.5 kg)     There are no preventive care reminders to display for this patient.  There are no preventive care reminders to display for this patient.  Lab Results  Component Value Date   TSH 1.30 08/20/2016   Lab Results  Component Value Date   WBC 12.9 (H) 06/13/2019   HGB 14.5 06/13/2019   HCT 43.9 06/13/2019   MCV 84.9 06/13/2019   PLT 295 06/13/2019   Lab Results  Component Value Date   NA 141 10/27/2019   K 4.3 10/27/2019   CO2 27 10/27/2019   GLUCOSE 229 (H) 10/27/2019   BUN 10 10/27/2019   CREATININE 0.70 10/27/2019   BILITOT 0.3 06/10/2019   ALKPHOS 101 06/10/2019   AST 19 06/10/2019   ALT 19 06/10/2019   PROT 7.1 06/10/2019   ALBUMIN 3.9 06/10/2019   CALCIUM 9.8 10/27/2019   ANIONGAP 12 06/10/2019   Lab Results  Component Value Date   CHOL 134 04/18/2019   Lab Results  Component Value Date   HDL 38 (L) 04/18/2019   Lab Results  Component Value Date   LDLCALC 77 04/18/2019   Lab Results  Component Value Date   TRIG 102 04/18/2019   Lab Results  Component Value Date   CHOLHDL 3.5 04/18/2019   Lab Results  Component Value Date   HGBA1C 8.4 (A) 01/22/2020      Assessment & Plan:   Problem List Items Addressed This Visit      Cardiovascular and Mediastinum   Essential hypertension    Well controlled. Continue current regimen. Follow up in  3-4 months.         Endocrine   Diabetes mellitus type 2 with complications ([place]) - Primary    And if she was a little bit disappointed that her sugars were significantly better but she is moving in the right direction.  A1C is better today at 8.4.  We discussed adding mealtime insulin for all 3 meals.  Since she is working from home she feels like this is more doable.  So we will have her do 5 units before breakfast and then again at lunch.  And then  increase her evening dose to 10 units.  Continue with Basaglar 80 units twice a day in addition to her Kombiglyze.  She says she is almost out of the 90-day supply and then when she runs out we may need to try just switching her to regular Metformin.  She has a history of pancreatitis.      Relevant Orders   POCT glycosylated hemoglobin (Hb A1C) (Completed)     Other   Class 2 obesity due to excess calories without serious comorbidity with body mass index (BMI) of 36.0 to 36.9 in adult    She is planning on getting back onto a good regimen with her diet also encouraged her to really work on increasing her activity levels.  Just reminded her that this will make a big difference in her hemoglobin A1c.         No orders of the defined types were placed in this encounter.   Follow-up: Return in about 3 months (around 04/23/2020) for Diabetes follow-up.    Beatrice Lecher, MD

## 2020-01-22 NOTE — Assessment & Plan Note (Signed)
She is planning on getting back onto a good regimen with her diet also encouraged her to really work on increasing her activity levels.  Just reminded her that this will make a big difference in her hemoglobin A1c.

## 2020-01-22 NOTE — Assessment & Plan Note (Addendum)
And if she was a little bit disappointed that her sugars were significantly better but she is moving in the right direction.  A1C is better today at 8.4.  We discussed adding mealtime insulin for all 3 meals.  Since she is working from home she feels like this is more doable.  So we will have her do 5 units before breakfast and then again at lunch.  And then increase her evening dose to 10 units.  Continue with Basaglar 80 units twice a day in addition to her Kombiglyze.  She says she is almost out of the 90-day supply and then when she runs out we may need to try just switching her to regular Metformin.  She has a history of pancreatitis.

## 2020-01-22 NOTE — Progress Notes (Signed)
She brought in her glucose meter 7-day= 216 14-day= 205 30-day= 193

## 2020-01-22 NOTE — Patient Instructions (Addendum)
Increase you mealtime insulin to 5 units before breakfast, 5 units before lunch and up you evening meal dose to 10 units.    Call with your blood sugars in about 2-3 weeks.

## 2020-01-23 ENCOUNTER — Other Ambulatory Visit: Payer: Self-pay | Admitting: Family Medicine

## 2020-04-16 ENCOUNTER — Other Ambulatory Visit: Payer: Self-pay | Admitting: Family Medicine

## 2020-04-16 DIAGNOSIS — E118 Type 2 diabetes mellitus with unspecified complications: Secondary | ICD-10-CM

## 2020-04-17 ENCOUNTER — Other Ambulatory Visit: Payer: Self-pay | Admitting: Family Medicine

## 2020-04-26 ENCOUNTER — Encounter: Payer: Self-pay | Admitting: Family Medicine

## 2020-04-26 ENCOUNTER — Other Ambulatory Visit: Payer: Self-pay

## 2020-04-26 ENCOUNTER — Ambulatory Visit (INDEPENDENT_AMBULATORY_CARE_PROVIDER_SITE_OTHER): Payer: Managed Care, Other (non HMO) | Admitting: Family Medicine

## 2020-04-26 VITALS — BP 131/71 | HR 103 | Ht 61.0 in | Wt 191.0 lb

## 2020-04-26 DIAGNOSIS — R202 Paresthesia of skin: Secondary | ICD-10-CM

## 2020-04-26 DIAGNOSIS — I10 Essential (primary) hypertension: Secondary | ICD-10-CM | POA: Diagnosis not present

## 2020-04-26 DIAGNOSIS — Z720 Tobacco use: Secondary | ICD-10-CM | POA: Diagnosis not present

## 2020-04-26 DIAGNOSIS — M791 Myalgia, unspecified site: Secondary | ICD-10-CM

## 2020-04-26 DIAGNOSIS — M545 Low back pain, unspecified: Secondary | ICD-10-CM

## 2020-04-26 DIAGNOSIS — E118 Type 2 diabetes mellitus with unspecified complications: Secondary | ICD-10-CM

## 2020-04-26 DIAGNOSIS — R200 Anesthesia of skin: Secondary | ICD-10-CM

## 2020-04-26 DIAGNOSIS — G8929 Other chronic pain: Secondary | ICD-10-CM

## 2020-04-26 DIAGNOSIS — M2550 Pain in unspecified joint: Secondary | ICD-10-CM

## 2020-04-26 LAB — POCT GLYCOSYLATED HEMOGLOBIN (HGB A1C): Hemoglobin A1C: 8 % — AB (ref 4.0–5.6)

## 2020-04-26 MED ORDER — METFORMIN HCL ER 500 MG PO TB24: 1000.0000 mg | ORAL_TABLET | Freq: Every day | ORAL | 1 refills | Status: DC

## 2020-04-26 NOTE — Progress Notes (Signed)
Established Patient Office Visit  Subjective:  Patient ID: Terri Zimmerman, female    DOB: 01/19/1966  Age: 54 y.o. MRN: [id number]  CC:  Chief Complaint  Patient presents with   Diabetes   Hypertension    HPI Terri Zimmerman presents for   Diabetes - no hypoglycemic events. No wounds or sores that are not healing well. No increased thirst or urination. Checking glucose at home. Taking medications as prescribed without any side effects.  Hypertension- Pt denies chest pain, SOB, dizziness, or heart palpitations.  Taking meds as directed w/o problems.  Denies medication side effects.    Feels achey all over. She feels it in her bones and muscles. Says sometimes it is actually difficult to get up out of a chair.  She has noticed that she feels like her legs will most want to give out on her especially on the right compared to the left.  She says she feels pain going down both legs.  Is not located laterally or medially its over the entire legs.  She says she feels like she is getting numbness in her small toes in both feet.  It seems to be worse at night.  Mostly just been using Tylenol but it has been disruptive to her sleep.  Past Medical History:  Diagnosis Date   Asthma 04-25-07   Diabetes mellitus 11-12-06   type 2   Hypertension 11-04-06    Past Surgical History:  Procedure Laterality Date   ABDOMINAL HYSTERECTOMY  12-11   Complete   APPENDECTOMY  9-00   BREAST BIOPSY Left    BREAST CYST EXCISION Left    BREAST SURGERY  4-00   of cyst   FOOT TENDON SURGERY  05/16/2012   bone spur.     LAPAROSCOPY  4-08   for cyst and endometriosis   left breast bx  12-11    Family History  Problem Relation Age of Onset   Other Mother        RA and CHF   Diabetes Father    Diabetes Other    Heart disease Other     Social History   Socioeconomic History   Marital status: Married    Spouse name: Not on file   Number of children: Not on file   Years of  education: Not on file   Highest education level: Not on file  Occupational History   Not on file  Tobacco Use   Smoking status: Current Every Day Smoker    Packs/day: 0.50    Types: Cigarettes   Smokeless tobacco: Never Used  Vaping Use   Vaping Use: Never used  Substance and Sexual Activity   Alcohol use: No   Drug use: No   Sexual activity: Not on file  Other Topics Concern   Not on file  Social History Narrative   Not on file   Social Determinants of Health   Financial Resource Strain:    Difficulty of Paying Living Expenses:   Food Insecurity:    Worried About Charity fundraiser in the Last Year:    Arboriculturist in the Last Year:   Transportation Needs:    Film/video editor (Medical):    Lack of Transportation (Non-Medical):   Physical Activity:    Days of Exercise per Week:    Minutes of Exercise per Session:   Stress:    Feeling of Stress :   Social Connections:    Frequency of Communication with  Friends and Family:    Frequency of Social Gatherings with Friends and Family:    Attends Religious Services:    Active Member of Clubs or Organizations:    Attends Music therapist:    Marital Status:   Intimate Partner Violence:    Fear of Current or Ex-Partner:    Emotionally Abused:    Physically Abused:    Sexually Abused:     Outpatient Medications Prior to Visit  Medication Sig Dispense Refill   acetaminophen (TYLENOL) 325 MG tablet Take 650 mg by mouth 2 (two) times daily.     AMBULATORY NON FORMULARY MEDICATION 1 each by Other route 2 (two) times daily. Lancets for daily glucose checks Diagnosis: E11.9 Testing 2 times a day 200 each 5   glucose blood (ONE TOUCH ULTRA TEST) test strip USE TO TEST BLOOD SUGARS 2 TIMES A DAY. DX:E11.8 200 each 11   Insulin Glargine (BASAGLAR KWIKPEN) 100 UNIT/ML INJECT 80 UNITS INTO THE SKIN TWICE DAILY. DO NOT MIX WITH OTHER INSULINS 20 pen 38   insulin lispro (HUMALOG  KWIKPEN) 100 UNIT/ML KwikPen 5-10 units [place] before evening meal. 15 mL 3   Insulin Pen Needle (B-D UF III MINI PEN NEEDLES) 31G X 5 MM MISC USE TO INJECT INSULIN UP TO THREE (3) TIMES DAILY. DIAGNOSIS DM E11.9 1 each 33   lisinopril (ZESTRIL) 20 MG tablet TAKE 1 TABLET BY MOUTH EVERY DAY 90 tablet 1   metoprolol succinate (TOPROL-XL) 25 MG 24 hr tablet TAKE 1 TABLET BY MOUTH EVERYDAY AT BEDTIME 90 tablet 1   OneTouch Delica Lancets 01V MISC UAD AS DIRECTED     pravastatin (PRAVACHOL) 20 MG tablet TAKE 1 TABLET BY MOUTH EVERY DAY 90 tablet 3   KOMBIGLYZE XR 03-999 MG TB24 TAKE 1 TABLET BY MOUTH EVERY DAY 90 tablet 1   No facility-administered medications prior to visit.    Allergies  Allergen Reactions   Trulicity [Dulaglutide] Nausea Only    Abdominal pain,   Farxiga [Dapagliflozin]     foggy   Invokana [Canagliflozin] Other (See Comments)    Back pain   Jentadueto [Linagliptin-Metformin Hcl Er]     Abdominal pains and diarrhea   Lipitor [Atorvastatin]     Sudden hair loss 2 weeks after starting lipitor.    Onglyza [Saxagliptin] Other (See Comments)    Pancreatitis   Ozempic (0.25 Or 0.5 Mg-Dose) [Semaglutide(0.25 Or 0.5mg -Dos)] Diarrhea and Other (See Comments)    Abdominal pain   Victoza [Liraglutide] Other (See Comments)    Abdominal pain and nausea.     ROS Review of Systems    Objective:    Physical Exam Constitutional:      Appearance: She is well-developed.  HENT:     Head: Normocephalic and atraumatic.  Cardiovascular:     Rate and Rhythm: Normal rate and regular rhythm.     Heart sounds: Normal heart sounds.  Pulmonary:     Effort: Pulmonary effort is normal.     Breath sounds: Normal breath sounds.  Musculoskeletal:     Comments: Patellar reflexes 2+.  Skin:    General: Skin is warm and dry.  Neurological:     Mental Status: She is alert and oriented to person, place, and time.  Psychiatric:        Behavior: Behavior normal.     BP  131/71    Pulse (!) 103    Ht 5\' 1"  (1.549 m)    Wt 191 lb (86.6 kg)  SpO2 97%    BMI 36.09 kg/m  Wt Readings from Last 3 Encounters:  04/26/20 191 lb (86.6 kg)  01/22/20 191 lb (86.6 kg)  12/11/19 191 lb (86.6 kg)     Health Maintenance Due  Topic Date Due   Hepatitis C Screening  Never done    There are no preventive care reminders to display for this patient.  Lab Results  Component Value Date   TSH 1.30 08/20/2016   Lab Results  Component Value Date   WBC 12.9 (H) 06/13/2019   HGB 14.5 06/13/2019   HCT 43.9 06/13/2019   MCV 84.9 06/13/2019   PLT 295 06/13/2019   Lab Results  Component Value Date   NA 141 10/27/2019   K 4.3 10/27/2019   CO2 27 10/27/2019   GLUCOSE 229 (H) 10/27/2019   BUN 10 10/27/2019   CREATININE 0.70 10/27/2019   BILITOT 0.3 06/10/2019   ALKPHOS 101 06/10/2019   AST 19 06/10/2019   ALT 19 06/10/2019   PROT 7.1 06/10/2019   ALBUMIN 3.9 06/10/2019   CALCIUM 9.8 10/27/2019   ANIONGAP 12 06/10/2019   Lab Results  Component Value Date   CHOL 134 04/18/2019   Lab Results  Component Value Date   HDL 38 (L) 04/18/2019   Lab Results  Component Value Date   LDLCALC 77 04/18/2019   Lab Results  Component Value Date   TRIG 102 04/18/2019   Lab Results  Component Value Date   CHOLHDL 3.5 04/18/2019   Lab Results  Component Value Date   HGBA1C 8.0 (A) 04/26/2020      Assessment & Plan:   Problem List Items Addressed This Visit      Cardiovascular and Mediastinum   Essential hypertension    Well controlled. Continue current regimen. Follow up in  6 mo      Relevant Orders   POCT glycosylated hemoglobin (Hb A1C) (Completed)   COMPLETE METABOLIC PANEL WITH GFR   Lipid panel     Endocrine   Diabetes mellitus type 2 with complications ([place]) - Primary    Improved but not well controlled.  A1c down to 8.0.  She has not been consistent with her mealtime insulin so we discussed at least getting breakfast and she has been  consistent with dinnertime.  Start with 5 units before breakfast and if blood sugars are not running low then try to go up to 8 units.  Continue with Basaglar 80 units twice a day.  We will get a discontinue Kombiglyze because of her history of pancreatitis and just switch to plain Metformin.  ON ACE and statin.  Recheck lipids      Relevant Medications   metFORMIN (GLUCOPHAGE-XR) 500 MG 24 hr tablet   Other Relevant Orders   POCT glycosylated hemoglobin (Hb A1C) (Completed)   COMPLETE METABOLIC PANEL WITH GFR   Lipid panel     Other   Tobacco abuse   Chronic midline low back pain without sciatica   Relevant Orders   MR Lumbar Spine Wo Contrast    Other Visit Diagnoses    Myalgia       Relevant Medications   metFORMIN (GLUCOPHAGE-XR) 500 MG 24 hr tablet   Other Relevant Orders   COMPLETE METABOLIC PANEL WITH GFR   Lipid panel   CBC with Differential/Platelet   TSH   Sedimentation rate   B12   Vitamin B6   Rheumatoid factor   ANA   Cyclic citrul peptide antibody, IgG   Numbness  and tingling of both feet       Relevant Medications   metFORMIN (GLUCOPHAGE-XR) 500 MG 24 hr tablet   Other Relevant Orders   COMPLETE METABOLIC PANEL WITH GFR   Lipid panel   CBC with Differential/Platelet   TSH   Sedimentation rate   B12   Vitamin B6   Rheumatoid factor   ANA   Cyclic citrul peptide antibody, IgG   Polyarthralgia       Relevant Medications   metFORMIN (GLUCOPHAGE-XR) 500 MG 24 hr tablet   Other Relevant Orders   COMPLETE METABOLIC PANEL WITH GFR   Lipid panel   CBC with Differential/Platelet   TSH   Sedimentation rate   B12   Vitamin B6   Rheumatoid factor   ANA   Cyclic citrul peptide antibody, IgG   MR Lumbar Spine Wo Contrast      Polyarthralgia-unclear etiology we did discuss stopping her statin for at least 2 to 3 weeks to see if this is helpful.  Like to see her back at that time to make sure that she is improving also do some additional lab work to rule  out deficiencies, thyroid disorder, anemia, electrolyte imbalance etc.  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  But I am concerned more about the weakness she does have chronic low back pain with sciatica.  We will move forward with MRI for further work-up.  Chronic low back pain-again I think this could be contributing to her leg pain and may be even some of the increase in paresthesias so would like to go ahead and move forward with MRI for further work-up.  Meds ordered this encounter  Medications   metFORMIN (GLUCOPHAGE-XR) 500 MG 24 hr tablet    Sig: Take 2 tablets (1,000 mg total) by mouth daily with breakfast.    Dispense:  180 tablet    Refill:  1    Follow-up: Return in about 3 weeks (around 05/17/2020) for Muscle and joint pain.Beatrice Lecher, MD

## 2020-04-26 NOTE — Assessment & Plan Note (Signed)
Well controlled. Continue current regimen. Follow up in  6 mo  

## 2020-04-26 NOTE — Assessment & Plan Note (Addendum)
Improved but not well controlled.  A1c down to 8.0.  She has not been consistent with her mealtime insulin so we discussed at least getting breakfast and she has been consistent with dinnertime.  Start with 5 units before breakfast and if blood sugars are not running low then try to go up to 8 units.  Continue with Basaglar 80 units twice a day.  We will get a discontinue Kombiglyze because of her history of pancreatitis and just switch to plain Metformin.  ON ACE and statin.  Recheck lipids

## 2020-05-02 LAB — CBC WITH DIFFERENTIAL/PLATELET
Absolute Monocytes: 511 cells/uL (ref 200–950)
Basophils Absolute: 52 cells/uL (ref 0–200)
Basophils Relative: 0.4 %
Eosinophils Absolute: 144 cells/uL (ref 15–500)
Eosinophils Relative: 1.1 %
HCT: 45.9 % — ABNORMAL HIGH (ref 35.0–45.0)
Hemoglobin: 15 g/dL (ref 11.7–15.5)
Lymphs Abs: 4310 cells/uL — ABNORMAL HIGH (ref 850–3900)
MCH: 27.8 pg (ref 27.0–33.0)
MCHC: 32.7 g/dL (ref 32.0–36.0)
MCV: 85 fL (ref 80.0–100.0)
MPV: 10.6 fL (ref 7.5–12.5)
Monocytes Relative: 3.9 %
Neutro Abs: 8083 cells/uL — ABNORMAL HIGH (ref 1500–7800)
Neutrophils Relative %: 61.7 %
Platelets: 277 10*3/uL (ref 140–400)
RBC: 5.4 10*6/uL — ABNORMAL HIGH (ref 3.80–5.10)
RDW: 12.8 % (ref 11.0–15.0)
Total Lymphocyte: 32.9 %
WBC: 13.1 10*3/uL — ABNORMAL HIGH (ref 3.8–10.8)

## 2020-05-02 LAB — LIPID PANEL
Cholesterol: 116 mg/dL (ref <200)
HDL: 39 mg/dL — ABNORMAL LOW (ref >=50)
LDL Cholesterol (Calc): 52 mg/dL (calc)
Non-HDL Cholesterol (Calc): 77 mg/dL (calc) (ref <130)
Total CHOL/HDL Ratio: 3 (calc) (ref <5.0)
Triglycerides: 168 mg/dL — ABNORMAL HIGH (ref <150)

## 2020-05-02 LAB — COMPLETE METABOLIC PANEL WITH GFR
AG Ratio: 1.7 (calc) (ref 1.0–2.5)
ALT: 13 U/L (ref 6–29)
AST: 15 U/L (ref 10–35)
Albumin: 4 g/dL (ref 3.6–5.1)
Alkaline phosphatase (APISO): 91 U/L (ref 37–153)
BUN: 11 mg/dL (ref 7–25)
CO2: 30 mmol/L (ref 20–32)
Calcium: 10.1 mg/dL (ref 8.6–10.4)
Chloride: 104 mmol/L (ref 98–110)
Creat: 0.69 mg/dL (ref 0.50–1.05)
GFR, Est African American: 114 mL/min/{1.73_m2} (ref >=60)
GFR, Est Non African American: 99 mL/min/{1.73_m2} (ref >=60)
Globulin: 2.3 g/dL (calc) (ref 1.9–3.7)
Glucose, Bld: 89 mg/dL (ref 65–99)
Potassium: 4.1 mmol/L (ref 3.5–5.3)
Sodium: 142 mmol/L (ref 135–146)
Total Bilirubin: 0.3 mg/dL (ref 0.2–1.2)
Total Protein: 6.3 g/dL (ref 6.1–8.1)

## 2020-05-02 LAB — VITAMIN B6: Vitamin B6: 15.8 ng/mL (ref 2.1–21.7)

## 2020-05-02 LAB — TSH: TSH: 1.68 mIU/L

## 2020-05-02 LAB — VITAMIN B12: Vitamin B-12: 341 pg/mL (ref 200–1100)

## 2020-05-02 LAB — ANTI-NUCLEAR AB-TITER (ANA TITER): ANA Titer 1: 1:40 {titer} — ABNORMAL HIGH

## 2020-05-02 LAB — CYCLIC CITRUL PEPTIDE ANTIBODY, IGG: Cyclic Citrullin Peptide Ab: <16 UNITS

## 2020-05-02 LAB — RHEUMATOID FACTOR: Rheumatoid fact SerPl-aCnc: <14 IU/mL (ref <14)

## 2020-05-02 LAB — SEDIMENTATION RATE: Sed Rate: 9 mm/h (ref 0–30)

## 2020-05-02 LAB — ANA: Anti Nuclear Antibody (ANA): POSITIVE — AB

## 2020-05-03 ENCOUNTER — Telehealth: Payer: Self-pay | Admitting: Family Medicine

## 2020-05-06 ENCOUNTER — Telehealth: Payer: Self-pay

## 2020-05-06 NOTE — Telephone Encounter (Signed)
Received notice from [facility], MRI was denied due to not specif last 6 weeks of treatment with follow up. I called insurance and if we schedule virtual visit 6 weeks from date this was ordered (06/10/20) and document all that she has tried and failed for last 6 weeks for treatment, I can resubmit to insurance and attempt to get approval.   I have called and made patient aware. We went ahead and schedule a 6 week follow up to discuss treatments attempted and failed for back pan. I have deferred imaging order until that time, so nothing additional will be needed from provider.   FYI of status update

## 2020-05-06 NOTE — Telephone Encounter (Signed)
Agree with documentation as above.  WE can Try formal physical therapy or at least some home therapy we can always mail her the exercises.  Beatrice Lecher, MD

## 2020-05-08 NOTE — Telephone Encounter (Signed)
rror

## 2020-05-09 ENCOUNTER — Encounter: Payer: Self-pay | Admitting: Family Medicine

## 2020-05-10 ENCOUNTER — Other Ambulatory Visit: Payer: Self-pay | Admitting: Family Medicine

## 2020-05-10 MED ORDER — FLUCONAZOLE 150 MG PO TABS
150.0000 mg | ORAL_TABLET | Freq: Once | ORAL | 0 refills | Status: AC
Start: 2020-05-10 — End: 2020-05-10

## 2020-05-16 ENCOUNTER — Encounter: Payer: Self-pay | Admitting: Family Medicine

## 2020-05-24 ENCOUNTER — Ambulatory Visit: Payer: Managed Care, Other (non HMO) | Admitting: Family Medicine

## 2020-06-10 ENCOUNTER — Telehealth (INDEPENDENT_AMBULATORY_CARE_PROVIDER_SITE_OTHER): Payer: Managed Care, Other (non HMO) | Admitting: Family Medicine

## 2020-06-10 ENCOUNTER — Encounter: Payer: Self-pay | Admitting: Family Medicine

## 2020-06-10 VITALS — BP 157/89 | HR 92 | Ht 61.0 in | Wt 191.0 lb

## 2020-06-10 DIAGNOSIS — M545 Pain in left leg: Secondary | ICD-10-CM

## 2020-06-10 DIAGNOSIS — M79604 Pain in right leg: Secondary | ICD-10-CM

## 2020-06-10 DIAGNOSIS — M791 Myalgia, unspecified site: Secondary | ICD-10-CM | POA: Diagnosis not present

## 2020-06-10 DIAGNOSIS — R768 Other specified abnormal immunological findings in serum: Secondary | ICD-10-CM | POA: Diagnosis not present

## 2020-06-10 NOTE — Progress Notes (Signed)
Back pain - better, occasional muscle pain, still having some episodes of pain but not like it was Resting and taking tylenol

## 2020-06-10 NOTE — Progress Notes (Signed)
Virtual Visit via Video Note  I connected with Terri Zimmerman on 06/10/20 at  9:10 AM EDT by a video enabled telemedicine application and verified that I am speaking with the correct person using two identifiers.   I discussed the limitations of evaluation and management by telemedicine and the availability of in person appointments. The patient expressed understanding and agreed to proceed.  Patient location: at home Provider location: in office  Subjective:    CC: Low back pain.   HPI: 54 year old female following up on her back pain.  She just feels like at times her legs want to give out the most feel weak.  She was previously feeling some paresthesias and numbness and tingling as well.  She also was feeling achy all over the last time I saw her she had been using some Tylenol and still has been.  Also occasionally using NyQuil because it seems to help her sleep.  She feels like the numbness and tingling in the feet is a little better than it was.  And she feels like overall the back pain again is a little bit better but she still having intermittent episodes of sharp sudden pain.  She says in fact a couple nights ago she tried to turn over in bed lifted her leg and got a sudden sharp pain in her low back.  It lasted about 15 minutes before it finally eased off enough for her to be able to finish moving.  Has been trying to change position more frequently and avoid prolonged sitting she is been trying to walk a little bit more and doing stretches in the evenings to help with her back.  Has noticed recently some tingling in her hands bilaterally.  She says it happened last week once and then even this morning feels a little bit of tingling in her right hand she is not had this before.  He does feel like the overall achiness has actually gotten better as well though not completely resolved.   Past medical history, Surgical history, Family history not pertinant except as noted below, Social  history, Allergies, and medications have been entered into the medical record, reviewed, and corrections made.   Review of Systems: No fevers, chills, night sweats, weight loss, chest pain, or shortness of breath.   Objective:    General: Speaking clearly in complete sentences without any shortness of breath.  Alert and oriented x3.  Normal judgment. No apparent acute distress.    Impression and Recommendations:    No problem-specific Assessment & Plan notes found for this encounter.  Persistent low back pain-she has had some improvement in the tingling and numbness in her feet.  But still having persistent pain pain with sudden onset of more severe pain periodically.  We will go ahead and move forward with MRI for further evaluation.  Continue Tylenol as needed.  Also discussed formal physical therapy is a possibility but right now she cannot afford it.  Elevated ANA-discussed her results from labs in June.  ANA was borderline at 1:40 ratio.  Discussed that we can plan to recheck again in 6 to 12 months.  No malar rash history.  And myalgias seem to have improved actually.  Myalgias-improved.  If they persist or recur we can always check a CK level.   Time spent in encounter 21 minutes  I discussed the assessment and treatment plan with the patient. The patient was provided an opportunity to ask questions and all were answered. The patient agreed  with the plan and demonstrated an understanding of the instructions.   The patient was advised to call back or seek an in-person evaluation if the symptoms worsen or if the condition fails to improve as anticipated.   Beatrice Lecher, MD

## 2020-06-12 ENCOUNTER — Telehealth: Payer: Self-pay

## 2020-06-12 NOTE — Telephone Encounter (Signed)
Called patient to check on back pain.  Patient confirms that she has been doing home exercises and stretches that were provided by provider on 04/26/20 office visit.   Patient has been doing exercises as well as taking Tylenol, Advil, and Aleve at home with no relief. Has to take Nyquil every night before bed to be able to sleep through back pain. Using heating pad regularly. Has had several instances of locking of left and right legs and also has had her legs "give out" due to inability to bear weight. Complaints of radiating pain and numbness down both legs.   Does wish to proceed with Lumbar MRI

## 2020-06-30 ENCOUNTER — Other Ambulatory Visit: Payer: Self-pay

## 2020-06-30 ENCOUNTER — Ambulatory Visit (INDEPENDENT_AMBULATORY_CARE_PROVIDER_SITE_OTHER): Payer: Managed Care, Other (non HMO)

## 2020-06-30 DIAGNOSIS — M545 Low back pain, unspecified: Secondary | ICD-10-CM

## 2020-06-30 DIAGNOSIS — G8929 Other chronic pain: Secondary | ICD-10-CM | POA: Diagnosis not present

## 2020-06-30 DIAGNOSIS — M2550 Pain in unspecified joint: Secondary | ICD-10-CM | POA: Diagnosis not present

## 2020-07-01 ENCOUNTER — Other Ambulatory Visit: Payer: Self-pay | Admitting: Family Medicine

## 2020-07-01 DIAGNOSIS — Z1231 Encounter for screening mammogram for malignant neoplasm of breast: Secondary | ICD-10-CM

## 2020-07-02 ENCOUNTER — Encounter: Payer: Self-pay | Admitting: Family Medicine

## 2020-07-02 ENCOUNTER — Ambulatory Visit (INDEPENDENT_AMBULATORY_CARE_PROVIDER_SITE_OTHER): Payer: Managed Care, Other (non HMO) | Admitting: Family Medicine

## 2020-07-02 DIAGNOSIS — R21 Rash and other nonspecific skin eruption: Secondary | ICD-10-CM

## 2020-07-02 MED ORDER — HYDROCORTISONE 2.5 % EX CREA: TOPICAL_CREAM | Freq: Two times a day (BID) | CUTANEOUS | 0 refills | Status: DC

## 2020-07-02 NOTE — Assessment & Plan Note (Signed)
Rash with appearance consistent with contact vs atopic dermatitis.  Will have her try 2.5% hydrocortisone cream to affected area.   Does not appear to be typical malar rash that would be associated with SLE.   She is instructed to return if not improving in the next couple of weeks.

## 2020-07-02 NOTE — Progress Notes (Signed)
Terri Zimmerman - 54 y.o. female MRN [id number]  Date of birth: Nov 07, 1966  Subjective Chief Complaint  Patient presents with  . Rash    HPI Terri Zimmerman is a 54 y.o. female here today with complaint of rash.  Rash is located on on neck and face including cheeks and eyelids.  It has been present for about 2 weeks.  She does have some itching associated with this.  She denies any changes to medications, new detergents, soaps, lotions, sunscreens, etc.  She does report that she had positive ANA a couple of months ago.  She has not tried anything for this so far.   ROS:  A comprehensive ROS was completed and negative except as noted per HPI  Allergies  Allergen Reactions  . Trulicity [Dulaglutide] Nausea Only    Abdominal pain,  . Farxiga [Dapagliflozin]     foggy  . Invokana [Canagliflozin] Other (See Comments)    Back pain  . Jentadueto [Linagliptin-Metformin Hcl Er]     Abdominal pains and diarrhea  . Lipitor [Atorvastatin]     Sudden hair loss 2 weeks after starting lipitor.   Lance Muss [Saxagliptin] Other (See Comments)    Pancreatitis  . Ozempic (0.25 Or 0.5 Mg-Dose) [Semaglutide(0.25 Or 0.5mg -Dos)] Diarrhea and Other (See Comments)    Abdominal pain  . Victoza [Liraglutide] Other (See Comments)    Abdominal pain and nausea.     Past Medical History:  Diagnosis Date  . Asthma 04-25-07  . Diabetes mellitus 11-12-06   type 2  . Hypertension 11-04-06    Past Surgical History:  Procedure Laterality Date  . ABDOMINAL HYSTERECTOMY  12-11   Complete  . APPENDECTOMY  9-00  . BREAST BIOPSY Left   . BREAST CYST EXCISION Left   . BREAST SURGERY  4-00   of cyst  . FOOT TENDON SURGERY  05/16/2012   bone spur.    Marland Kitchen LAPAROSCOPY  4-08   for cyst and endometriosis  . left breast bx  12-11    Social History   Socioeconomic History  . Marital status: Married    Spouse name: Not on file  . Number of children: Not on file  . Years of education: Not on file  . Highest education  level: Not on file  Occupational History  . Not on file  Tobacco Use  . Smoking status: Current Every Day Smoker    Packs/day: 0.50    Types: Cigarettes  . Smokeless tobacco: Never Used  Vaping Use  . Vaping Use: Never used  Substance and Sexual Activity  . Alcohol use: No  . Drug use: No  . Sexual activity: Not on file  Other Topics Concern  . Not on file  Social History Narrative  . Not on file   Social Determinants of Health   Financial Resource Strain:   . Difficulty of Paying Living Expenses:   Food Insecurity:   . Worried About Charity fundraiser in the Last Year:   . Arboriculturist in the Last Year:   Transportation Needs:   . Film/video editor (Medical):   Marland Kitchen Lack of Transportation (Non-Medical):   Physical Activity:   . Days of Exercise per Week:   . Minutes of Exercise per Session:   Stress:   . Feeling of Stress :   Social Connections:   . Frequency of Communication with Friends and Family:   . Frequency of Social Gatherings with Friends and Family:   . Attends  Religious Services:   . Active Member of Clubs or Organizations:   . Attends Archivist Meetings:   Marland Kitchen Marital Status:     Family History  Problem Relation Age of Onset  . Other Mother        RA and CHF  . Diabetes Father   . Diabetes Other   . Heart disease Other     Health Maintenance  Topic Date Due  . Hepatitis C Screening  Never done  . OPHTHALMOLOGY EXAM  09/16/2020 (Originally 05/11/2020)  . INFLUENZA VACCINE  02/13/2021 (Originally 06/16/2020)  . HEMOGLOBIN A1C  10/26/2020  . FOOT EXAM  12/10/2020  . MAMMOGRAM  07/25/2021  . Fecal DNA (Cologuard)  06/07/2022  . TETANUS/TDAP  11/30/2026  . PNEUMOCOCCAL POLYSACCHARIDE VACCINE AGE 47-64 HIGH RISK  Completed  . COVID-19 Vaccine  Completed  . HIV Screening  Completed      ----------------------------------------------------------------------------------------------------------------------------------------------------------------------------------------------------------------- Physical Exam BP 120/76 (BP Location: Left Arm, Patient Position: Sitting, Cuff Size: Normal)   Pulse (!) 103   Temp 98.1 F (36.7 C) (Oral)   Wt 192 lb 1.3 oz (87.1 kg)   BMI 36.29 kg/m   Physical Exam Constitutional:      Appearance: Normal appearance.  HENT:     Head: Normocephalic and atraumatic.  Skin:    Comments: Erythema of L side of neck, upper eyelid and brow area, nasolabial fold.   Skin of neck is somewhat rough textured with some scaling.   Neurological:     General: No focal deficit present.     Mental Status: She is alert.  Psychiatric:        Mood and Affect: Mood normal.        Behavior: Behavior normal.     ------------------------------------------------------------------------------------------------------------------------------------------------------------------------------------------------------------------- Assessment and Plan  Rash Rash with appearance consistent with contact vs atopic dermatitis.  Will have her try 2.5% hydrocortisone cream to affected area.   Does not appear to be typical malar rash that would be associated with SLE.   She is instructed to return if not improving in the next couple of weeks.    Meds ordered this encounter  Medications  . hydrocortisone 2.5 % cream    Sig: Apply topically 2 (two) times daily. Avoid getting in eyes    Dispense:  75 g    Refill:  0    No follow-ups on file.    This visit occurred during the SARS-CoV-2 public health emergency.  Safety protocols were in place, including screening questions prior to the visit, additional usage of staff PPE, and extensive cleaning of exam room while observing appropriate contact time as indicated for disinfecting solutions.

## 2020-07-02 NOTE — Patient Instructions (Signed)
Contact Dermatitis °Dermatitis is redness, soreness, and swelling (inflammation) of the skin. Contact dermatitis is a reaction to something that touches the skin. °There are two types of contact dermatitis: °· Irritant contact dermatitis. This happens when something bothers (irritates) your skin, like soap. °· Allergic contact dermatitis. This is caused when you are exposed to something that you are allergic to, such as poison ivy. °What are the causes? °· Common causes of irritant contact dermatitis include: °? Makeup. °? Soaps. °? Detergents. °? Bleaches. °? Acids. °? Metals, such as nickel. °· Common causes of allergic contact dermatitis include: °? Plants. °? Chemicals. °? Jewelry. °? Latex. °? Medicines. °? Preservatives in products, such as clothing. °What increases the risk? °· Having a job that exposes you to things that bother your skin. °· Having asthma or eczema. °What are the signs or symptoms? °Symptoms may happen anywhere the irritant has touched your skin. Symptoms include: °· Dry or flaky skin. °· Redness. °· Cracks. °· Itching. °· Pain or a burning feeling. °· Blisters. °· Blood or clear fluid draining from skin cracks. °With allergic contact dermatitis, swelling may occur. This may happen in places such as the eyelids, mouth, or genitals. °How is this treated? °· This condition is treated by checking for the cause of the reaction and protecting your skin. Treatment may also include: °? Steroid creams, ointments, or medicines. °? Antibiotic medicines or other ointments, if you have a skin infection. °? Lotion or medicines to help with itching. °? A bandage (dressing). °Follow these instructions at home: °Skin care °· Moisturize your skin as needed. °· Put cool cloths on your skin. °· Put a baking soda paste on your skin. Stir water into baking soda until it looks like a paste. °· Do not scratch your skin. °· Avoid having things rub up against your skin. °· Avoid the use of soaps, perfumes, and  dyes. °Medicines °· Take or apply over-the-counter and prescription medicines only as told by your doctor. °· If you were prescribed an antibiotic medicine, take or apply it as told by your doctor. Do not stop using it even if your condition starts to get better. °Bathing °· Take a bath with: °? Epsom salts. °? Baking soda. °? Colloidal oatmeal. °· Bathe less often. °· Bathe in warm water. Avoid using hot water. °Bandage care °· If you were given a bandage, change it as told by your health care provider. °· Wash your hands with soap and water before and after you change your bandage. If soap and water are not available, use hand sanitizer. °General instructions °· Avoid the things that caused your reaction. If you do not know what caused it, keep a journal. Write down: °? What you eat. °? What skin products you use. °? What you drink. °? What you wear in the area that has symptoms. This includes jewelry. °· Check the affected areas every day for signs of infection. Check for: °? More redness, swelling, or pain. °? More fluid or blood. °? Warmth. °? Pus or a bad smell. °· Keep all follow-up visits as told by your doctor. This is important. °Contact a doctor if: °· You do not get better with treatment. °· Your condition gets worse. °· You have signs of infection, such as: °? More swelling. °? Tenderness. °? More redness. °? Soreness. °? Warmth. °· You have a fever. °· You have new symptoms. °Get help right away if: °· You have a very bad headache. °· You have neck pain. °·   Your neck is stiff. °· You throw up (vomit). °· You feel very sleepy. °· You see red streaks coming from the area. °· Your bone or joint near the area hurts after the skin has healed. °· The area turns darker. °· You have trouble breathing. °Summary °· Dermatitis is redness, soreness, and swelling of the skin. °· Symptoms may occur where the irritant has touched you. °· Treatment may include medicines and skin care. °· If you do not know what caused  your reaction, keep a journal. °· Contact a doctor if your condition gets worse or you have signs of infection. °This information is not intended to replace advice given to you by your health care provider. Make sure you discuss any questions you have with your health care provider. °Document Revised: 02/22/2019 Document Reviewed: 05/18/2018 °Elsevier Patient Education © 2020 [employer] ° °

## 2020-07-29 ENCOUNTER — Ambulatory Visit (INDEPENDENT_AMBULATORY_CARE_PROVIDER_SITE_OTHER): Payer: Managed Care, Other (non HMO) | Admitting: Family Medicine

## 2020-07-29 ENCOUNTER — Encounter: Payer: Self-pay | Admitting: Family Medicine

## 2020-07-29 VITALS — BP 126/72 | HR 86 | Ht 61.0 in | Wt 191.0 lb

## 2020-07-29 DIAGNOSIS — M25532 Pain in left wrist: Secondary | ICD-10-CM | POA: Diagnosis not present

## 2020-07-29 DIAGNOSIS — I10 Essential (primary) hypertension: Secondary | ICD-10-CM

## 2020-07-29 DIAGNOSIS — E118 Type 2 diabetes mellitus with unspecified complications: Secondary | ICD-10-CM | POA: Diagnosis not present

## 2020-07-29 DIAGNOSIS — M545 Pain in left leg: Secondary | ICD-10-CM

## 2020-07-29 DIAGNOSIS — M79604 Pain in right leg: Secondary | ICD-10-CM

## 2020-07-29 LAB — POCT GLYCOSYLATED HEMOGLOBIN (HGB A1C): Hemoglobin A1C: 8.7 % — AB (ref 4.0–5.6)

## 2020-07-29 MED ORDER — CYCLOBENZAPRINE HCL 10 MG PO TABS: 10.0000 mg | ORAL_TABLET | Freq: Every evening | ORAL | 0 refills | Status: DC | PRN

## 2020-07-29 MED ORDER — MELOXICAM 15 MG PO TABS: 15.0000 mg | ORAL_TABLET | Freq: Every morning | ORAL | 1 refills | Status: DC

## 2020-07-29 MED ORDER — GLIPIZIDE ER 5 MG PO TB24: 5.0000 mg | ORAL_TABLET | Freq: Every day | ORAL | 0 refills | Status: DC

## 2020-07-29 NOTE — Assessment & Plan Note (Signed)
Recommend switching to meloxicam.  She is currently using Tylenol.  Also recommend trial of muscle relaxer at bedtime as she is having difficulty getting good rest because of her discomfort she will start physical therapy tomorrow we will go ahead and place referral today.  Please see MRI for details is not continuing to improve then consider referral to neurosurgery/Ortho

## 2020-07-29 NOTE — Assessment & Plan Note (Signed)
Blood pressure elevated today but it usually looks fantastic.  Encouraged her to check this at home and keep an eye on it let me know if it staying elevated.

## 2020-07-29 NOTE — Progress Notes (Signed)
Established Patient Office Visit  Subjective:  Patient ID: Terri Zimmerman, female    DOB: Sep 24, 1966  Age: 54 y.o. MRN: [id number]  CC:  Chief Complaint  Patient presents with  . Diabetes    HPI ROSELIND KLUS presents for   Diabetes - no hypoglycemic events. No wounds or sores that are not healing well. No increased thirst or urination. Checking glucose at home. Taking medications as prescribed without any side effects.  Hypertension- Pt denies chest pain, SOB, dizziness, or heart palpitations.  Taking meds as directed w/o problems.  Denies medication side effects.    Still having significant chronic low back pain.  Please see MRI results.  Would recommend referral to formal physical therapy she said she called [facility] close to where she lives and they have scheduled her for her for appointment tomorrow she needs an official referral placed.  We discussed the importance of working on PT and trying to get her better before referring her for more intervention to have type therapies.  She says her carpal tunnel in her left wrist is starting to bother her again.  She said she is probably at the point of needing to get back in with Dr. Dianah Field for an injection.  Past Medical History:  Diagnosis Date  . Asthma 04-25-07  . Diabetes mellitus 11-12-06   type 2  . Hypertension 11-04-06    Past Surgical History:  Procedure Laterality Date  . ABDOMINAL HYSTERECTOMY  12-11   Complete  . APPENDECTOMY  9-00  . BREAST BIOPSY Left   . BREAST CYST EXCISION Left   . BREAST SURGERY  4-00   of cyst  . FOOT TENDON SURGERY  05/16/2012   bone spur.    Marland Kitchen LAPAROSCOPY  4-08   for cyst and endometriosis  . left breast bx  12-11    Family History  Problem Relation Age of Onset  . Other Mother        RA and CHF  . Diabetes Father   . Diabetes Other   . Heart disease Other     Social History   Socioeconomic History  . Marital status: Married    Spouse name: Not on file  . Number of  children: Not on file  . Years of education: Not on file  . Highest education level: Not on file  Occupational History  . Not on file  Tobacco Use  . Smoking status: Current Every Day Smoker    Packs/day: 0.50    Types: Cigarettes  . Smokeless tobacco: Never Used  Vaping Use  . Vaping Use: Never used  Substance and Sexual Activity  . Alcohol use: No  . Drug use: No  . Sexual activity: Not on file  Other Topics Concern  . Not on file  Social History Narrative  . Not on file   Social Determinants of Health   Financial Resource Strain:   . Difficulty of Paying Living Expenses: Not on file  Food Insecurity:   . Worried About Charity fundraiser in the Last Year: Not on file  . Ran Out of Food in the Last Year: Not on file  Transportation Needs:   . Lack of Transportation (Medical): Not on file  . Lack of Transportation (Non-Medical): Not on file  Physical Activity:   . Days of Exercise per Week: Not on file  . Minutes of Exercise per Session: Not on file  Stress:   . Feeling of Stress : Not on file  Social Connections:   . Frequency of Communication with Friends and Family: Not on file  . Frequency of Social Gatherings with Friends and Family: Not on file  . Attends Religious Services: Not on file  . Active Member of Clubs or Organizations: Not on file  . Attends Archivist Meetings: Not on file  . Marital Status: Not on file  Intimate Partner Violence:   . Fear of Current or Ex-Partner: Not on file  . Emotionally Abused: Not on file  . Physically Abused: Not on file  . Sexually Abused: Not on file    Outpatient Medications Prior to Visit  Medication Sig Dispense Refill  . acetaminophen (TYLENOL) 325 MG tablet Take 650 mg by mouth 2 (two) times daily.    . AMBULATORY NON FORMULARY MEDICATION 1 each by Other route 2 (two) times daily. Lancets for daily glucose checks Diagnosis: E11.9 Testing 2 times a day 200 each 5  . glucose blood (ONE TOUCH ULTRA TEST)  test strip USE TO TEST BLOOD SUGARS 2 TIMES A DAY. DX:E11.8 200 each 11  . hydrocortisone 2.5 % cream Apply topically 2 (two) times daily. Avoid getting in eyes 75 g 0  . Insulin Glargine (BASAGLAR KWIKPEN) 100 UNIT/ML INJECT 80 UNITS INTO THE SKIN TWICE DAILY. DO NOT MIX WITH OTHER INSULINS 20 pen 38  . insulin lispro (HUMALOG KWIKPEN) 100 UNIT/ML KwikPen 5-10 units [place] before evening meal. 15 mL 3  . Insulin Pen Needle (B-D UF III MINI PEN NEEDLES) 31G X 5 MM MISC USE TO INJECT INSULIN UP TO THREE (3) TIMES DAILY. DIAGNOSIS DM E11.9 1 each 33  . lisinopril (ZESTRIL) 20 MG tablet TAKE 1 TABLET BY MOUTH EVERY DAY 90 tablet 1  . metFORMIN (GLUCOPHAGE-XR) 500 MG 24 hr tablet Take 2 tablets (1,000 mg total) by mouth daily with breakfast. 180 tablet 1  . metoprolol succinate (TOPROL-XL) 25 MG 24 hr tablet TAKE 1 TABLET BY MOUTH EVERYDAY AT BEDTIME 90 tablet 1  . OneTouch Delica Lancets 83T MISC UAD AS DIRECTED    . pravastatin (PRAVACHOL) 20 MG tablet TAKE 1 TABLET BY MOUTH EVERY DAY 90 tablet 3   No facility-administered medications prior to visit.    Allergies  Allergen Reactions  . Trulicity [Dulaglutide] Nausea Only    Abdominal pain,  . Farxiga [Dapagliflozin]     foggy  . Invokana [Canagliflozin] Other (See Comments)    Back pain  . Jentadueto [Linagliptin-Metformin Hcl Er]     Abdominal pains and diarrhea  . Lipitor [Atorvastatin]     Sudden hair loss 2 weeks after starting lipitor.   Lance Muss [Saxagliptin] Other (See Comments)    Pancreatitis  . Ozempic (0.25 Or 0.5 Mg-Dose) [Semaglutide(0.25 Or 0.5mg -Dos)] Diarrhea and Other (See Comments)    Abdominal pain  . Victoza [Liraglutide] Other (See Comments)    Abdominal pain and nausea.     ROS Review of Systems    Objective:    Physical Exam Constitutional:      Appearance: She is well-developed.  HENT:     Head: Normocephalic and atraumatic.  Cardiovascular:     Rate and Rhythm: Normal rate and regular rhythm.      Heart sounds: Normal heart sounds.  Pulmonary:     Effort: Pulmonary effort is normal.     Breath sounds: Normal breath sounds.  Skin:    General: Skin is warm and dry.  Neurological:     Mental Status: She is alert and oriented to person, place,  and time.  Psychiatric:        Mood and Affect: Mood normal.        Behavior: Behavior normal.     BP 126/72   Pulse 86   Ht 5\' 1"  (1.549 m)   Wt 191 lb (86.6 kg)   SpO2 97%   BMI 36.09 kg/m  Wt Readings from Last 3 Encounters:  07/29/20 191 lb (86.6 kg)  07/02/20 192 lb 1.3 oz (87.1 kg)  06/10/20 191 lb (86.6 kg)     Health Maintenance Due  Topic Date Due  . Hepatitis C Screening  Never done    There are no preventive care reminders to display for this patient.  Lab Results  Component Value Date   TSH 1.68 04/26/2020   Lab Results  Component Value Date   WBC 13.1 (H) 04/26/2020   HGB 15.0 04/26/2020   HCT 45.9 (H) 04/26/2020   MCV 85.0 04/26/2020   PLT 277 04/26/2020   Lab Results  Component Value Date   NA 142 04/26/2020   K 4.1 04/26/2020   CO2 30 04/26/2020   GLUCOSE 89 04/26/2020   BUN 11 04/26/2020   CREATININE 0.69 04/26/2020   BILITOT 0.3 04/26/2020   ALKPHOS 101 06/10/2019   AST 15 04/26/2020   ALT 13 04/26/2020   PROT 6.3 04/26/2020   ALBUMIN 3.9 06/10/2019   CALCIUM 10.1 04/26/2020   ANIONGAP 12 06/10/2019   Lab Results  Component Value Date   CHOL 116 04/26/2020   Lab Results  Component Value Date   HDL 39 (L) 04/26/2020   Lab Results  Component Value Date   LDLCALC 52 04/26/2020   Lab Results  Component Value Date   TRIG 168 (H) 04/26/2020   Lab Results  Component Value Date   CHOLHDL 3.0 04/26/2020   Lab Results  Component Value Date   HGBA1C 8.7 (A) 07/29/2020      Assessment & Plan:   Problem List Items Addressed This Visit      Cardiovascular and Mediastinum   Essential hypertension    Blood pressure elevated today but it usually looks fantastic.  Encouraged her  to check this at home and keep an eye on it let me know if it staying elevated.        Endocrine   Diabetes mellitus type 2 with complications ([place]) - Primary    Uncontrolled.  Hemoglobin A1c up to 8.7 today.  Discussed options.  Based on her intolerance list we will add glipizide.  I will see her back in 90 days.  Continue to work on diet.  Encouraged her to really cut back on carbohydrate intake and try to increase her vegetable intake.  And trying to be as consistent as possible with her Humalog.  If not at goal at next visit consider referral to endocrinology      Relevant Medications   glipiZIDE (GLUCOTROL XL) 5 MG 24 hr tablet   Other Relevant Orders   POCT glycosylated hemoglobin (Hb A1C) (Completed)     Other   Low back pain radiating to both legs    Recommend switching to meloxicam.  She is currently using Tylenol.  Also recommend trial of muscle relaxer at bedtime as she is having difficulty getting good rest because of her discomfort she will start physical therapy tomorrow we will go ahead and place referral today.  Please see MRI for details is not continuing to improve then consider referral to neurosurgery/Ortho      Relevant  Medications   meloxicam (MOBIC) 15 MG tablet   cyclobenzaprine (FLEXERIL) 10 MG tablet   Other Relevant Orders   Ambulatory referral to Physical Therapy    Other Visit Diagnoses    Left wrist pain          Left wrist pain-follow-up with Dr. Dianah Field for this.  Meds ordered this encounter  Medications  . glipiZIDE (GLUCOTROL XL) 5 MG 24 hr tablet    Sig: Take 1 tablet (5 mg total) by mouth daily with breakfast.    Dispense:  90 tablet    Refill:  0  . meloxicam (MOBIC) 15 MG tablet    Sig: Take 1 tablet (15 mg total) by mouth in the morning.    Dispense:  30 tablet    Refill:  1  . cyclobenzaprine (FLEXERIL) 10 MG tablet    Sig: Take 1 tablet (10 mg total) by mouth at bedtime as needed for muscle spasms.    Dispense:  90 tablet     Refill:  0    Follow-up: Return in about 3 months (around 10/28/2020) for Diabetes follow-up.    Beatrice Lecher, MD

## 2020-07-29 NOTE — Assessment & Plan Note (Addendum)
Uncontrolled.  Hemoglobin A1c up to 8.7 today.  Discussed options.  Based on her intolerance list we will add glipizide.  I will see her back in 90 days.  Continue to work on diet.  Encouraged her to really cut back on carbohydrate intake and try to increase her vegetable intake.  And trying to be as consistent as possible with her Humalog.  If not at goal at next visit consider referral to endocrinology

## 2020-07-29 NOTE — Telephone Encounter (Signed)
Spoke with patient on phone

## 2020-08-05 ENCOUNTER — Other Ambulatory Visit (HOSPITAL_COMMUNITY): Payer: Self-pay | Admitting: Nurse Practitioner

## 2020-08-05 ENCOUNTER — Encounter: Payer: Self-pay | Admitting: Family Medicine

## 2020-08-05 ENCOUNTER — Telehealth (INDEPENDENT_AMBULATORY_CARE_PROVIDER_SITE_OTHER): Payer: Managed Care, Other (non HMO) | Admitting: Family Medicine

## 2020-08-05 VITALS — BP 144/91 | HR 91 | Temp 98.3°F

## 2020-08-05 DIAGNOSIS — U071 COVID-19: Secondary | ICD-10-CM | POA: Diagnosis not present

## 2020-08-05 DIAGNOSIS — I10 Essential (primary) hypertension: Secondary | ICD-10-CM

## 2020-08-05 NOTE — Progress Notes (Signed)
She would like to discuss what medications she can take and can she get in for the infusion.

## 2020-08-05 NOTE — Progress Notes (Signed)
I connected by phone with patient to discuss the potential use of a new treatment for mild to moderate COVID-19 viral infection in non-hospitalized patients.  This patient is a that meets the FDA criteria for Emergency Use Authorization of COVID monoclonal antibody casirivimab/imdevimab.  Has a (+) direct SARS-CoV-2 viral test result  Has mild or moderate COVID-19   Is NOT hospitalized due to COVID-19  Is within 10 days of symptom onset  Has at least one of the high risk factor(s)for progression to severe COVID-19 and/or hospitalization as defined in EUA. ? Specific high risk criteria : Diabetes, hypertension, obesity   I have spoken and communicated the following to the patient or parent/caregiver regarding COVID monoclonal antibody treatment:  1. FDA has authorized the emergency use for the treatment of high risk post-exposure prophylaxis for COVID19.   2. The significant known and potential risks and benefits of COVID monoclonal antibody, and the extent to which such potential risks and benefits are unknown.  3. Information on available alternative treatments and the risks and benefits of those alternatives, including clinical trials.  4. Patients treated with COVID monoclonal antibody should continue to self-isolate and use infection control measures (e.g., wear mask, isolate, social distance, avoid sharing personal items, clean and disinfect "high touch" surfaces, and frequent handwashing) according to CDC guidelines.   5. The patient or parent/caregiver has the option to accept or refuse COVID monoclonal antibody treatment.  After reviewing this information with the patient, The patient agreed to proceed with receiving casirivimab\imdevimab infusion and will be provided a copy of the Fact sheet prior to receiving the infusion. Mariana Kaufman, NP

## 2020-08-05 NOTE — Progress Notes (Signed)
Virtual Visit via Video Note  I connected with Terri Zimmerman on 08/05/20 at 10:10 AM EDT by a video enabled telemedicine application and verified that I am speaking with the correct person using two identifiers.   I discussed the limitations of evaluation and management by telemedicine and the availability of in person appointments. The patient expressed understanding and agreed to proceed.  Patient location: at home.   Provider location: in office  Subjective:    CC: COVID pos  HPI: 54 yo female smoker c/o symptoms started on Thursday approximately.  She actually got worse on Friday so did a home Covid test and it was positive on Friday and on Saturday she did go over the weekend to local pharmacy for PCR testing and has not gotten that result back yet.  He is mostly have nasal congestion sore throat and headache as well as fatigue and decreased taste.  He has not had any shortness of breath or significant respiratory symptoms.   Past medical history, Surgical history, Family history not pertinant except as noted below, Social history, Allergies, and medications have been entered into the medical record, reviewed, and corrections made.   Review of Systems: No fevers, chills, night sweats, weight loss, chest pain, or shortness of breath.   Objective:    General: Speaking clearly in complete sentences without any shortness of breath.  Alert and oriented x3.  Normal judgment. No apparent acute distress.    Impression and Recommendations:    No problem-specific Assessment & Plan notes found for this encounter.  COVID-19-she is high risk with diabetes, elevated BMI, smoker.  Would recommend monoclonal antibody infusion she did have the Janssen vaccine in April.  PCR test is pending that was done at [facility] yesterday but she has had 2+ imaging home tests.  She is to call back if she develops any new symptoms or worsening symptoms or is just not improving over the next week.    Time spent in  encounter 20 minutes  I discussed the assessment and treatment plan with the patient. The patient was provided an opportunity to ask questions and all were answered. The patient agreed with the plan and demonstrated an understanding of the instructions.   The patient was advised to call back or seek an in-person evaluation if the symptoms worsen or if the condition fails to improve as anticipated.   Beatrice Lecher, MD

## 2020-08-06 ENCOUNTER — Ambulatory Visit (HOSPITAL_COMMUNITY): Payer: Managed Care, Other (non HMO)

## 2020-08-06 ENCOUNTER — Ambulatory Visit (HOSPITAL_COMMUNITY)
Admission: RE | Admit: 2020-08-06 | Discharge: 2020-08-06 | Disposition: A | Discharge status: Home / Self Care | Payer: Managed Care, Other (non HMO) | Source: Ambulatory Visit | Attending: Pulmonary Disease | Admitting: Pulmonary Disease

## 2020-08-06 ENCOUNTER — Other Ambulatory Visit (HOSPITAL_COMMUNITY): Payer: Self-pay

## 2020-08-06 DIAGNOSIS — U071 COVID-19: Secondary | ICD-10-CM | POA: Diagnosis not present

## 2020-08-06 MED ORDER — SODIUM CHLORIDE 0.9 % IV SOLN: INTRAVENOUS | Status: DC | PRN

## 2020-08-06 MED ORDER — ALBUTEROL SULFATE HFA 108 (90 BASE) MCG/ACT IN AERS: 2.0000 | INHALATION_SPRAY | Freq: Once | RESPIRATORY_TRACT | Status: DC | PRN

## 2020-08-06 MED ORDER — EPINEPHRINE 0.3 MG/0.3ML IJ SOAJ: 0.3000 mg | Freq: Once | INTRAMUSCULAR | Status: DC | PRN

## 2020-08-06 MED ORDER — METHYLPREDNISOLONE SODIUM SUCC 125 MG IJ SOLR: 125.0000 mg | Freq: Once | INTRAMUSCULAR | Status: DC | PRN

## 2020-08-06 MED ORDER — SODIUM CHLORIDE 0.9 % IV SOLN
1200.0000 mg | Freq: Once | INTRAVENOUS | Status: AC
  Administered 2020-08-06: 1200 mg via INTRAVENOUS

## 2020-08-06 MED ORDER — DIPHENHYDRAMINE HCL 50 MG/ML IJ SOLN: 50.0000 mg | Freq: Once | INTRAMUSCULAR | Status: DC | PRN

## 2020-08-06 MED ORDER — FAMOTIDINE IN NACL 20-0.9 MG/50ML-% IV SOLN: 20.0000 mg | Freq: Once | INTRAVENOUS | Status: DC | PRN

## 2020-08-06 NOTE — Discharge Instructions (Signed)

## 2020-08-06 NOTE — Progress Notes (Signed)
  Diagnosis: COVID-19  Physician:Dr Joya Gaskins  Procedure: [facility] Infusion [facility] Med: casirivimab\imdevimab infusion - Provided patient with casirivimab\imdevimab fact sheet for patients, parents and caregivers prior to infusion.  Complications: No immediate complications noted.  Discharge: Discharged home   [place], [place] 08/06/2020

## 2020-08-08 ENCOUNTER — Ambulatory Visit: Payer: Managed Care, Other (non HMO) | Admitting: Rehabilitative and Restorative Service Providers"

## 2020-08-12 ENCOUNTER — Ambulatory Visit: Payer: Managed Care, Other (non HMO) | Admitting: Physical Therapy

## 2020-08-15 ENCOUNTER — Ambulatory Visit: Payer: Managed Care, Other (non HMO) | Admitting: Rehabilitative and Restorative Service Providers"

## 2020-08-19 ENCOUNTER — Ambulatory Visit: Payer: Managed Care, Other (non HMO) | Admitting: Physical Therapy

## 2020-08-21 ENCOUNTER — Ambulatory Visit: Payer: Managed Care, Other (non HMO) | Admitting: Physical Therapy

## 2020-08-26 ENCOUNTER — Other Ambulatory Visit: Payer: Self-pay | Admitting: Family Medicine

## 2020-08-29 ENCOUNTER — Ambulatory Visit: Payer: Managed Care, Other (non HMO)

## 2020-09-12 ENCOUNTER — Other Ambulatory Visit: Payer: Self-pay

## 2020-09-12 ENCOUNTER — Ambulatory Visit (INDEPENDENT_AMBULATORY_CARE_PROVIDER_SITE_OTHER): Payer: Managed Care, Other (non HMO)

## 2020-09-12 DIAGNOSIS — Z1231 Encounter for screening mammogram for malignant neoplasm of breast: Secondary | ICD-10-CM

## 2020-10-08 ENCOUNTER — Encounter: Payer: Self-pay | Admitting: Family Medicine

## 2020-10-08 ENCOUNTER — Telehealth (INDEPENDENT_AMBULATORY_CARE_PROVIDER_SITE_OTHER): Payer: Managed Care, Other (non HMO) | Admitting: Family Medicine

## 2020-10-08 VITALS — BP 144/91 | HR 97 | Temp 97.6°F | Ht 61.0 in | Wt 191.0 lb

## 2020-10-08 DIAGNOSIS — U071 COVID-19: Secondary | ICD-10-CM | POA: Diagnosis not present

## 2020-10-08 DIAGNOSIS — J019 Acute sinusitis, unspecified: Secondary | ICD-10-CM | POA: Diagnosis not present

## 2020-10-08 MED ORDER — AMOXICILLIN-POT CLAVULANATE 875-125 MG PO TABS: 1.0000 | ORAL_TABLET | Freq: Two times a day (BID) | ORAL | 0 refills | Status: DC

## 2020-10-08 NOTE — Progress Notes (Signed)
Virtual Visit via Video Note  I connected with Terri Zimmerman on 10/08/20 at  3:40 PM EST by a video enabled telemedicine application and verified that I am speaking with the correct person using two identifiers.   I discussed the limitations of evaluation and management by telemedicine and the availability of in person appointments. The patient expressed understanding and agreed to proceed.  Patient location: at home  Provider location: in office  Subjective:    CC: Cough and congestion   HPI: Status post Covid  08/05/2020. Had monoclonal infusion at the end of September  Still having: Cough, feels like it is mostly in her throat area.  Not deep in the chest. Sore throat Drainage at night No energy Nasal congestion in the daytime  Feels like she never got better (maybe had two good days, but never fully better).  Feels like she is gotten worse in the last couple of days especially regard to the bilateral ear pain  No body aches, chills, fever No shortness of breath. No facial pain  Medications tried include Robitussin cold and flu, Mucinex, Vicks VapoRub  Stopped Glipizide - felt like giving her heart palpitations Stopped Meloxiam - told her to stop when had infusion and never started back   Sugars running 120-150's in the morning (appt on 10/28/2020 for follow up)   Past medical history, Surgical history, Family history not pertinant except as noted below, Social history, Allergies, and medications have been entered into the medical record, reviewed, and corrections made.   Review of Systems: No fevers, chills, night sweats, weight loss, chest pain, or shortness of breath.   Objective:    General: Speaking clearly in complete sentences without any shortness of breath.  Alert and oriented x3.  Normal judgment. No apparent acute distress.    Impression and Recommendations:    No problem-specific Assessment & Plan notes found for this encounter.   Acute  sinusitis-most likely secondary post viral bacterial infection after having had Covid.  Again never completely got better but feels like she is actually gotten worse in the last couple days especially with new onset ear pain and sore throat.  We will go ahead and treat with Augmentin.  Okay to continue over-the-counter decongestant and Tylenol.  Recommend nasal saline irrigation at bedtime.  If not improving after the holiday weekend then please give [place] a call back.   Time spent in encounter 20 minutes  I discussed the assessment and treatment plan with the patient. The patient was provided an opportunity to ask questions and all were answered. The patient agreed with the plan and demonstrated an understanding of the instructions.   The patient was advised to call back or seek an in-person evaluation if the symptoms worsen or if the condition fails to improve as anticipated.   Beatrice Lecher, MD

## 2020-10-08 NOTE — Progress Notes (Signed)
Status post Covid   Had monoclonal infusion at the end of September  Still having: Cough Sore throat Drainage at night No energy  Feels like she never got better (maybe had two good days, but never fully better)  No body aches, chills, fever  Stopped Glipizide - felt like giving her heart palpitations Stopped Meloxiam - told her to stop when had infusion and never started back   Sugars running 120-150's in the morning (appt on 10/28/2020 for follow up)

## 2020-10-16 ENCOUNTER — Other Ambulatory Visit: Payer: Self-pay | Admitting: Family Medicine

## 2020-10-16 DIAGNOSIS — R200 Anesthesia of skin: Secondary | ICD-10-CM

## 2020-10-16 DIAGNOSIS — M791 Myalgia, unspecified site: Secondary | ICD-10-CM

## 2020-10-16 DIAGNOSIS — R202 Paresthesia of skin: Secondary | ICD-10-CM

## 2020-10-16 DIAGNOSIS — M2550 Pain in unspecified joint: Secondary | ICD-10-CM

## 2020-10-19 ENCOUNTER — Other Ambulatory Visit: Payer: Self-pay | Admitting: Family Medicine

## 2020-10-19 DIAGNOSIS — E118 Type 2 diabetes mellitus with unspecified complications: Secondary | ICD-10-CM

## 2020-10-28 ENCOUNTER — Ambulatory Visit (INDEPENDENT_AMBULATORY_CARE_PROVIDER_SITE_OTHER): Payer: Managed Care, Other (non HMO) | Admitting: Family Medicine

## 2020-10-28 ENCOUNTER — Other Ambulatory Visit: Payer: Self-pay

## 2020-10-28 ENCOUNTER — Encounter: Payer: Self-pay | Admitting: Family Medicine

## 2020-10-28 VITALS — BP 124/56 | HR 48 | Ht 61.0 in | Wt 191.0 lb

## 2020-10-28 DIAGNOSIS — Z23 Encounter for immunization: Secondary | ICD-10-CM

## 2020-10-28 DIAGNOSIS — U099 Post covid-19 condition, unspecified: Secondary | ICD-10-CM

## 2020-10-28 DIAGNOSIS — R053 Post covid-19 condition, unspecified: Secondary | ICD-10-CM | POA: Insufficient documentation

## 2020-10-28 DIAGNOSIS — E118 Type 2 diabetes mellitus with unspecified complications: Secondary | ICD-10-CM | POA: Diagnosis not present

## 2020-10-28 DIAGNOSIS — I10 Essential (primary) hypertension: Secondary | ICD-10-CM

## 2020-10-28 DIAGNOSIS — F439 Reaction to severe stress, unspecified: Secondary | ICD-10-CM | POA: Diagnosis not present

## 2020-10-28 LAB — POCT GLYCOSYLATED HEMOGLOBIN (HGB A1C): Hemoglobin A1C: 9.4 % — AB (ref 4.0–5.6)

## 2020-10-28 NOTE — Assessment & Plan Note (Signed)
A1c not well controlled today elevated at 9.4.  She is not been on glipizide for the last 3 months.  We started about a month prior to her getting Covid but started experiencing palpitations and just was not sure if it was related to Covid or if it was related to the new medication.  So she is just held it.  She will retry it now to see how she does we also discussed referring her to endocrinology she has had multiple medication intolerances and I would like some assistance with getting her under control.  She admits she also is not doing her part very well to maintain a healthy diet.  She has been under a lot of stress recently with her family and with some marital issues.  But she says she will try to work on it.

## 2020-10-28 NOTE — Assessment & Plan Note (Signed)
Well controlled. Continue current regimen. Follow up in

## 2020-10-28 NOTE — Progress Notes (Signed)
Established Patient Office Visit  Subjective:  Patient ID: Terri Zimmerman, female    DOB: Nov 17, 1965  Age: 54 y.o. MRN: [id number]  CC:  Chief Complaint  Patient presents with  . Diabetes    HPI Terri Zimmerman presents for   Diabetes uncontrolled  - no hypoglycemic events. No wounds or sores that are not healing well. No increased thirst or urination. Checking glucose at home. Taking medications as prescribed without any side effects.  She says she held her glipizide and pravastatin ever since she had Covid back in September.  Hypertension- Pt denies chest pain, SOB, dizziness, or heart palpitations.  Taking meds as directed w/o problems.  Denies medication side effects.    Also recently treated for sinusitis.  She says her congestion is actually much better but still having a persistent cough ever since she was diagnosed with Covid in September.  She also reports that her tongue has been sticking to the roof of her mouth.  Past Medical History:  Diagnosis Date  . Asthma 04-25-07  . Diabetes mellitus 11-12-06   type 2  . Hypertension 11-04-06    Past Surgical History:  Procedure Laterality Date  . ABDOMINAL HYSTERECTOMY  12-11   Complete  . APPENDECTOMY  9-00  . BREAST BIOPSY Left   . BREAST CYST EXCISION Left   . BREAST SURGERY  4-00   of cyst  . FOOT TENDON SURGERY  05/16/2012   bone spur.    Marland Kitchen LAPAROSCOPY  4-08   for cyst and endometriosis  . left breast bx  12-11    Family History  Problem Relation Age of Onset  . Other Mother        RA and CHF  . Diabetes Father   . Diabetes Other   . Heart disease Other     Social History   Socioeconomic History  . Marital status: Married    Spouse name: Not on file  . Number of children: Not on file  . Years of education: Not on file  . Highest education level: Not on file  Occupational History  . Not on file  Tobacco Use  . Smoking status: Current Every Day Smoker    Packs/day: 0.50    Types: Cigarettes  .  Smokeless tobacco: Never Used  Vaping Use  . Vaping Use: Never used  Substance and Sexual Activity  . Alcohol use: No  . Drug use: No  . Sexual activity: Not on file  Other Topics Concern  . Not on file  Social History Narrative  . Not on file   Social Determinants of Health   Financial Resource Strain: Not on file  Food Insecurity: Not on file  Transportation Needs: Not on file  Physical Activity: Not on file  Stress: Not on file  Social Connections: Not on file  Intimate Partner Violence: Not on file    Outpatient Medications Prior to Visit  Medication Sig Dispense Refill  . acetaminophen (TYLENOL) 325 MG tablet Take 650 mg by mouth 2 (two) times daily.    . AMBULATORY NON FORMULARY MEDICATION 1 each by Other route 2 (two) times daily. Lancets for daily glucose checks Diagnosis: E11.9 Testing 2 times a day 200 each 5  . glucose blood (ONE TOUCH ULTRA TEST) test strip USE TO TEST BLOOD SUGARS 2 TIMES A DAY. DX:E11.8 200 each 11  . Insulin Glargine (BASAGLAR KWIKPEN) 100 UNIT/ML INJECT 80 UNITS INTO THE SKIN TWICE DAILY. DO NOT MIX WITH OTHER INSULINS 20  pen 38  . insulin lispro (HUMALOG KWIKPEN) 100 UNIT/ML KwikPen 5-10 units [place] before evening meal. 15 mL 3  . Insulin Pen Needle (B-D UF III MINI PEN NEEDLES) 31G X 5 MM MISC USE TO INJECT INSULIN UP TO THREE (3) TIMES DAILY. DIAGNOSIS DM E11.9 1 each 33  . lisinopril (ZESTRIL) 20 MG tablet TAKE 1 TABLET BY MOUTH EVERY DAY 90 tablet 1  . metFORMIN (GLUCOPHAGE-XR) 500 MG 24 hr tablet TAKE 2 TABLETS BY MOUTH EVERY DAY WITH BREAKFAST 180 tablet 1  . metoprolol succinate (TOPROL-XL) 25 MG 24 hr tablet TAKE 1 TABLET BY MOUTH EVERYDAY AT BEDTIME 90 tablet 1  . OneTouch Delica Lancets 09B MISC UAD AS DIRECTED    . glipiZIDE (GLUCOTROL XL) 5 MG 24 hr tablet TAKE 1 TABLET BY MOUTH EVERY DAY WITH BREAKFAST (Patient not taking: Reported on 10/28/2020) 90 tablet 0  . pravastatin (PRAVACHOL) 20 MG tablet TAKE 1 TABLET BY MOUTH EVERY DAY  (Patient not taking: Reported on 10/28/2020) 90 tablet 3  . amoxicillin-clavulanate (AUGMENTIN) 875-125 MG tablet Take 1 tablet by mouth 2 (two) times daily. 20 tablet 0  . cyclobenzaprine (FLEXERIL) 10 MG tablet Take 1 tablet (10 mg total) by mouth at bedtime as needed for muscle spasms. 90 tablet 0  . hydrocortisone 2.5 % cream Apply topically 2 (two) times daily. Avoid getting in eyes 75 g 0  . meloxicam (MOBIC) 15 MG tablet Take 1 tablet (15 mg total) by mouth in the morning. (Patient not taking: Reported on 10/08/2020) 30 tablet 1   No facility-administered medications prior to visit.    Allergies  Allergen Reactions  . Trulicity [Dulaglutide] Nausea Only    Abdominal pain,  . Farxiga [Dapagliflozin]     foggy  . Invokana [Canagliflozin] Other (See Comments)    Back pain  . Jentadueto [Linagliptin-Metformin Hcl Er]     Abdominal pains and diarrhea  . Lipitor [Atorvastatin]     Sudden hair loss 2 weeks after starting lipitor.   Lance Muss [Saxagliptin] Other (See Comments)    Pancreatitis  . Ozempic (0.25 Or 0.5 Mg-Dose) [Semaglutide(0.25 Or 0.5mg -Dos)] Diarrhea and Other (See Comments)    Abdominal pain  . Victoza [Liraglutide] Other (See Comments)    Abdominal pain and nausea.     ROS Review of Systems    Objective:    Physical Exam Constitutional:      Appearance: She is well-developed and well-nourished.  HENT:     Head: Normocephalic and atraumatic.  Cardiovascular:     Rate and Rhythm: Normal rate and regular rhythm.     Heart sounds: Normal heart sounds.  Pulmonary:     Effort: Pulmonary effort is normal.     Breath sounds: Normal breath sounds.  Skin:    General: Skin is warm and dry.  Neurological:     Mental Status: She is alert and oriented to person, place, and time.  Psychiatric:        Mood and Affect: Mood and affect normal.        Behavior: Behavior normal.     BP (!) 124/56   Pulse (!) 48   Ht 5\' 1"  (1.549 m)   Wt 191 lb (86.6 kg)   SpO2  98%   BMI 36.09 kg/m  Wt Readings from Last 3 Encounters:  10/28/20 191 lb (86.6 kg)  10/08/20 191 lb (86.6 kg)  07/29/20 191 lb (86.6 kg)     Health Maintenance Due  Topic Date Due  . Hepatitis  C Screening  Never done  . COVID-19 Vaccine (2 - Booster for [employer] series) 04/19/2020  . OPHTHALMOLOGY EXAM  05/11/2020    There are no preventive care reminders to display for this patient.  Lab Results  Component Value Date   TSH 1.68 04/26/2020   Lab Results  Component Value Date   WBC 13.1 (H) 04/26/2020   HGB 15.0 04/26/2020   HCT 45.9 (H) 04/26/2020   MCV 85.0 04/26/2020   PLT 277 04/26/2020   Lab Results  Component Value Date   NA 142 04/26/2020   K 4.1 04/26/2020   CO2 30 04/26/2020   GLUCOSE 89 04/26/2020   BUN 11 04/26/2020   CREATININE 0.69 04/26/2020   BILITOT 0.3 04/26/2020   ALKPHOS 101 06/10/2019   AST 15 04/26/2020   ALT 13 04/26/2020   PROT 6.3 04/26/2020   ALBUMIN 3.9 06/10/2019   CALCIUM 10.1 04/26/2020   ANIONGAP 12 06/10/2019   Lab Results  Component Value Date   CHOL 116 04/26/2020   Lab Results  Component Value Date   HDL 39 (L) 04/26/2020   Lab Results  Component Value Date   LDLCALC 52 04/26/2020   Lab Results  Component Value Date   TRIG 168 (H) 04/26/2020   Lab Results  Component Value Date   CHOLHDL 3.0 04/26/2020   Lab Results  Component Value Date   HGBA1C 9.4 (A) 10/28/2020      Assessment & Plan:   Problem List Items Addressed This Visit      Cardiovascular and Mediastinum   Essential hypertension    Well controlled. Continue current regimen. 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  She is not been on glipizide for the last 3 months.  We started about a month prior to her getting Covid but started experiencing palpitations and just was not sure if  it was related to Covid or if it was related to the new medication.  So she is just held it.  She will retry it now to see how she does we also discussed referring her to endocrinology she has had multiple medication intolerances and I would like some assistance with getting her under control.  She admits she also is not doing her part very well to maintain a healthy diet.  She has been under a lot of stress recently with her family and with some marital issues.  But she says she will try to work on it.      Relevant Orders   POCT glycosylated hemoglobin (Hb A1C) (Completed)   COMPLETE METABOLIC PANEL WITH GFR   Ambulatory referral to Endocrinology     Other   Stress at home    Urged her to consider therapy/counseling.  Having some marital stress.  She says for now she will be open to considering it but is not interested at this time.  She just wants to get through the holidays.      Post-COVID chronic cough    Overall she is feeling better but still dealing with chronic cough.       Other Visit Diagnoses    Need for immunization against influenza       Relevant Orders   Flu Vaccine QUAD 36+ mos IM (Completed)  No orders of the defined types were placed in this encounter.   Follow-up: Return in about 3 months (around 01/26/2021) for Diabetes follow-up.    Beatrice Lecher, MD

## 2020-10-28 NOTE — Assessment & Plan Note (Signed)
Urged her to consider therapy/counseling.  Having some marital stress.  She says for now she will be open to considering it but is not interested at this time.  She just wants to get through the holidays.

## 2020-10-28 NOTE — Assessment & Plan Note (Signed)
Overall she is feeling better but still dealing with chronic cough.

## 2020-10-29 LAB — COMPLETE METABOLIC PANEL WITH GFR
AG Ratio: 1.4 (calc) (ref 1.0–2.5)
ALT: 21 U/L (ref 6–29)
AST: 19 U/L (ref 10–35)
Albumin: 3.7 g/dL (ref 3.6–5.1)
Alkaline phosphatase (APISO): 102 U/L (ref 37–153)
BUN: 7 mg/dL (ref 7–25)
CO2: 28 mmol/L (ref 20–32)
Calcium: 9.3 mg/dL (ref 8.6–10.4)
Chloride: 106 mmol/L (ref 98–110)
Creat: 0.62 mg/dL (ref 0.50–1.05)
GFR, Est African American: 118 mL/min/{1.73_m2} (ref >=60)
GFR, Est Non African American: 102 mL/min/{1.73_m2} (ref >=60)
Globulin: 2.6 g/dL (calc) (ref 1.9–3.7)
Glucose, Bld: 193 mg/dL — ABNORMAL HIGH (ref 65–99)
Potassium: 4.5 mmol/L (ref 3.5–5.3)
Sodium: 141 mmol/L (ref 135–146)
Total Bilirubin: 0.5 mg/dL (ref 0.2–1.2)
Total Protein: 6.3 g/dL (ref 6.1–8.1)

## 2020-10-29 NOTE — Progress Notes (Signed)
All labs are normal. 

## 2020-11-02 ENCOUNTER — Other Ambulatory Visit: Payer: Self-pay | Admitting: Family Medicine

## 2020-11-04 ENCOUNTER — Encounter: Payer: Self-pay | Admitting: Internal Medicine

## 2020-12-31 ENCOUNTER — Telehealth: Payer: Self-pay

## 2020-12-31 ENCOUNTER — Ambulatory Visit (INDEPENDENT_AMBULATORY_CARE_PROVIDER_SITE_OTHER): Payer: Managed Care, Other (non HMO) | Admitting: Internal Medicine

## 2020-12-31 ENCOUNTER — Other Ambulatory Visit: Payer: Self-pay

## 2020-12-31 ENCOUNTER — Encounter: Payer: Self-pay | Admitting: Internal Medicine

## 2020-12-31 VITALS — BP 142/78 | HR 94 | Ht 61.0 in | Wt 192.1 lb

## 2020-12-31 DIAGNOSIS — Z794 Long term (current) use of insulin: Secondary | ICD-10-CM

## 2020-12-31 DIAGNOSIS — E1165 Type 2 diabetes mellitus with hyperglycemia: Secondary | ICD-10-CM | POA: Diagnosis not present

## 2020-12-31 DIAGNOSIS — E118 Type 2 diabetes mellitus with unspecified complications: Secondary | ICD-10-CM

## 2020-12-31 DIAGNOSIS — D3501 Benign neoplasm of right adrenal gland: Secondary | ICD-10-CM

## 2020-12-31 LAB — POCT GLUCOSE (DEVICE FOR HOME USE): POC Glucose: 448 mg/dl — AB (ref 70–99)

## 2020-12-31 LAB — POCT GLYCOSYLATED HEMOGLOBIN (HGB A1C): Hemoglobin A1C: 9.3 % — AB (ref 4.0–5.6)

## 2020-12-31 MED ORDER — DAPAGLIFLOZIN PROPANEDIOL 5 MG PO TABS: 5.0000 mg | ORAL_TABLET | Freq: Every day | ORAL | 6 refills | Status: DC

## 2020-12-31 NOTE — Progress Notes (Signed)
Name: Terri Zimmerman  MRN/ DOB: [id number], 12-05-1965   Age/ Sex: 55 y.o., female    PCP: Hali Marry, MD   Reason for Endocrinology Evaluation: Type 2 Diabetes Mellitus     Date of Initial Endocrinology Visit: 12/31/2020     PATIENT IDENTIFIER: [name] is a 55 y.o. female with a past medical history of T2DM . The patient presented for initial endocrinology clinic visit on 12/31/2020 for consultative assistance with her diabetes management.    HPI: Terri Zimmerman was    Diagnosed with DM in 2009 Prior Medications tried/Intolerance: Ozempic-Farxiga- Abdominal pain/diarrhea, Invokana - back pain. Onglyza- Pancreatitis. Victoza- Abdominal pain. Has been on insulin since diagnosis.  Currently checking blood sugars occasionally   Hypoglycemia episodes : no             Hemoglobin A1c has ranged from 6.8 % in 2017, peaking at 9.4% in 2021. Patient required assistance for hypoglycemia: no Patient has required hospitalization within the last 1 year from hyper or hypoglycemia: no   In terms of diet, the patient eats 3 meals a day, snacks twice a day     She has chronic GI issues, that has improved. Denies nausea, constipation or diarrhea   Has occasional palpitations    ADRENAL HISTORY: She had an incidental finding of a right adrenal adenoma on abdominal CT 2017 measuring 2.8 cm during evaluation of abdominal pain     HOME DIABETES REGIMEN: Basaglar 80 units BID  Humalog 5 units with lunch and 10 units with Supper - not taking  Metformin 500 mg XR 2 tabs with Breakfast       Statin: yes ACE-I/ARB: yes Prior Diabetic Education: long time ago   METER DOWNLOAD SUMMARY: Did not bring      DIABETIC COMPLICATIONS: Microvascular complications:     Denies: CKD, retinopathy, neuropathy   Last eye exam: Completed 06/2020  Macrovascular complications:    Denies: CAD, PVD, CVA   PAST HISTORY: Past Medical History:  Past Medical History:  Diagnosis Date   . Asthma 04-25-07  . Diabetes mellitus 11-12-06   type 2  . Hypertension 11-04-06   Past Surgical History:  Past Surgical History:  Procedure Laterality Date  . ABDOMINAL HYSTERECTOMY  12-11   Complete  . APPENDECTOMY  9-00  . BREAST BIOPSY Left   . BREAST CYST EXCISION Left   . BREAST SURGERY  4-00   of cyst  . FOOT TENDON SURGERY  05/16/2012   bone spur.    Marland Kitchen LAPAROSCOPY  4-08   for cyst and endometriosis  . left breast bx  12-11      Social History:  reports that she has been smoking cigarettes. She has been smoking about 0.50 packs per day. She has never used smokeless tobacco. She reports that she does not drink alcohol and does not use drugs. Family History:  Family History  Problem Relation Age of Onset  . Other Mother        RA and CHF  . Diabetes Father   . Diabetes Other   . Heart disease Other      HOME MEDICATIONS: Allergies as of 12/31/2020      Reactions   Trulicity [dulaglutide] Nausea Only   Abdominal pain,   Farxiga [dapagliflozin]    foggy   Invokana [canagliflozin] Other (See Comments)   Back pain   Jentadueto [linagliptin-metformin Hcl Er]    Abdominal pains and diarrhea   Lipitor [atorvastatin]    Sudden hair  loss 2 weeks after starting lipitor.    Onglyza [saxagliptin] Other (See Comments)   Pancreatitis   Ozempic (0.25 Or 0.5 Mg-dose) [semaglutide(0.25 Or 0.5mg -dos)] Diarrhea, Other (See Comments)   Abdominal pain   Victoza [liraglutide] Other (See Comments)   Abdominal pain and nausea.       Medication List       Accurate as of December 31, 2020  8:35 AM. If you have any questions, ask your nurse or doctor.        STOP taking these medications   glipiZIDE 5 MG 24 hr tablet Commonly known as: GLUCOTROL XL Stopped by: Dorita Sciara, MD   pravastatin 20 MG tablet Commonly known as: PRAVACHOL Stopped by: Dorita Sciara, MD     TAKE these medications   acetaminophen 325 MG tablet Commonly known as: TYLENOL Take  650 mg by mouth 2 (two) times daily.   AMBULATORY NON FORMULARY MEDICATION 1 each by Other route 2 (two) times daily. Lancets for daily glucose checks Diagnosis: E11.9 Testing 2 times a day   B-D UF III MINI PEN NEEDLES 31G X 5 MM Misc Generic drug: Insulin Pen Needle USE TO INJECT INSULIN UP TO THREE (3) TIMES DAILY. DIAGNOSIS DM E11.9   Basaglar KwikPen 100 UNIT/ML INJECT 80 UNITS INTO THE SKIN TWICE DAILY. DO NOT MIX WITH OTHER INSULINS   glucose blood test strip Commonly known as: ONE TOUCH ULTRA TEST USE TO TEST BLOOD SUGARS 2 TIMES A DAY. DX:E11.8   insulin lispro 100 UNIT/ML KwikPen Commonly known as: HumaLOG KwikPen 5-10 units [place] before evening meal.   lisinopril 20 MG tablet Commonly known as: ZESTRIL TAKE 1 TABLET BY MOUTH EVERY DAY   metFORMIN 500 MG 24 hr tablet Commonly known as: GLUCOPHAGE-XR TAKE 2 TABLETS BY MOUTH EVERY DAY WITH BREAKFAST   metoprolol succinate 25 MG 24 hr tablet Commonly known as: TOPROL-XL TAKE 1 TABLET BY MOUTH EVERYDAY AT BEDTIME   OneTouch Delica Lancets 61W Misc UAD AS DIRECTED        ALLERGIES: Allergies  Allergen Reactions  . Trulicity [Dulaglutide] Nausea Only    Abdominal pain,  . Farxiga [Dapagliflozin]     foggy  . Invokana [Canagliflozin] Other (See Comments)    Back pain  . Jentadueto [Linagliptin-Metformin Hcl Er]     Abdominal pains and diarrhea  . Lipitor [Atorvastatin]     Sudden hair loss 2 weeks after starting lipitor.   Lance Muss [Saxagliptin] Other (See Comments)    Pancreatitis  . Ozempic (0.25 Or 0.5 Mg-Dose) [Semaglutide(0.25 Or 0.5mg -Dos)] Diarrhea and Other (See Comments)    Abdominal pain  . Victoza [Liraglutide] Other (See Comments)    Abdominal pain and nausea.      REVIEW OF SYSTEMS: A comprehensive ROS was conducted with the patient and is negative except as per HPI    OBJECTIVE:   VITAL SIGNS: BP (!) 142/78   Pulse 94   Ht 5\' 1"  (1.549 m)   Wt 192 lb 2 oz (87.1 kg)   SpO2 98%   BMI  36.30 kg/m    PHYSICAL EXAM:  General: Pt appears well and is in NAD  Neck: General: Supple without adenopathy or carotid bruits. Thyroid: Thyroid size normal.  No goiter or nodules appreciated.  Lungs: Clear with good BS bilat with no rales, rhonchi, or wheezes  Heart: RRR with normal S1 and S2 and no gallops; no murmurs; no rub  Abdomen: Normoactive bowel sounds, soft, nontender, without masses or organomegaly palpable  Extremities:  Lower extremities - No pretibial edema. No lesions.  Skin: Normal texture and temperature to palpation.  Neuro: MS is good with appropriate affect, pt is alert and Ox3    DATA REVIEWED:  Lab Results  Component Value Date   HGBA1C 9.4 (A) 10/28/2020   HGBA1C 8.7 (A) 07/29/2020   HGBA1C 8.0 (A) 04/26/2020   Lab Results  Component Value Date   MICROALBUR 30 07/27/2018   LDLCALC 52 04/26/2020   CREATININE 0.62 10/28/2020   Lab Results  Component Value Date   MICRALBCREAT <30 07/27/2018    Lab Results  Component Value Date   CHOL 116 04/26/2020   HDL 39 (L) 04/26/2020   LDLCALC 52 04/26/2020   TRIG 168 (H) 04/26/2020   CHOLHDL 3.0 04/26/2020       In-Office Bg 448 mg/dL   CT 12/27/2017 Adrenals/Urinary Tract: 3.0 cm right adrenal adenoma, grossly unchanged. Left adrenal gland is within normal limits.    ASSESSMENT / PLAN / RECOMMENDATIONS:   1) Type 2 Diabetes Mellitus, Poorly controlled, Without complications - Most recent A1c of 9.3 %. Goal A1c < 7.0 %.    Plan: GENERAL: I have discussed with the patient the pathophysiology of diabetes. We went over the natural progression of the disease. We talked about both insulin resistance and insulin deficiency. We stressed the importance of lifestyle changes including diet and exercise. I explained the complications associated with diabetes including retinopathy, nephropathy, neuropathy as well as increased risk of cardiovascular disease. We went over the benefit seen with glycemic control.     I explained to the patient that diabetic patients are at higher than normal risk for amputations.   She developed pancreatitis to DPP-4 inhibitors per chart   She has Intolerant to [place] in the form of abdominal pain and back pains, I have explained to her that these are not known side effects and would like to give SGLT-2 inhibitors another try which she is in agreement of this. Cautioned against genital infections   We emphasized the importance of low carb diet and avoiding sugar sweetened beverages, a referral to our RD has been initiated.    MEDICATIONS: -  Start Farxiga 5 mg, 1 tablet daily with Breakfast  - Continue Metformin 500 mg XR 2 tablet with breakfast  - Continue Basaglar 80 units twice daily     EDUCATION / INSTRUCTIONS:  BG monitoring instructions: Patient is instructed to check her blood sugars 1 times a day, fasting .  Call [facility] if: BG persistently < 70  . I reviewed the Rule of 15 for the treatment of hypoglycemia in detail with the patient. Literature supplied.   2) Diabetic complications:   Eye: Does not have known diabetic retinopathy.   Neuro/ Feet: Does not have known diabetic peripheral neuropathy.  Renal: Patient does not have known baseline CKD. She is not on an ACEI/ARB at present.   4) Right adrenal adenoma:   - Eighty five percent of adrenal adenomas are nonsecretory.  - Three forms of adrenal hyperfunction should be considered in patients with adrenal incidentaloma   Glucocorticoid hypersecretion  Primary hyperaldosteronism  Pheochromocytoma   Will proceed with [place]: renin and plasma metanephrine checks. Will proceed with 24 hr urine collection for cortisol - Size slightly increased from 2.8 to 3 cm over a 2 yr period - Will consider repeat imaging for stability      F/U in 3 months     Signed electronically by: Elenora Gamma  Kelton Pillar, MD  [facility]  [facility] [address] [place], [place] [address] Phone: [phone] FAX: [phone]   CC: Hali Marry, [place] [address] Phone: [phone]  Fax: [phone]    Return to Endocrinology clinic as below: Future Appointments  Date Time Provider [place]  01/27/2021  7:50 AM Madilyn Fireman Rene Kocher, MD [facility] None

## 2020-12-31 NOTE — Patient Instructions (Addendum)
-   Start Farxiga 5 mg, 1 tablet daily with Breakfast  - Continue Metformin 500 mg XR 2 tablet with breakfast  - Continue Basaglar 80 units twice daily      HOW TO TREAT LOW BLOOD SUGARS (Blood sugar LESS THAN 70 MG/DL)  Please follow the RULE OF 15 for the treatment of hypoglycemia treatment (when your (blood sugars are less than 70 mg/dL)    STEP 1: Take 15 grams of carbohydrates when your blood sugar is low, which includes:   3-4 GLUCOSE TABS  OR  3-4 OZ OF JUICE OR REGULAR SODA OR  ONE TUBE OF GLUCOSE GEL     STEP 2: RECHECK blood sugar in 15 MINUTES STEP 3: If your blood sugar is still low at the 15 minute recheck --> then, go back to STEP 1 and treat AGAIN with another 15 grams of carbohydrates.    24-Hour Urine Collection   You will be collecting your urine for a 24-hour period of time.  Your timer starts with your first urine of the morning (For example - If you first pee at [place], your timer will start at [place])  [place] away your first urine of the morning  Collect your urine every time you pee for the next 24 hours STOP your urine collection 24 hours after you started the collection (For example - You would stop at 9AM the day after you started)

## 2021-01-01 LAB — BASIC METABOLIC PANEL
BUN/Creatinine Ratio: 10 (ref 9–23)
BUN: 6 mg/dL (ref 6–24)
CO2: 21 mmol/L (ref 20–29)
Calcium: 9.3 mg/dL (ref 8.7–10.2)
Chloride: 102 mmol/L (ref 96–106)
Creatinine, Ser: 0.59 mg/dL (ref 0.57–1.00)
GFR calc Af Amer: 119 mL/min/{1.73_m2} (ref >59)
GFR calc non Af Amer: 104 mL/min/{1.73_m2} (ref >59)
Glucose: 335 mg/dL — ABNORMAL HIGH (ref 65–99)
Potassium: 4.4 mmol/L (ref 3.5–5.2)
Sodium: 139 mmol/L (ref 134–144)

## 2021-01-01 LAB — MICROALBUMIN / CREATININE URINE RATIO
Creatinine, Urine: 35.2 mg/dL
Microalb/Creat Ratio: <9 mg/g creat (ref 0–29)
Microalbumin, Urine: <3 ug/mL

## 2021-01-01 NOTE — Telephone Encounter (Signed)
Patient is requesting to bring in a urine specimen to be tested due to lower abdominal pain and frequent urination. Please advise

## 2021-01-01 NOTE — Telephone Encounter (Signed)
Patient PCP is Dr Madilyn Fireman who is not with [facility] so not sure she can use our lab and still be managed by Dr Madilyn Fireman? Do not think Dr Kelton Pillar whom she sees in our office will be willing to manage a UTI as she is only endocrinology. Seems the best thing is for her to do her urinalysis with Dr Gardiner Ramus office but am willing to discuss this work flow moving forward. Not sure what lab Dr Madilyn Fireman usually uses

## 2021-01-02 NOTE — Telephone Encounter (Signed)
Unfortunately a 24 hour is different from a UA and culture for symptoms. Still think she should do that with Dr Madilyn Fireman

## 2021-01-02 NOTE — Telephone Encounter (Signed)
Pt was doing a 24 hr for Dr. Kelton Pillar.

## 2021-01-03 ENCOUNTER — Other Ambulatory Visit (INDEPENDENT_AMBULATORY_CARE_PROVIDER_SITE_OTHER): Payer: Managed Care, Other (non HMO)

## 2021-01-03 ENCOUNTER — Other Ambulatory Visit: Payer: Self-pay

## 2021-01-03 DIAGNOSIS — D3501 Benign neoplasm of right adrenal gland: Secondary | ICD-10-CM | POA: Diagnosis not present

## 2021-01-03 NOTE — Telephone Encounter (Signed)
It looks like Dr. Kelton Pillar wanted her to a 24hr urine based on her last note.

## 2021-01-06 LAB — ALDOSTERONE + RENIN ACTIVITY W/ RATIO
ALDOS/RENIN RATIO: 7.4 (ref 0.0–30.0)
ALDOSTERONE: 10.6 ng/dL (ref 0.0–30.0)
Renin: 1.442 ng/mL/hr (ref 0.167–5.380)

## 2021-01-07 LAB — CREATININE, URINE, 24 HOUR
Creatinine, 24H Ur: 1166 mg/24 hr (ref 800–1800)
Creatinine, Urine: 64.8 mg/dL

## 2021-01-10 LAB — CORTISOL, URINE, FREE: Cortisol,F,ug/L,U: 44 ug/L

## 2021-01-13 ENCOUNTER — Telehealth: Payer: Self-pay | Admitting: Internal Medicine

## 2021-01-13 DIAGNOSIS — R7989 Other specified abnormal findings of blood chemistry: Secondary | ICD-10-CM | POA: Insufficient documentation

## 2021-01-13 MED ORDER — FLUCONAZOLE 150 MG PO TABS: 150.0000 mg | ORAL_TABLET | Freq: Every day | ORAL | 0 refills | Status: DC

## 2021-01-13 NOTE — Telephone Encounter (Signed)
Discussed elevated 24-hr urinary cortisol   Will proceed with salivary cortisol x2   She is having vaginal itching, severe with no discharge  Fluconazole will be sent    Abby Nena Jordan, MD  [facility]  [facility] [address] [place], [place] [address] Phone: [phone] FAX: [phone]

## 2021-01-14 ENCOUNTER — Other Ambulatory Visit: Payer: Self-pay

## 2021-01-15 ENCOUNTER — Other Ambulatory Visit: Payer: Self-pay

## 2021-01-15 ENCOUNTER — Other Ambulatory Visit: Payer: Managed Care, Other (non HMO)

## 2021-01-15 LAB — METANEPHRINES, PLASMA
Metanephrine, Free: 53.4 pg/mL (ref 0.0–88.0)
Normetanephrine, Free: 181.5 pg/mL (ref 0.0–244.0)

## 2021-01-15 NOTE — Telephone Encounter (Signed)
Error

## 2021-01-20 ENCOUNTER — Other Ambulatory Visit (INDEPENDENT_AMBULATORY_CARE_PROVIDER_SITE_OTHER): Payer: Managed Care, Other (non HMO)

## 2021-01-20 ENCOUNTER — Other Ambulatory Visit: Payer: Self-pay

## 2021-01-20 DIAGNOSIS — R7989 Other specified abnormal findings of blood chemistry: Secondary | ICD-10-CM | POA: Diagnosis not present

## 2021-01-27 ENCOUNTER — Ambulatory Visit: Payer: Managed Care, Other (non HMO) | Admitting: Family Medicine

## 2021-01-28 LAB — SALIVARY CORTISOL X2, TIMED
Salivary Cortisol 2nd Specimen: 0.165 ug/dL
Salivary Cortisol Baseline: 0.064 ug/dL

## 2021-01-29 ENCOUNTER — Telehealth: Payer: Self-pay | Admitting: Internal Medicine

## 2021-01-29 NOTE — Telephone Encounter (Signed)
Ask her to stop the [place] , I am assuming this is the second time ( since I already tried to treat her the first time )   Let her know I will be in touch  about the next step for her hormone issue

## 2021-01-29 NOTE — Telephone Encounter (Signed)
Spoken to patient and notified Dr Shamleffer's comments. Verbalized understanding.   

## 2021-01-29 NOTE — Telephone Encounter (Signed)
Spoken to patient and she stated that she did one swab at 12 am on Saturday then the second at 12 am Sunday. Then return it to the lab on Monday at [facility] office.  I have inform patient to contact her pcp on further assistance regarding yeast infection. She stated that it does not make sense because it is the [place] that cause the yeast infection. I try to explain that the infection could be from something else since still she having symptoms and it will best for her contact pcp who can do more examination/tests. She stated whatever then hung up.

## 2021-01-29 NOTE — Telephone Encounter (Signed)
Terri Zimmerman,   Can you please ask her about what time did she do the swabs ?  She is supposed to have done them on TWO SEPARATE night and at midnight ? If she did them correctly, then she will need more testing but she has to wait on me to find the next step

## 2021-01-29 NOTE — Telephone Encounter (Signed)
Please advise 

## 2021-01-29 NOTE — Telephone Encounter (Signed)
Pt called because she received the results for her mouth swab test and does not understand it. She requests a call at [phone] so that a nurse or Dr can go over them with her.  ALSO-  Dr. Kelton Pillar gave her a prescription pill to help with yeast infections, however it only lasts a couple days and so she needs a refill and/or a cream that will last longer and help with her discomfort she is having again.  [id number] [address], [place] Phone:  [phone]  Fax:  [phone]

## 2021-01-30 ENCOUNTER — Telehealth: Payer: Self-pay | Admitting: Internal Medicine

## 2021-01-30 DIAGNOSIS — R7989 Other specified abnormal findings of blood chemistry: Secondary | ICD-10-CM

## 2021-01-30 DIAGNOSIS — Z794 Long term (current) use of insulin: Secondary | ICD-10-CM

## 2021-01-30 DIAGNOSIS — E1165 Type 2 diabetes mellitus with hyperglycemia: Secondary | ICD-10-CM

## 2021-01-30 MED ORDER — INSULIN LISPRO (1 UNIT DIAL) 100 UNIT/ML (KWIKPEN): 10.0000 [IU] | PEN_INJECTOR | Freq: Three times a day (TID) | SUBCUTANEOUS | 6 refills | Status: DC

## 2021-01-30 NOTE — Telephone Encounter (Addendum)
  Spoke to the pt on 01/30/2021   Discussed inconclusive cortisol results with 24- hr urine slightly elevated and one  Salivary cortisol abnormal and the other one was normal.      Results for Terri Zimmerman, Terri Zimmerman (MRN [id number]) as of 01/30/2021 08:17  Ref. Range 01/03/2021 07:58  Cortisol,F,ug/L,U Latest Ref Range: Undefined ug/L 44   Salivary Cortisol Baseline ug/dL 0.064   Comment: This test was developed and its performance characteristics  determined by [facility]. It has not been cleared or approved  by the Food and Drug Administration.  Draw date/time: 01/18/21  midnight  Reference Range:  Children and Adults:  8:00a.m.:  0.025 - 0.600  Noon:   <0.010 - 0.330  4:00p.m.:  0.010 - 0.200  Midnight: <0.010 - 0.090   Salivary Cortisol 2nd Specimen ug/dL 0.165   Comment: Draw date/time: 01/19/21  midnight    We discussed the possibility of  false results . We have agreed to improve glycemic control and treat infections as infections, and stress would cause high cortisol level as well as depression     She stopped the [place] due to recurrent vaginal symptoms, today he symptoms have already improved Will start prandial insulin   Humalog 10 units with each meal in addition to Metformin and Basaglar   Will recheck cortisol levels once her glucose has improved and infection have resolved.   Abby Nena Jordan, MD  [facility]  [facility] [address] [place], [place] [address] Phone: [phone] FAX: [phone]

## 2021-02-11 ENCOUNTER — Telehealth (INDEPENDENT_AMBULATORY_CARE_PROVIDER_SITE_OTHER): Payer: Managed Care, Other (non HMO) | Admitting: Family Medicine

## 2021-02-11 ENCOUNTER — Encounter: Payer: Self-pay | Admitting: Family Medicine

## 2021-02-11 VITALS — BP 139/89 | HR 100

## 2021-02-11 DIAGNOSIS — J0140 Acute pansinusitis, unspecified: Secondary | ICD-10-CM

## 2021-02-11 MED ORDER — AMOXICILLIN-POT CLAVULANATE 875-125 MG PO TABS: 1.0000 | ORAL_TABLET | Freq: Two times a day (BID) | ORAL | 0 refills | Status: AC

## 2021-02-11 NOTE — Progress Notes (Signed)
Virtual Video Visit via MyChart Note  I connected with  Terri Zimmerman on 02/11/21 at  8:10 AM EDT by the video enabled telemedicine application for MyChart, and verified that I am speaking with the correct person using two identifiers.   I introduced myself as a Designer, jewellery with the practice. We discussed the limitations of evaluation and management by telemedicine and the availability of in person appointments. The patient expressed understanding and agreed to proceed.  Participating parties in this visit include: The patient and the nurse practitioner listed.  The patient is: At home I am: In the office - Primary Care [facility]  Subjective:    CC:  Chief Complaint  Patient presents with  . Sinusitis  . Cough    HPI: Terri Zimmerman is a 55 y.o. year old female presenting today via [place] today for cough and sinusitis.  Patient reporting almost 2 weeks of sinusitis symptoms and cough. She has had significant nasal congestion/stuffiness, frontal/maxillary sinus pressure 4-8/10 fluctuating throughout the day, often worse at night when trying to sleep, fatigue, and non-productive cough that she feels is more of clearing her throat from drainage than a chest cough. Her ears have felt full occasionally, but no ear pain. Reports her nasal mucus has changed from clear to purulent over the past couple of days. She has had some occasional sneezing, but nothing significant. No itchy nose, throat, or eyes and no history of seasonal allergies. She has not had any fevers, headaches, body aches, GI symptoms, chest pain or shortness of breath.  She has taken Afrin for the past 3 days which has helped with the nasal congestion temporarily, but she knows she cannot continue this long-term. She has been taking night-time Mucinex which has helped her sleep for a few hours at a time.   She had a negative COVID test at home today. She was with her grandson who had a cold about 2-3 weeks ago. No other  sick contacts.   Past medical history, Surgical history, Family history not pertinant except as noted below, Social history, Allergies, and medications have been entered into the medical record, reviewed, and corrections made.   Review of Systems:  All review of systems negative except what is listed in the HPI   Objective:    General:  Speaking clearly in complete sentences. Absent shortness of breath noted.   Alert and oriented x3.   Normal judgment.  Absent acute distress.   Impression and Recommendations:    1. Acute non-recurrent pansinusitis - amoxicillin-clavulanate (AUGMENTIN) 875-125 MG tablet; Take 1 tablet by mouth 2 (two) times daily for 5 days.  Dispense: 10 tablet; Refill: 0  Patient with duration of symptoms lasting almost 2 weeks now. Will treat as sinusitis with Augmentin. Recommend she continue the Mucinex and start using some Flonase instead of continuing with Afrin. Encouraged her to continue supportive care including rest, hydration, humidifier use or steam showers, warm compresses to relieve sinus pressure, and OTC analgesics as needed. Educated on signs and symptoms requiring further evaluation.    Follow-up if symptoms worsen or fail to improve.    I discussed the assessment and treatment plan with the patient. The patient was provided an opportunity to ask questions and all were answered. The patient agreed with the plan and demonstrated an understanding of the instructions.   The patient was advised to call back or seek an in-person evaluation if the symptoms worsen or if the condition fails to improve as anticipated.  I  provided 20 minutes of non-face-to-face interaction with this [place] visit including intake, same-day documentation, and chart review.   Terrilyn Saver, NP

## 2021-02-18 ENCOUNTER — Other Ambulatory Visit: Payer: Self-pay | Admitting: Family Medicine

## 2021-02-24 ENCOUNTER — Other Ambulatory Visit: Payer: Self-pay | Admitting: Family Medicine

## 2021-02-24 DIAGNOSIS — R200 Anesthesia of skin: Secondary | ICD-10-CM

## 2021-02-24 DIAGNOSIS — M2550 Pain in unspecified joint: Secondary | ICD-10-CM

## 2021-02-24 DIAGNOSIS — M791 Myalgia, unspecified site: Secondary | ICD-10-CM

## 2021-03-19 ENCOUNTER — Ambulatory Visit (INDEPENDENT_AMBULATORY_CARE_PROVIDER_SITE_OTHER): Payer: Managed Care, Other (non HMO) | Admitting: Sports Medicine

## 2021-03-19 ENCOUNTER — Other Ambulatory Visit: Payer: Self-pay

## 2021-03-19 DIAGNOSIS — M65331 Trigger finger, right middle finger: Secondary | ICD-10-CM

## 2021-03-19 DIAGNOSIS — M65321 Trigger finger, right index finger: Secondary | ICD-10-CM

## 2021-03-19 DIAGNOSIS — M65341 Trigger finger, right ring finger: Secondary | ICD-10-CM

## 2021-03-19 DIAGNOSIS — M65312 Trigger thumb, left thumb: Secondary | ICD-10-CM

## 2021-03-19 DIAGNOSIS — M65352 Trigger finger, left little finger: Secondary | ICD-10-CM

## 2021-03-19 DIAGNOSIS — M65342 Trigger finger, left ring finger: Secondary | ICD-10-CM

## 2021-03-19 DIAGNOSIS — M65311 Trigger thumb, right thumb: Secondary | ICD-10-CM

## 2021-03-19 DIAGNOSIS — E6609 Other obesity due to excess calories: Secondary | ICD-10-CM | POA: Diagnosis not present

## 2021-03-19 DIAGNOSIS — Z6836 Body mass index (BMI) 36.0-36.9, adult: Secondary | ICD-10-CM

## 2021-03-19 DIAGNOSIS — M65322 Trigger finger, left index finger: Secondary | ICD-10-CM

## 2021-03-19 DIAGNOSIS — E66812 Obesity, class 2: Secondary | ICD-10-CM

## 2021-03-19 DIAGNOSIS — M79671 Pain in right foot: Secondary | ICD-10-CM

## 2021-03-19 DIAGNOSIS — M65351 Trigger finger, right little finger: Secondary | ICD-10-CM

## 2021-03-19 DIAGNOSIS — M65332 Trigger finger, left middle finger: Secondary | ICD-10-CM

## 2021-03-19 MED ORDER — PREDNISONE 50 MG PO TABS: ORAL_TABLET | ORAL | 0 refills | Status: DC

## 2021-03-19 MED ORDER — MELOXICAM 15 MG PO TABS: ORAL_TABLET | ORAL | 3 refills | Status: DC

## 2021-03-19 NOTE — Assessment & Plan Note (Addendum)
Pain along the lateral aspect of her right foot, this is likely due to pressure, I do not think she has a stress injury, she will avoid barefoot walking and wear supportive athletic shoes through the day. Adding meloxicam to follow her prednisone. Return to see me in a month for this.

## 2021-03-19 NOTE — Progress Notes (Signed)
    Procedures performed today:    None.  Independent interpretation of notes and tests performed by another provider:   None.  Brief History, Exam, Impression, and Recommendations:    Trigger finger of left thumb and right ring finger Discussed the pathophysiology, painful triggering with tenosynovitis. Adding home rehab exercises, 5 days of prednisone. Return to see me in a month, injections if not better.  Right foot pain Pain along the lateral aspect of her right foot, this is likely due to pressure, I do not think she has a stress injury, she will avoid barefoot walking and wear supportive athletic shoes through the day. Adding meloxicam to follow her prednisone. Return to see me in a month for this.  Class 2 obesity due to excess calories without serious comorbidity with body mass index (BMI) of 36.0 to 36.9 in adult I would like her to discuss her weight with Dr. Madilyn Fireman, hopefully they could consider some weight loss medications which can help control her diabetes as well. This will also be helpful for her right foot pain.    ___________________________________________ Gwen Her. Dianah Field, M.D., ABFM., CAQSM. Primary Care and [place] Instructor of [employer] [employer] [facility] [employer]

## 2021-03-19 NOTE — Assessment & Plan Note (Signed)
Discussed the pathophysiology, painful triggering with tenosynovitis. Adding home rehab exercises, 5 days of prednisone. Return to see me in a month, injections if not better.

## 2021-03-19 NOTE — Assessment & Plan Note (Signed)
I would like her to discuss her weight with Dr. Madilyn Fireman, hopefully they could consider some weight loss medications which can help control her diabetes as well. This will also be helpful for her right foot pain.

## 2021-04-04 ENCOUNTER — Telehealth (INDEPENDENT_AMBULATORY_CARE_PROVIDER_SITE_OTHER): Payer: Managed Care, Other (non HMO) | Admitting: Family Medicine

## 2021-04-04 ENCOUNTER — Encounter: Payer: Self-pay | Admitting: Family Medicine

## 2021-04-04 VITALS — BP 135/94 | HR 100

## 2021-04-04 DIAGNOSIS — J0120 Acute ethmoidal sinusitis, unspecified: Secondary | ICD-10-CM

## 2021-04-04 MED ORDER — AMOXICILLIN-POT CLAVULANATE 875-125 MG PO TABS: 1.0000 | ORAL_TABLET | Freq: Two times a day (BID) | ORAL | 0 refills | Status: DC

## 2021-04-04 NOTE — Progress Notes (Signed)
Virtual Visit via Video Note  I connected with Terri Zimmerman on 04/04/21 at  4:20 PM EDT by a video enabled telemedicine application and verified that I am speaking with the correct person using two identifiers.   I discussed the limitations of evaluation and management by telemedicine and the availability of in person appointments. The patient expressed understanding and agreed to proceed.  Patient location: at home Provider location: in office  Subjective:    CC: Cough   HPI:  sxs are cough, nasal congestion,facial pressure,no fever. No chest pain, no SOB.  Pressure over the nasal bridge.  No fever. 1st  Test was Tuesday and 2 Thursday they were both negative.No GI sxs.  Had Augment for sinus sxs about 8 weeks ago. Was using Flonase.   Taking mucinex and tylenol, Husband has sick for 2 weeks.    Past medical history, Surgical history, Family history not pertinant except as noted below, Social history, Allergies, and medications have been entered into the medical record, reviewed, and corrections made.    Objective:    General: Speaking clearly in complete sentences without any shortness of breath.  Alert and oriented x3.  Normal judgment. No apparent acute distress.    Impression and Recommendations:    No problem-specific Assessment & Plan notes found for this encounter.  Sinusitis - sxs for about 5 days. Discussed that we usually give it at least a week before we treat with antibiotics.  We did discuss going ahead and sending a prescription for now but did really encourage her to give it a couple more days to see if she starts to feel better.  But if not okay to fill the antibiotic or if she feels like she is getting worse then okay to fill sooner.  No orders of the defined types were placed in this encounter.   Meds ordered this encounter  Medications  . amoxicillin-clavulanate (AUGMENTIN) 875-125 MG tablet    Sig: Take 1 tablet by mouth 2 (two) times daily.     Dispense:  14 tablet    Refill:  0     I discussed the assessment and treatment plan with the patient. The patient was provided an opportunity to ask questions and all were answered. The patient agreed with the plan and demonstrated an understanding of the instructions.   The patient was advised to call back or seek an in-person evaluation if the symptoms worsen or if the condition fails to improve as anticipated.   Beatrice Lecher, MD

## 2021-04-04 NOTE — Progress Notes (Signed)
1st  Test was Tuesday and 2 Thursday they were both negative.  sxs are cough, nasal congestion,facial pressure,no fever.   Taking mucinex and tylenol

## 2021-04-15 ENCOUNTER — Ambulatory Visit (INDEPENDENT_AMBULATORY_CARE_PROVIDER_SITE_OTHER): Payer: Managed Care, Other (non HMO) | Admitting: Internal Medicine

## 2021-04-15 ENCOUNTER — Encounter: Payer: Self-pay | Admitting: Internal Medicine

## 2021-04-15 ENCOUNTER — Other Ambulatory Visit: Payer: Self-pay

## 2021-04-15 VITALS — BP 140/80 | HR 89 | Ht 61.0 in | Wt 194.0 lb

## 2021-04-15 DIAGNOSIS — Z794 Long term (current) use of insulin: Secondary | ICD-10-CM | POA: Diagnosis not present

## 2021-04-15 DIAGNOSIS — E1165 Type 2 diabetes mellitus with hyperglycemia: Secondary | ICD-10-CM

## 2021-04-15 DIAGNOSIS — M2550 Pain in unspecified joint: Secondary | ICD-10-CM | POA: Insufficient documentation

## 2021-04-15 DIAGNOSIS — D3501 Benign neoplasm of right adrenal gland: Secondary | ICD-10-CM | POA: Diagnosis not present

## 2021-04-15 LAB — POCT GLYCOSYLATED HEMOGLOBIN (HGB A1C): Hemoglobin A1C: 9.6 % — AB (ref 4.0–5.6)

## 2021-04-15 LAB — POCT GLUCOSE (DEVICE FOR HOME USE): POC Glucose: 191 mg/dl — AB (ref 70–99)

## 2021-04-15 MED ORDER — DEXCOM G6 TRANSMITTER MISC: 1.0000 | 3 refills | Status: DC

## 2021-04-15 MED ORDER — DEXCOM G6 SENSOR MISC: 1.0000 | 3 refills | Status: DC

## 2021-04-15 MED ORDER — METFORMIN HCL ER 500 MG PO TB24: ORAL_TABLET | ORAL | 1 refills | Status: DC

## 2021-04-15 MED ORDER — INSULIN LISPRO (1 UNIT DIAL) 100 UNIT/ML (KWIKPEN)
PEN_INJECTOR | SUBCUTANEOUS | 6 refills | Status: DC
Start: 2021-04-15 — End: 2021-12-01

## 2021-04-15 NOTE — Progress Notes (Signed)
Name: Terri Zimmerman  Age/ Sex: 55 y.o., female   MRN/ DOB: [id number], 1966-01-11     PCP: Hali Marry, MD   Reason for Endocrinology Evaluation: Type 2 Diabetes Mellitus  Initial Endocrine Consultative Visit: 12/31/2020    PATIENT IDENTIFIER: [name] is a 55 y.o. female with a past medical history of T2DM,HTN, Hx of Pancreatitis  . The patient has followed with Endocrinology clinic since 12/31/2020 for consultative assistance with management of her diabetes.  DIABETIC HISTORY:  Terri Zimmerman was diagnosed with DM  In 2009, Ozempic/ Victoza Abdominal pain/diarrhea, Invokana - back pain. Onglyza- Pancreatitis. Farxiga - genital infections. Has been on insulin since her diagnosis.  Her hemoglobin A1c has ranged from 6.8 % in 2017, peaking at 9.4% in 2021.  On her initial visit to our clinic she had an A1c of 9.3% . She was on Metformin and basaglar, she also had a prescription of prandial insulin but was not taking. We attempted a small dose of Farxiga but this has to be discontinued due to genital infection.   ADRENAL HISTORY: She had an incidental finding of a right adrenal adenoma on abdominal CT 2017 measuring 2.8 cm during evaluation of abdominal pain.  Pheo has been ruled out with normal metanephrines and nor metanephrines , aldo: renin normal ( 12/2020) She had elevated 24-hr urinary cortisol at 44 ( reference for [facility] 42)     SUBJECTIVE:   During the last visit (12/31/2020): A1c  9.3% continued metformin and basaglar and started prandial insulin   Today (04/15/2021): Terri Zimmerman is here for a follow up on diabetes management.  She checks her blood sugars 2 times daily. The patient has not had hypoglycemic episodes since the last clinic visit.  She has chronic GI issues, that has improved. Denies nausea, constipation or diarrhea   She has right ear pain and right throat pain, took two rounds of Abx  Recent diagnosis of trigger finger, was on Prednisone for 5 days  early May.    She has protein shake and granola bar mid morning . Takes prandial with lunch and supper     HOME DIABETES REGIMEN:  Metformin 500 mg XR 2 tabs with Breakfast  Basagalr 80 units BID  Humalog 10 units with each meal   Statin: yes ACE-I/ARB: yes Prior Diabetic Education: yes       DIABETIC COMPLICATIONS: Microvascular complications:    Denies: CKD, retinopathy, neuropathy  Last Eye Exam: Completed 06/2020  Macrovascular complications:    Denies: CAD, CVA, PVD   HISTORY:  Past Medical History:  Past Medical History:  Diagnosis Date  . Asthma 04-25-07  . Diabetes mellitus 11-12-06   type 2  . Hypertension 11-04-06   Past Surgical History:  Past Surgical History:  Procedure Laterality Date  . ABDOMINAL HYSTERECTOMY  12-11   Complete  . APPENDECTOMY  9-00  . BREAST BIOPSY Left   . BREAST CYST EXCISION Left   . BREAST SURGERY  4-00   of cyst  . FOOT TENDON SURGERY  05/16/2012   bone spur.    [facility] Kitchen LAPAROSCOPY  4-08   for cyst and endometriosis  . left breast bx  12-11    Social History:  reports that she has been smoking cigarettes. She has been smoking about 0.50 packs per day. She has never used smokeless tobacco. She reports that she does not drink alcohol and does not use drugs. Family History:  Family History  Problem Relation Age of Onset  .  Other Mother        RA and CHF  . Diabetes Father   . Diabetes Other   . Heart disease Other      HOME MEDICATIONS: Allergies as of 04/15/2021      Reactions   Trulicity [dulaglutide] Nausea Only   Abdominal pain,   Jentadueto [linagliptin-metformin Hcl Er]    Abdominal pains and diarrhea   Lipitor [atorvastatin]    Sudden hair loss 2 weeks after starting lipitor.    Onglyza [saxagliptin] Other (See Comments)   Pancreatitis   Ozempic (0.25 Or 0.5 Mg-dose) [semaglutide(0.25 Or 0.5mg -dos)] Diarrhea, Other (See Comments)   Abdominal pain   Victoza [liraglutide] Other (See Comments)   Abdominal  pain and nausea.       Medication List       Accurate as of Apr 15, 2021  8:02 AM. If you have any questions, ask your nurse or doctor.        STOP taking these medications   amoxicillin-clavulanate 875-125 MG tablet Commonly known as: AUGMENTIN Stopped by: Dorita Sciara, MD     TAKE these medications   acetaminophen 325 MG tablet Commonly known as: TYLENOL Take 650 mg by mouth 2 (two) times daily.   AMBULATORY NON FORMULARY MEDICATION 1 each by Other route 2 (two) times daily. Lancets for daily glucose checks Diagnosis: E11.9 Testing 2 times a day   B-D UF III MINI PEN NEEDLES 31G X 5 MM Misc Generic drug: Insulin Pen Needle USE TO INJECT INSULIN UP TO THREE (3) TIMES DAILY. DIAGNOSIS DM E11.9   Basaglar KwikPen 100 UNIT/ML INJECT 80 UNITS INTO THE SKIN TWICE DAILY. DO NOT MIX WITH OTHER INSULINS   Dexcom G6 Sensor Misc 1 Device by Does not apply route as directed. Started by: Dorita Sciara, MD   Dexcom G6 Transmitter Misc 1 Device by Does not apply route as directed. Started by: Dorita Sciara, MD   glucose blood test strip Commonly known as: ONE TOUCH ULTRA TEST USE TO TEST BLOOD SUGARS 2 TIMES A DAY. DX:E11.8   insulin lispro 100 UNIT/ML KwikPen Commonly known as: HumaLOG KwikPen Inject 5 Units into the skin daily with breakfast AND 10 Units daily with lunch AND 12 Units daily with supper. What changed: See the new instructions. Changed by: Dorita Sciara, MD   lisinopril 20 MG tablet Commonly known as: ZESTRIL TAKE 1 TABLET BY MOUTH EVERY DAY   metFORMIN 500 MG 24 hr tablet Commonly known as: GLUCOPHAGE-XR Take 2 tablets (1,000 mg total) by mouth daily with breakfast AND 1 tablet (500 mg total) daily with supper. What changed: See the new instructions. Changed by: Dorita Sciara, MD   metoprolol succinate 25 MG 24 hr tablet Commonly known as: TOPROL-XL TAKE 1 TABLET BY MOUTH EVERYDAY AT BEDTIME   OneTouch Delica  Lancets 83T Misc UAD AS DIRECTED        OBJECTIVE:   Vital Signs: BP 140/80   Pulse 89   Ht 5\' 1"  (1.549 m)   Wt 194 lb (88 kg)   SpO2 98%   BMI 36.66 kg/m   Wt Readings from Last 3 Encounters:  04/15/21 194 lb (88 kg)  12/31/20 192 lb 2 oz (87.1 kg)  10/28/20 191 lb (86.6 kg)     Exam: General: Pt appears well and is in NAD  Neck: General: Supple without adenopathy. Thyroid: Thyroid size normal.  No goiter or nodules appreciated.   Lungs: Clear with good BS bilat with no  rales, rhonchi, or wheezes  Heart: RRR    Abdomen: Normoactive bowel sounds, soft, nontender, without masses or organomegaly palpable  Extremities: No pretibial edema. .  Neuro: MS is good with appropriate affect, pt is alert and Ox3    DM foot exam: 04/15/2021  The skin of the feet is intact without sores or ulcerations. The pedal pulses are 2+ on right and 2+ on left. The sensation is intact to a screening 5.07, 10 gram monofilament bilaterally   DATA REVIEWED:  Lab Results  Component Value Date   HGBA1C 9.6 (A) 04/15/2021   HGBA1C 9.3 (A) 12/31/2020   HGBA1C 9.4 (A) 10/28/2020   Lab Results  Component Value Date   MICROALBUR 30 07/27/2018   LDLCALC 52 04/26/2020   CREATININE 0.59 12/31/2020   Lab Results  Component Value Date   MICRALBCREAT <9 12/31/2020     Lab Results  Component Value Date   CHOL 116 04/26/2020   HDL 39 (L) 04/26/2020   LDLCALC 52 04/26/2020   TRIG 168 (H) 04/26/2020   CHOLHDL 3.0 04/26/2020       CT 12/27/2017 Adrenals/Urinary Tract: 3.0 cm right adrenal adenoma, grossly unchanged. Left adrenal gland is within normal limits.    ASSESSMENT / PLAN / RECOMMENDATIONS:   1) Type 2 Diabetes Mellitus, Poorly controlled, Without complications - Most recent A1c of 9.6 %. Goal A1c < 7.0 %.    - She developed pancreatitis to DPP-4 inhibitors per chart . GLP-1 agonists and DPP-4 inhibitors are contra-indicated  - Despite starting prandial insulin her A1c is  trending up, will make the following changes, despite reporting " not eating Breakfast " she has a granola bar and protein shake at 10 Am in the morning without prandial coverage  - Will prescribe Dexcom  - Will also increase Metformin   MEDICATIONS: - Increase Metformin 500 mg XR 2 tablet with breakfast and 1 tablet with Supper - Continue Basaglar 80 units twice daily  - HUmalog 5 units with Breakfast snack,10 units with Lunch and 12 units with Supper      EDUCATION / INSTRUCTIONS:  BG monitoring instructions: Patient is instructed to check her blood sugars 3 times a day, before meals .  Call [facility] if: BG persistently < 70 . I reviewed the Rule of 15 for the treatment of hypoglycemia in detail with the patient. Literature supplied.    2) Diabetic complications:   Eye: Does not have known diabetic retinopathy.   Neuro/ Feet: Does not have known diabetic peripheral neuropathy.  Renal: Patient does not have known baseline CKD. She is not on an ACEI/ARB at present.   4) Right adrenal adenoma:   - Eighty five percent of adrenal adenomas are nonsecretory.  - Size slightly increased from 2.8 to 3 cm over a 2 yr period - Will consider repeat imaging for stability  - Aldo: renin and metanephrines are negative  - Her 24-hr urinary cortisol at 44 ug/L per text book this is normal but per [facility] this is slightly above goal of 42 ug/L. She recently received prednisone, will hold off on repeat 24-hr urinary cortisol for another 3 months.    F/U in 3 months    Signed electronically by: Mack Guise, MD  [facility]  [facility] [address] [place], [place] [address] Phone: [phone] FAX: [phone]   CC: Hali Marry, [place] [address] Phone: [phone]  Fax: [phone]  Return to Endocrinology clinic as below: No future appointments.

## 2021-04-15 NOTE — Patient Instructions (Signed)
-   Increase Metformin 500 mg XR 2 tablet with breakfast and 1 tablet with Supper - Continue Basaglar 80 units twice daily  - HUmalog 5 units with Breakfast snack,10 units with Lunch and 12 units with Supper      HOW TO TREAT LOW BLOOD SUGARS (Blood sugar LESS THAN 70 MG/DL)  Please follow the RULE OF 15 for the treatment of hypoglycemia treatment (when your (blood sugars are less than 70 mg/dL)    STEP 1: Take 15 grams of carbohydrates when your blood sugar is low, which includes:   3-4 GLUCOSE TABS  OR  3-4 OZ OF JUICE OR REGULAR SODA OR  ONE TUBE OF GLUCOSE GEL     STEP 2: RECHECK blood sugar in 15 MINUTES STEP 3: If your blood sugar is still low at the 15 minute recheck --> then, go back to STEP 1 and treat AGAIN with another 15 grams of carbohydrates.    24-Hour Urine Collection   You will be collecting your urine for a 24-hour period of time.  Your timer starts with your first urine of the morning (For example - If you first pee at [place], your timer will start at [place])  [place] away your first urine of the morning  Collect your urine every time you pee for the next 24 hours STOP your urine collection 24 hours after you started the collection (For example - You would stop at 9AM the day after you started)

## 2021-04-16 ENCOUNTER — Ambulatory Visit: Payer: Managed Care, Other (non HMO) | Admitting: Sports Medicine

## 2021-04-23 ENCOUNTER — Encounter: Payer: Self-pay | Admitting: Physician Assistant

## 2021-04-23 ENCOUNTER — Other Ambulatory Visit: Payer: Self-pay

## 2021-04-23 ENCOUNTER — Ambulatory Visit (INDEPENDENT_AMBULATORY_CARE_PROVIDER_SITE_OTHER): Payer: Managed Care, Other (non HMO)

## 2021-04-23 ENCOUNTER — Ambulatory Visit (INDEPENDENT_AMBULATORY_CARE_PROVIDER_SITE_OTHER): Payer: Managed Care, Other (non HMO) | Admitting: Physician Assistant

## 2021-04-23 VITALS — BP 152/81 | HR 103 | Temp 98.3°F | Ht 61.0 in | Wt 196.0 lb

## 2021-04-23 DIAGNOSIS — R600 Localized edema: Secondary | ICD-10-CM

## 2021-04-23 DIAGNOSIS — M7989 Other specified soft tissue disorders: Secondary | ICD-10-CM | POA: Diagnosis not present

## 2021-04-23 DIAGNOSIS — R053 Chronic cough: Secondary | ICD-10-CM | POA: Diagnosis not present

## 2021-04-23 DIAGNOSIS — I493 Ventricular premature depolarization: Secondary | ICD-10-CM

## 2021-04-23 DIAGNOSIS — R0602 Shortness of breath: Secondary | ICD-10-CM

## 2021-04-23 DIAGNOSIS — I10 Essential (primary) hypertension: Secondary | ICD-10-CM

## 2021-04-23 DIAGNOSIS — I499 Cardiac arrhythmia, unspecified: Secondary | ICD-10-CM | POA: Diagnosis not present

## 2021-04-23 DIAGNOSIS — Z720 Tobacco use: Secondary | ICD-10-CM | POA: Diagnosis not present

## 2021-04-23 LAB — POCT UA - MICROALBUMIN
Albumin/Creatinine Ratio, Urine, POC: 30
Creatinine, POC: 300 mg/dL
Microalbumin Ur, POC: 80 mg/L

## 2021-04-23 MED ORDER — PREDNISONE 20 MG PO TABS: ORAL_TABLET | ORAL | 0 refills | Status: DC

## 2021-04-23 MED ORDER — FUROSEMIDE 20 MG PO TABS: 20.0000 mg | ORAL_TABLET | Freq: Every day | ORAL | 0 refills | Status: DC

## 2021-04-23 NOTE — Patient Instructions (Signed)
Get CXR.  Get STAT labs.

## 2021-04-23 NOTE — Progress Notes (Signed)
Subjective:    Patient ID: Terri Zimmerman, female    DOB: 1966/06/01, 55 y.o.   MRN: [id number]  HPI Pt is a 55 yo obese female with HTN, OSA, T2DM, Asthma who presents to the clinic with persistent worsening cough. It has been about 4 weeks since cough started. She has had 2 rounds of abx the last being Augmentin started on 5/20. About 4 weeks ago she did have some prednisone as well that per patient "probably seemed to help the most". She has tested negative for Covid and last had covid last September. Her cough is dry to productive. Her tongue is swollen and burns a little. No fever. She feels bloated and like her abdomen is full. She is very weak, fatigued and SOB. She continues to smoke about 1/2 pack a day. She is having some left ankle and leg edema.    Active Ambulatory Problems    Diagnosis Date Noted   Diabetes mellitus type 2 with complications ([place]) 40/97/3532   VITAMIN D DEFICIENCY 01/10/2008   Hereditary and idiopathic peripheral neuropathy 10/22/2009   Essential hypertension 11/29/2008   ALLERGIC RHINITIS 01/10/2008   ASTHMA, UNSPECIFIED, UNSPECIFIED STATUS 01/10/2008   AMENORRHEA 04/17/2010   PALPITATIONS, OCCASIONAL 04/12/2008   Tobacco abuse 05/25/2014   Patchy loss of hair 03/02/2015   De Quervain's tenosynovitis, left 02/20/2016   NAFL (nonalcoholic fatty liver) 99/24/2683   OSA (obstructive sleep apnea) 09/02/2018   Hematuria 04/18/2019   Primary osteoarthritis of right hip 04/18/2019   PVC's (premature ventricular contractions) 04/25/2019   Low back pain radiating to both legs 10/27/2019   Class 2 obesity due to excess calories without serious comorbidity with body mass index (BMI) of 36.0 to 36.9 in adult 01/22/2020   Stress at home 10/28/2020   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   Type 2 diabetes mellitus with hyperglycemia, with long-term current use of insulin ([place]) 12/31/2020   Elevated cortisol level 01/13/2021   Trigger finger of left thumb and right ring  finger 03/19/2021   Right foot pain 03/19/2021   Polyarthralgia 04/15/2021   Adrenal adenoma, right 04/15/2021   Irregular heart rhythm 04/23/2021   Cough, persistent 04/23/2021   SOB (shortness of breath) 04/23/2021   Left leg swelling 04/24/2021   Resolved Ambulatory Problems    Diagnosis Date Noted   KNEE PAIN, ACUTE 08/19/2010   Elevated BP 11/14/2015   Rash 07/02/2020   Past Medical History:  Diagnosis Date   Asthma 04-25-07   Diabetes mellitus 11-12-06   Hypertension 11-04-06    Review of Systems See HPI.     Objective:   Physical Exam Vitals reviewed.  Constitutional:      Appearance: Normal appearance. She is obese.  HENT:     Head: Normocephalic.     Right Ear: Tympanic membrane and external ear normal. There is no impacted cerumen.     Left Ear: Tympanic membrane and external ear normal. There is no impacted cerumen.     Nose: Nose normal.     Mouth/Throat:     Pharynx: No oropharyngeal exudate.  Eyes:     Pupils: Pupils are equal, round, and reactive to light.  Cardiovascular:     Rate and Rhythm: Rhythm irregular.     Pulses: Normal pulses.  Pulmonary:     Effort: Pulmonary effort is normal.     Breath sounds: Normal breath sounds.  Abdominal:     General: Bowel sounds are normal. There is distension.     Palpations: Abdomen is  soft.  Musculoskeletal:        General: Normal range of motion.     Cervical back: Neck supple.     Left lower leg: Edema present.     Comments: 1 plus pitting edema around left ankle.   Lymphadenopathy:     Cervical: No cervical adenopathy.  Neurological:     General: No focal deficit present.     Mental Status: She is alert and oriented to person, place, and time.  Psychiatric:        Mood and Affect: Mood normal.    .. Results for orders placed or performed in visit on 04/23/21  TSH  Result Value Ref Range   TSH 1.61 mIU/L  COMPLETE METABOLIC PANEL WITH GFR  Result Value Ref Range   Glucose, Bld 234 (H) 65 - 99  mg/dL   BUN 8 7 - 25 mg/dL   Creat 0.74 0.50 - 1.05 mg/dL   GFR, Est Non African American 91 > OR = 60 mL/min/1.33m2   GFR, Est African American 106 > OR = 60 mL/min/1.61m2   BUN/Creatinine Ratio NOT APPLICABLE 6 - 22 (calc)   Sodium 141 135 - 146 mmol/L   Potassium 4.1 3.5 - 5.3 mmol/L   Chloride 105 98 - 110 mmol/L   CO2 29 20 - 32 mmol/L   Calcium 9.4 8.6 - 10.4 mg/dL   Total Protein 6.4 6.1 - 8.1 g/dL   Albumin 3.8 3.6 - 5.1 g/dL   Globulin 2.6 1.9 - 3.7 g/dL (calc)   AG Ratio 1.5 1.0 - 2.5 (calc)   Total Bilirubin 0.3 0.2 - 1.2 mg/dL   Alkaline phosphatase (APISO) 97 37 - 153 U/L   AST 14 10 - 35 U/L   ALT 14 6 - 29 U/L  CBC with Differential/Platelet  Result Value Ref Range   WBC 12.4 (H) 3.8 - 10.8 Thousand/uL   RBC 5.12 (H) 3.80 - 5.10 Million/uL   Hemoglobin 13.8 11.7 - 15.5 g/dL   HCT 43.3 35.0 - 45.0 %   MCV 84.6 80.0 - 100.0 fL   MCH 27.0 27.0 - 33.0 pg   MCHC 31.9 (L) 32.0 - 36.0 g/dL   RDW 13.3 11.0 - 15.0 %   Platelets 284 140 - 400 Thousand/uL   MPV 10.1 7.5 - 12.5 fL   Neutro Abs 7,304 1,500 - 7,800 cells/uL   Lymphs Abs 4,241 (H) 850 - 3,900 cells/uL   Absolute Monocytes 645 200 - 950 cells/uL   Eosinophils Absolute 149 15 - 500 cells/uL   Basophils Absolute 62 0 - 200 cells/uL   Neutrophils Relative % 58.9 %   Total Lymphocyte 34.2 %   Monocytes Relative 5.2 %   Eosinophils Relative 1.2 %   Basophils Relative 0.5 %  POCT UA - Microalbumin  Result Value Ref Range   Microalbumin Ur, POC 80 mg/L   Creatinine, POC 300 mg/dL   Albumin/Creatinine Ratio, Urine, POC 30          Assessment & Plan:  Marland KitchenMarland KitchenChanise was seen today for cough.  Diagnoses and all orders for this visit:  Cough, persistent -     Brain natriuretic peptide -     TSH -     COMPLETE METABOLIC PANEL WITH GFR -     CBC with Differential/Platelet -     DG Chest 2 View; Future -     predniSONE (DELTASONE) 20 MG tablet; Take 3 tablets for 3 days, take 2 tablets for 3 days, take 1  tablet for 3 days, take 1/2 tablet for 4 days. -     furosemide (LASIX) 20 MG tablet; Take 1 tablet (20 mg total) by mouth daily.  Irregular heart rhythm -     EKG 12-Lead  SOB (shortness of breath) -     Brain natriuretic peptide -     TSH -     COMPLETE METABOLIC PANEL WITH GFR -     CBC with Differential/Platelet -     DG Chest 2 View; Future  Essential hypertension  PVC's (premature ventricular contractions) -     EKG 12-Lead  Left leg swelling -     POCT UA - Microalbumin -     furosemide (LASIX) 20 MG tablet; Take 1 tablet (20 mg total) by mouth daily.  Irregular rhythm of heart.  EKG did show PAC and PVC all of which pt was known to have.  No atrial fibrillation.  No ST depression or elevation.  CXR normal. No xray signs of CHF or pneumonia.  UA micro negative for significant protein.  6 minute walk test with no significant desaturation.  Pulse ox 96 percent on room air.  Labs to look for infection, organ damage and fluid overload.  Pt is on ACE, BB.   Start prednisone taper and lasix. Follow up with PCP in 1 week or go to ED sooner if SOB or cough worsens.   Spent 40 minutes with patient reviewing chart, working up patient, discussing plans and medication.

## 2021-04-23 NOTE — Progress Notes (Signed)
No fluid accumulation on lungs. We get labs sending over a low dose diuretic and prednisone taper to start. Follow up with PcP for recheck in 1 week.

## 2021-04-24 ENCOUNTER — Ambulatory Visit: Payer: Managed Care, Other (non HMO) | Admitting: Family Medicine

## 2021-04-24 ENCOUNTER — Encounter: Payer: Self-pay | Admitting: Physician Assistant

## 2021-04-24 DIAGNOSIS — M7989 Other specified soft tissue disorders: Secondary | ICD-10-CM | POA: Insufficient documentation

## 2021-04-24 LAB — CBC WITH DIFFERENTIAL/PLATELET
Absolute Monocytes: 645 cells/uL (ref 200–950)
Basophils Absolute: 62 cells/uL (ref 0–200)
Basophils Relative: 0.5 %
Eosinophils Absolute: 149 cells/uL (ref 15–500)
Eosinophils Relative: 1.2 %
HCT: 43.3 % (ref 35.0–45.0)
Hemoglobin: 13.8 g/dL (ref 11.7–15.5)
Lymphs Abs: 4241 cells/uL — ABNORMAL HIGH (ref 850–3900)
MCH: 27 pg (ref 27.0–33.0)
MCHC: 31.9 g/dL — ABNORMAL LOW (ref 32.0–36.0)
MCV: 84.6 fL (ref 80.0–100.0)
MPV: 10.1 fL (ref 7.5–12.5)
Monocytes Relative: 5.2 %
Neutro Abs: 7304 cells/uL (ref 1500–7800)
Neutrophils Relative %: 58.9 %
Platelets: 284 10*3/uL (ref 140–400)
RBC: 5.12 10*6/uL — ABNORMAL HIGH (ref 3.80–5.10)
RDW: 13.3 % (ref 11.0–15.0)
Total Lymphocyte: 34.2 %
WBC: 12.4 10*3/uL — ABNORMAL HIGH (ref 3.8–10.8)

## 2021-04-24 LAB — COMPLETE METABOLIC PANEL WITH GFR
AG Ratio: 1.5 (calc) (ref 1.0–2.5)
ALT: 14 U/L (ref 6–29)
AST: 14 U/L (ref 10–35)
Albumin: 3.8 g/dL (ref 3.6–5.1)
Alkaline phosphatase (APISO): 97 U/L (ref 37–153)
BUN: 8 mg/dL (ref 7–25)
CO2: 29 mmol/L (ref 20–32)
Calcium: 9.4 mg/dL (ref 8.6–10.4)
Chloride: 105 mmol/L (ref 98–110)
Creat: 0.74 mg/dL (ref 0.50–1.05)
GFR, Est African American: 106 mL/min/{1.73_m2} (ref >=60)
GFR, Est Non African American: 91 mL/min/{1.73_m2} (ref >=60)
Globulin: 2.6 g/dL (calc) (ref 1.9–3.7)
Glucose, Bld: 234 mg/dL — ABNORMAL HIGH (ref 65–99)
Potassium: 4.1 mmol/L (ref 3.5–5.3)
Sodium: 141 mmol/L (ref 135–146)
Total Bilirubin: 0.3 mg/dL (ref 0.2–1.2)
Total Protein: 6.4 g/dL (ref 6.1–8.1)

## 2021-04-24 LAB — TSH: TSH: 1.61 mIU/L

## 2021-04-24 LAB — BRAIN NATRIURETIC PEPTIDE: Brain Natriuretic Peptide: 48 pg/mL (ref <100)

## 2021-04-24 NOTE — Progress Notes (Signed)
Terri Zimmerman,   Thyroid normal.  Kidney, liver, electrolytes normal.  WBC elevated but stable from previous checks.   How are you feeling this morning? Did you pick up both prescriptions last night prednisone and lasix?

## 2021-05-09 ENCOUNTER — Ambulatory Visit (INDEPENDENT_AMBULATORY_CARE_PROVIDER_SITE_OTHER): Payer: Managed Care, Other (non HMO) | Admitting: Physician Assistant

## 2021-05-09 ENCOUNTER — Encounter: Payer: Self-pay | Admitting: Physician Assistant

## 2021-05-09 VITALS — Ht 61.0 in | Wt 196.0 lb

## 2021-05-09 DIAGNOSIS — R9431 Abnormal electrocardiogram [ECG] [EKG]: Secondary | ICD-10-CM | POA: Diagnosis not present

## 2021-05-09 DIAGNOSIS — D72820 Lymphocytosis (symptomatic): Secondary | ICD-10-CM | POA: Insufficient documentation

## 2021-05-09 DIAGNOSIS — R058 Other specified cough: Secondary | ICD-10-CM | POA: Insufficient documentation

## 2021-05-09 DIAGNOSIS — D72829 Elevated white blood cell count, unspecified: Secondary | ICD-10-CM

## 2021-05-09 MED ORDER — PROMETHAZINE-DM 6.25-15 MG/5ML PO SYRP: 5.0000 mL | ORAL_SOLUTION | Freq: Four times a day (QID) | ORAL | 0 refills | Status: DC | PRN

## 2021-05-09 MED ORDER — BENZONATATE 200 MG PO CAPS: 200.0000 mg | ORAL_CAPSULE | Freq: Three times a day (TID) | ORAL | 0 refills | Status: DC | PRN

## 2021-05-09 MED ORDER — DOXYCYCLINE HYCLATE 100 MG PO TABS: 100.0000 mg | ORAL_TABLET | Freq: Two times a day (BID) | ORAL | 0 refills | Status: DC

## 2021-05-09 MED ORDER — AZITHROMYCIN 250 MG PO TABS: ORAL_TABLET | ORAL | 0 refills | Status: DC

## 2021-05-09 NOTE — Progress Notes (Signed)
Telemedicine Visit via  Video & Audio (App used: [employer]) Audio only - telephone (patient preference /  technical difficulty with MyChart video application)  I connected with [name] on 05/09/21 at 3:32 PM  by phone or  telemedicine application as noted above  I verified that I am speaking with or regarding  the correct patient using two identifiers.  Participants: Myself, Nelson Chimes PA-C Patient: Terri Zimmerman   Patient is at home in [facility] I am in office at [facility]    I discussed the limitations of evaluation and management  by telemedicine and the availability of in person appointments.  The participant(s) above expressed understanding and  agreed to proceed with this appointment via telemedicine.       History of Present Illness: Terri Zimmerman is a 55 y.o. female who would like to discuss recurrent cough   Reports "extreme, violent" coughing attacks to the point of straining her back muscles Cough is nonproductive and she feels a sensation in her throat that triggers cough randomly Interferes with activity - had to leave work It is not relieved by OTC cough suppressants like Robitussin She states Prednisone was helpful in the past after a few days, but she denies any SOB,DOE or wheezing She has been taking Lasix 20 mg daily for left leg edema prescribed several weeks ago and it has been helpful for swelling Denies associated fever, fatigue, malaise, nightsweats Smokes cigarettes 0.5 ppd    She was evaluated by PA Breeback on 04/23/21  EKG demonstrated PAC and PVC all of which pt was known to have.No atrial fibrillation.No ST depression or elevation. Qtc was borderline prolonged at 459 CXR negative UA micro negative for significant protein. 6 minute walk test with no significant desaturation. Pulse ox 96 percent on room air. Labs showed mild leukocytosis of 12.4 (chronically elevated up to 13.7 since 2020), mild lymphocyotisis (4241) CMP  showed hyperglycemia at 234 (known type 2 DM) TSH and BNP were unremarkable Treated with Prednisone taper and Lasix She is still taking Lasix 20 mg once daily     Observations/Objective: Ht 5\' 1"  (1.549 m)   Wt 196 lb (88.9 kg)   BMI 37.03 kg/m  BP Readings from Last 3 Encounters:  04/23/21 (!) 152/81  04/15/21 140/80  04/04/21 (!) 135/94   Exam limited by telehealth - no vital signs provided by patient Gen: alert, well appearing, no distress Pulm: speaking in full sentences, normal phonation Psych: cooperative, normal thought content, normal behavior  Lab and Radiology Results No results found for this or any previous visit (from the past 72 hour(s)). No results found.  Lab Results  Component Value Date   CREATININE 0.74 04/23/2021   BUN 8 04/23/2021   NA 141 04/23/2021   K 4.1 04/23/2021   CL 105 04/23/2021   CO2 29 04/23/2021   Lab Results  Component Value Date   ALT 14 04/23/2021   AST 14 04/23/2021   ALKPHOS 101 06/10/2019   BILITOT 0.3 04/23/2021   Lab Results  Component Value Date   WBC 12.4 (H) 04/23/2021   HGB 13.8 04/23/2021   HCT 43.3 04/23/2021   MCV 84.6 04/23/2021   PLT 284 04/23/2021   Lab Results  Component Value Date   TSH 1.61 04/23/2021    CLINICAL DATA:  Cough for 2 months. Lower extremity edema. Current smoker.   EXAM: CHEST - 2 VIEW   COMPARISON:  04/03/2019   FINDINGS: Midline trachea. Normal heart size and mediastinal  contours. No pleural effusion or pneumothorax. Clear lungs.   IMPRESSION: No acute cardiopulmonary disease.     Electronically Signed   By: Abigail Miyamoto M.D.   On: 04/23/2021 14:45  EKG 04/23/21 13:22 Vent rate 100 bpm PR-I 150 ms QRS 90 ms QT/Qtc 356/459 Sinus rhythm with PVS and fusion complexes  Assessment and Plan: 55 y.o. female with The primary encounter diagnosis was Recurrent nonproductive cough. Diagnoses of Leukocytosis, unspecified type and Abnormal QT interval present on  electrocardiogram were also pertinent to this visit.  Personally reviewed CXR, EKG and labs dated 04/23/21 Will avoid Macrolide due to borderline Qtc Treating empirically for bacterial lower resp infection with Doxycycline bid x 7 days Discussed the WBC abnormality with patient - this has been present x 2 years with no work-up  - recommend repeating CBC/d and peripheral smear in 2 weeks since current illness and recent steroid use Counseled on ER precautions Follow-up in 2 weeks  PDMP reviewed during this encounter. Orders Placed This Encounter  Procedures   CBC with Differential/Platelet   Pathologist smear review   Meds ordered this encounter  Medications   DISCONTD: azithromycin (ZITHROMAX) 250 MG tablet    Sig: Take 2 tablets on day 1, then 1 tablet daily on days 2 through 5    Dispense:  6 tablet    Refill:  0    Order Specific Question:   Supervising Provider    Answer:   Emeterio Reeve [[id number]]   promethazine-dextromethorphan (PROMETHAZINE-DM) 6.25-15 MG/5ML syrup    Sig: Take 5 mLs by mouth every 6 (six) hours as needed for cough.    Dispense:  180 mL    Refill:  0    Order Specific Question:   Supervising Provider    Answer:   Emeterio Reeve [[id number]]   benzonatate (TESSALON) 200 MG capsule    Sig: Take 1 capsule (200 mg total) by mouth 3 (three) times daily as needed for cough.    Dispense:  20 capsule    Refill:  0    Order Specific Question:   Supervising Provider    Answer:   Emeterio Reeve [[id number]]   doxycycline (VIBRA-TABS) 100 MG tablet    Sig: Take 1 tablet (100 mg total) by mouth 2 (two) times daily for 7 days.    Dispense:  14 tablet    Refill:  0    Order Specific Question:   Supervising Provider    Answer:   Emeterio Reeve [[id number]]   Patient Instructions  Take antibiotic Doxycycline 100 mg twice a day x 7 days to cover for bacterial lower respiratory infection (I originally was going to prescribe a Zpak but your EKG showed a  borderline abnormality in the QT interval and Zpaks can worsen this, so we will avoid it) Repeat lab work - CBC and peripheral blood smear in 2 weeks   Instructions sent via [employer].   Follow Up Instructions: No follow-ups on file.    I discussed the assessment and treatment plan with the patient. The patient was provided an opportunity to ask questions and all were answered. The patient agreed with the plan and demonstrated an understanding of the instructions.   The patient was advised to call back or seek an in-person evaluation if any new concerns, if symptoms worsen or if the condition fails to improve as anticipated.  29 minutes of non-face-to-face time was provided during this encounter.      . . . . . . . . . . . . . Marland Kitchen  Historical information moved to improve visibility of documentation.  Past Medical History:  Diagnosis Date   Asthma 04-25-07   Diabetes mellitus 11-12-06   type 2   Hypertension 11-04-06   Past Surgical History:  Procedure Laterality Date   ABDOMINAL HYSTERECTOMY  12-11   Complete   APPENDECTOMY  9-00   BREAST BIOPSY Left    BREAST CYST EXCISION Left    BREAST SURGERY  4-00   of cyst   FOOT TENDON SURGERY  05/16/2012   bone spur.     LAPAROSCOPY  4-08   for cyst and endometriosis   left breast bx  12-11   Social History   Tobacco Use   Smoking status: Every Day    Packs/day: 0.50    Pack years: 0.00    Types: Cigarettes   Smokeless tobacco: Never  Substance Use Topics   Alcohol use: No   family history includes Diabetes in her father and another family member; Heart disease in an other family member; Other in her mother.  Medications: Current Outpatient Medications  Medication Sig Dispense Refill   acetaminophen (TYLENOL) 325 MG tablet Take 650 mg by mouth 2 (two) times daily.     AMBULATORY NON FORMULARY MEDICATION 1 each by Other route 2 (two) times daily. Lancets for daily glucose checks  Diagnosis: E11.9 Testing 2 times a day 200 each 5   benzonatate (TESSALON) 200 MG capsule Take 1 capsule (200 mg total) by mouth 3 (three) times daily as needed for cough. 20 capsule 0   Continuous Blood Gluc Sensor (DEXCOM G6 SENSOR) MISC 1 Device by Does not apply route as directed. 9 each 3   Continuous Blood Gluc Transmit (DEXCOM G6 TRANSMITTER) MISC 1 Device by Does not apply route as directed. 1 each 3   doxycycline (VIBRA-TABS) 100 MG tablet Take 1 tablet (100 mg total) by mouth 2 (two) times daily for 7 days. 14 tablet 0   furosemide (LASIX) 20 MG tablet Take 1 tablet (20 mg total) by mouth daily. 30 tablet 0   glucose blood (ONE TOUCH ULTRA TEST) test strip USE TO TEST BLOOD SUGARS 2 TIMES A DAY. DX:E11.8 200 each 11   Insulin Glargine (BASAGLAR KWIKPEN) 100 UNIT/ML INJECT 80 UNITS INTO THE SKIN TWICE DAILY. DO NOT MIX WITH OTHER INSULINS 135 mL 1   insulin lispro (HUMALOG KWIKPEN) 100 UNIT/ML KwikPen Inject 5 Units into the skin daily with breakfast AND 10 Units daily with lunch AND 12 Units daily with supper. 30 mL 6   Insulin Pen Needle (B-D UF III MINI PEN NEEDLES) 31G X 5 MM MISC USE TO INJECT INSULIN UP TO THREE (3) TIMES DAILY. DIAGNOSIS DM E11.9 1 each 33   lisinopril (ZESTRIL) 20 MG tablet TAKE 1 TABLET BY MOUTH EVERY DAY 90 tablet 1   metFORMIN (GLUCOPHAGE-XR) 500 MG 24 hr tablet Take 2 tablets (1,000 mg total) by mouth daily with breakfast AND 1 tablet (500 mg total) daily with supper. 270 tablet 1   metoprolol succinate (TOPROL-XL) 25 MG 24 hr tablet TAKE 1 TABLET BY MOUTH EVERYDAY AT BEDTIME 90 tablet 1   OneTouch Delica Lancets 81O MISC UAD AS DIRECTED     promethazine-dextromethorphan (PROMETHAZINE-DM) 6.25-15 MG/5ML syrup Take 5 mLs by mouth every 6 (six) hours as needed for cough. 180 mL 0   No current facility-administered medications for this visit.   Allergies  Allergen Reactions   Trulicity [Dulaglutide] Nausea Only    Abdominal pain,   Jentadueto  [Linagliptin-Metformin Hcl  Er]     Abdominal pains and diarrhea   Lipitor [Atorvastatin]     Sudden hair loss 2 weeks after starting lipitor.    Onglyza [Saxagliptin] Other (See Comments)    Pancreatitis   Ozempic (0.25 Or 0.5 Mg-Dose) [Semaglutide(0.25 Or 0.5mg -Dos)] Diarrhea and Other (See Comments)    Abdominal pain   Victoza [Liraglutide] Other (See Comments)    Abdominal pain and nausea.      If phone visit, billing and coding can please add appropriate modifier if needed

## 2021-05-09 NOTE — Progress Notes (Signed)
Chronic cough, restarted yesterday No other symptoms Neg Covid test OTC meds not helping Saw Verlena Marlette 04/23/2021 for same issue - given Prednisone and Lasix

## 2021-05-09 NOTE — Patient Instructions (Signed)
Take antibiotic Doxycycline 100 mg twice a day x 7 days to cover for bacterial lower respiratory infection (I originally was going to prescribe a Zpak but your EKG showed a borderline abnormality in the QT interval and Zpaks can worsen this, so we will avoid it) Repeat lab work - CBC and peripheral blood smear in 2 weeks

## 2021-05-15 ENCOUNTER — Other Ambulatory Visit: Payer: Self-pay | Admitting: Physician Assistant

## 2021-05-15 DIAGNOSIS — R053 Chronic cough: Secondary | ICD-10-CM

## 2021-05-15 DIAGNOSIS — M7989 Other specified soft tissue disorders: Secondary | ICD-10-CM

## 2021-05-16 ENCOUNTER — Ambulatory Visit (INDEPENDENT_AMBULATORY_CARE_PROVIDER_SITE_OTHER): Payer: Managed Care, Other (non HMO) | Admitting: Physician Assistant

## 2021-05-16 ENCOUNTER — Other Ambulatory Visit: Payer: Self-pay

## 2021-05-16 ENCOUNTER — Encounter: Payer: Self-pay | Admitting: Physician Assistant

## 2021-05-16 VITALS — BP 150/77 | HR 110 | Temp 98.6°F | Ht 61.0 in | Wt 193.0 lb

## 2021-05-16 DIAGNOSIS — U099 Post covid-19 condition, unspecified: Secondary | ICD-10-CM

## 2021-05-16 DIAGNOSIS — F17200 Nicotine dependence, unspecified, uncomplicated: Secondary | ICD-10-CM | POA: Insufficient documentation

## 2021-05-16 DIAGNOSIS — R0789 Other chest pain: Secondary | ICD-10-CM | POA: Insufficient documentation

## 2021-05-16 DIAGNOSIS — Z6836 Body mass index (BMI) 36.0-36.9, adult: Secondary | ICD-10-CM

## 2021-05-16 DIAGNOSIS — D72829 Elevated white blood cell count, unspecified: Secondary | ICD-10-CM

## 2021-05-16 DIAGNOSIS — R059 Cough, unspecified: Secondary | ICD-10-CM

## 2021-05-16 DIAGNOSIS — R058 Other specified cough: Secondary | ICD-10-CM

## 2021-05-16 DIAGNOSIS — R0602 Shortness of breath: Secondary | ICD-10-CM

## 2021-05-16 DIAGNOSIS — R1012 Left upper quadrant pain: Secondary | ICD-10-CM | POA: Diagnosis not present

## 2021-05-16 DIAGNOSIS — Z8616 Personal history of COVID-19: Secondary | ICD-10-CM | POA: Diagnosis not present

## 2021-05-16 DIAGNOSIS — E6609 Other obesity due to excess calories: Secondary | ICD-10-CM

## 2021-05-16 DIAGNOSIS — R053 Post covid-19 condition, unspecified: Secondary | ICD-10-CM

## 2021-05-16 MED ORDER — PREDNISONE 20 MG PO TABS: ORAL_TABLET | ORAL | 0 refills | Status: DC

## 2021-05-16 MED ORDER — ALBUTEROL SULFATE HFA 108 (90 BASE) MCG/ACT IN AERS: 2.0000 | INHALATION_SPRAY | Freq: Four times a day (QID) | RESPIRATORY_TRACT | 0 refills | Status: DC | PRN

## 2021-05-16 NOTE — Patient Instructions (Signed)
Will call pulmonology.  CT of chest.  Start prednisone taper.

## 2021-05-16 NOTE — Progress Notes (Signed)
Subjective:    Patient ID: Terri Zimmerman, female    DOB: 1966-08-12, 55 y.o.   MRN: [id number]  HPI Pt is a 55 yo obese female with chronic cough since covid September 2021. She has been seen at least 55 times for this. She has had some months where she feels a little better but then gets worse again. Most of the time cough is dry but at times it has been productive. She has been treated with last least 3 antibiotics the last being doxycycline. She felt the best after taking prednisone. She is concerned about left upper quadrant pain as well too. She feels "full" in the abdomen by the end of day. Hx of elevated lipase levels and pancreatitis. She is progressively getting SOB. Normal Echo. Normal CXR 6/8. No calf pain, tenderness, or swelling.  She has elevated WBC.  She has been taking lasix and helps her lower extremity swelling but not her SOB or cough.  She smokes 8-10 cigs a day. She does not drink alcohol.    .. Active Ambulatory Problems    Diagnosis Date Noted   Diabetes mellitus type 2 with complications ([place]) 02/77/4128   VITAMIN D DEFICIENCY 01/10/2008   Hereditary and idiopathic peripheral neuropathy 10/22/2009   Essential hypertension 11/29/2008   ALLERGIC RHINITIS 01/10/2008   ASTHMA, UNSPECIFIED, UNSPECIFIED STATUS 01/10/2008   AMENORRHEA 04/17/2010   PALPITATIONS, OCCASIONAL 04/12/2008   Tobacco abuse 05/25/2014   Patchy loss of hair 03/02/2015   De Quervain's tenosynovitis, left 02/20/2016   NAFL (nonalcoholic fatty liver) 78/67/6720   OSA (obstructive sleep apnea) 09/02/2018   Hematuria 04/18/2019   Primary osteoarthritis of right hip 04/18/2019   PVC's (premature ventricular contractions) 04/25/2019   Low back pain radiating to both legs 10/27/2019   Class 2 obesity due to excess calories without serious comorbidity with body mass index (BMI) of 36.0 to 36.9 in adult 01/22/2020   Stress at home 10/28/2020   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   Type 2 diabetes  mellitus with hyperglycemia, with long-term current use of insulin ([place]) 12/31/2020   Elevated cortisol level 01/13/2021   Trigger finger of left thumb and right ring finger 03/19/2021   Right foot pain 03/19/2021   Polyarthralgia 04/15/2021   Adrenal adenoma, right 04/15/2021   Irregular heart rhythm 04/23/2021   Cough, persistent 04/23/2021   SOB (shortness of breath) 04/23/2021   Left leg swelling 04/24/2021   Recurrent non-productive cough 05/09/2021   Leukocytosis 05/09/2021   Abnormal QT interval present on electrocardiogram 05/09/2021   Left upper quadrant pain 05/16/2021   History of COVID-19 05/16/2021   Tobacco dependence 05/16/2021   Chest tightness 05/16/2021   Resolved Ambulatory Problems    Diagnosis Date Noted   KNEE PAIN, ACUTE 08/19/2010   Elevated BP 11/14/2015   Rash 07/02/2020   Past Medical History:  Diagnosis Date   Asthma 04-25-07   Diabetes mellitus 11-12-06   Hypertension 11-04-06     Review of Systems See HPI.     Objective:   Physical Exam Vitals reviewed.  Constitutional:      Appearance: Normal appearance. She is obese.  HENT:     Head: Normocephalic.  Cardiovascular:     Rate and Rhythm: Tachycardia present.     Pulses: Normal pulses.  Pulmonary:     Effort: No respiratory distress.     Breath sounds: No wheezing or rhonchi.  Abdominal:     General: Bowel sounds are normal. There is no distension.  Palpations: Abdomen is soft. There is no mass.     Tenderness: There is abdominal tenderness. There is no right CVA tenderness, left CVA tenderness, guarding or rebound.     Comments: Tenderness over LUQ to palpation.   Musculoskeletal:     Right lower leg: No edema.     Left lower leg: No edema.  Lymphadenopathy:     Cervical: No cervical adenopathy.  Neurological:     General: No focal deficit present.     Mental Status: She is alert and oriented to person, place, and time.  Psychiatric:        Mood and Affect: Mood normal.           Assessment & Plan:  Marland KitchenMarland KitchenElizabethann was seen today for cough.  Diagnoses and all orders for this visit:  Post-COVID chronic cough -     albuterol (VENTOLIN HFA) 108 (90 Base) MCG/ACT inhaler; Inhale 2 puffs into the lungs every 6 (six) hours as needed. -     Ambulatory referral to Pulmonology  Left upper quadrant pain -     Lipase -     BASIC METABOLIC PANEL WITH GFR  Recurrent non-productive cough -     BASIC METABOLIC PANEL WITH GFR -     albuterol (VENTOLIN HFA) 108 (90 Base) MCG/ACT inhaler; Inhale 2 puffs into the lungs every 6 (six) hours as needed. -     predniSONE (DELTASONE) 20 MG tablet; Take 3 tablets for 3 days, take 2 tablets for 3 days, take 1 tablet for 3 days, take 1 tablet for 3 days, take 1/2 tablet for 4 days. -     Ambulatory referral to Pulmonology  History of COVID-19 -     CBC w/Diff/Platelet -     Pathologist smear review -     BASIC METABOLIC PANEL WITH GFR -     Ambulatory referral to Pulmonology  Leukocytosis, unspecified type -     CBC w/Diff/Platelet -     Pathologist smear review -     BASIC METABOLIC PANEL WITH GFR -     Ambulatory referral to Pulmonology  SOB (shortness of breath) -     albuterol (VENTOLIN HFA) 108 (90 Base) MCG/ACT inhaler; Inhale 2 puffs into the lungs every 6 (six) hours as needed. -     predniSONE (DELTASONE) 20 MG tablet; Take 3 tablets for 3 days, take 2 tablets for 3 days, take 1 tablet for 3 days, take 1 tablet for 3 days, take 1/2 tablet for 4 days. -     Ambulatory referral to Pulmonology  Chest tightness -     albuterol (VENTOLIN HFA) 108 (90 Base) MCG/ACT inhaler; Inhale 2 puffs into the lungs every 6 (six) hours as needed. -     predniSONE (DELTASONE) 20 MG tablet; Take 3 tablets for 3 days, take 2 tablets for 3 days, take 1 tablet for 3 days, take 1 tablet for 3 days, take 1/2 tablet for 4 days. -     Ambulatory referral to Pulmonology  Cough -     albuterol (VENTOLIN HFA) 108 (90 Base) MCG/ACT inhaler;  Inhale 2 puffs into the lungs every 6 (six) hours as needed. -     CT Chest Wo Contrast; Future  Tobacco dependence  Class 2 obesity due to excess calories without serious comorbidity with body mass index (BMI) of 36.0 to 36.9 in adult  Lungs sound pretty clear. Pulse ox is 97 percent. No significant edema and BNP normal. Ok to use  lasix as needed only when lower ext edema present.  Will get chest CT.  Pulmonology referral made. I suspect sequela of covid.  Bmp and CBC recheck.  Lipase ordered to look follow up on hx of pancreatitis.  I suspect left lower quadrant pain could be related to her violent coughing.  Prednisone and albuterol given today.  Perhaps covid induced some COPD pt is a smoker.    Reminded patient of the importance of smoking cessation with lung health.

## 2021-05-20 LAB — CBC WITH DIFFERENTIAL/PLATELET
Absolute Monocytes: 367 cells/uL (ref 200–950)
Basophils Absolute: 51 cells/uL (ref 0–200)
Basophils Relative: 0.5 %
Eosinophils Absolute: 194 cells/uL (ref 15–500)
Eosinophils Relative: 1.9 %
HCT: 45.6 % — ABNORMAL HIGH (ref 35.0–45.0)
Hemoglobin: 14.5 g/dL (ref 11.7–15.5)
Lymphs Abs: 3825 cells/uL (ref 850–3900)
MCH: 27.2 pg (ref 27.0–33.0)
MCHC: 31.8 g/dL — ABNORMAL LOW (ref 32.0–36.0)
MCV: 85.6 fL (ref 80.0–100.0)
MPV: 9.7 fL (ref 7.5–12.5)
Monocytes Relative: 3.6 %
Neutro Abs: 5763 cells/uL (ref 1500–7800)
Neutrophils Relative %: 56.5 %
Platelets: 255 10*3/uL (ref 140–400)
RBC: 5.33 10*6/uL — ABNORMAL HIGH (ref 3.80–5.10)
RDW: 13.3 % (ref 11.0–15.0)
Total Lymphocyte: 37.5 %
WBC: 10.2 10*3/uL (ref 3.8–10.8)

## 2021-05-20 LAB — BASIC METABOLIC PANEL WITH GFR
BUN: 8 mg/dL (ref 7–25)
CO2: 30 mmol/L (ref 20–32)
Calcium: 9.1 mg/dL (ref 8.6–10.4)
Chloride: 103 mmol/L (ref 98–110)
Creat: 0.62 mg/dL (ref 0.50–1.05)
GFR, Est African American: 118 mL/min/{1.73_m2} (ref >=60)
GFR, Est Non African American: 102 mL/min/{1.73_m2} (ref >=60)
Glucose, Bld: 262 mg/dL — ABNORMAL HIGH (ref 65–99)
Potassium: 4.2 mmol/L (ref 3.5–5.3)
Sodium: 141 mmol/L (ref 135–146)

## 2021-05-20 LAB — PATHOLOGIST SMEAR REVIEW

## 2021-05-20 LAB — LIPASE: Lipase: 26 U/L (ref 7–60)

## 2021-05-20 NOTE — Progress Notes (Signed)
Sugars up as they have been.  No kidney, electrolyte, liver abnormality.  Lipase is normal. No sign of pancreatitis.  Normal WBC better than 3 weeks ago.

## 2021-05-20 NOTE — Progress Notes (Signed)
Peripheral blood smear normal. Do you have appt with pulmonology yet? How do you feel today?

## 2021-05-22 ENCOUNTER — Telehealth: Payer: Self-pay | Admitting: Neurology

## 2021-05-22 NOTE — Telephone Encounter (Signed)
Patient left vm.  Has not heard about CT scan or Pulmonary referral.  Looks like auth submitted for CT.  Can we follow up with patient on referrals?

## 2021-05-23 NOTE — Telephone Encounter (Signed)
Pulmonary appointment has been scheduled and Amber is working on CT imaging will schedule when it is authorized. - CF

## 2021-05-24 ENCOUNTER — Ambulatory Visit (INDEPENDENT_AMBULATORY_CARE_PROVIDER_SITE_OTHER): Payer: Managed Care, Other (non HMO)

## 2021-05-24 ENCOUNTER — Other Ambulatory Visit: Payer: Self-pay

## 2021-05-24 DIAGNOSIS — R0989 Other specified symptoms and signs involving the circulatory and respiratory systems: Secondary | ICD-10-CM

## 2021-05-24 DIAGNOSIS — R059 Cough, unspecified: Secondary | ICD-10-CM | POA: Diagnosis not present

## 2021-05-24 DIAGNOSIS — Z8616 Personal history of COVID-19: Secondary | ICD-10-CM

## 2021-05-26 ENCOUNTER — Encounter: Payer: Self-pay | Admitting: Physician Assistant

## 2021-05-26 ENCOUNTER — Encounter: Payer: Self-pay | Admitting: Family Medicine

## 2021-05-26 ENCOUNTER — Telehealth (INDEPENDENT_AMBULATORY_CARE_PROVIDER_SITE_OTHER): Payer: Managed Care, Other (non HMO) | Admitting: Family Medicine

## 2021-05-26 VITALS — BP 137/80 | HR 90

## 2021-05-26 DIAGNOSIS — R053 Chronic cough: Secondary | ICD-10-CM | POA: Diagnosis not present

## 2021-05-26 DIAGNOSIS — S20211A Contusion of right front wall of thorax, initial encounter: Secondary | ICD-10-CM

## 2021-05-26 DIAGNOSIS — S99921A Unspecified injury of right foot, initial encounter: Secondary | ICD-10-CM | POA: Diagnosis not present

## 2021-05-26 DIAGNOSIS — U099 Post covid-19 condition, unspecified: Secondary | ICD-10-CM

## 2021-05-26 DIAGNOSIS — I700 Atherosclerosis of aorta: Secondary | ICD-10-CM | POA: Insufficient documentation

## 2021-05-26 MED ORDER — VALSARTAN 160 MG PO TABS: 160.0000 mg | ORAL_TABLET | Freq: Every day | ORAL | 3 refills | Status: DC

## 2021-05-26 MED ORDER — CYCLOBENZAPRINE HCL 10 MG PO TABS: 10.0000 mg | ORAL_TABLET | Freq: Every day | ORAL | 0 refills | Status: DC

## 2021-05-26 NOTE — Progress Notes (Signed)
Virtual Visit via Video Note  I connected with Terri Zimmerman on 05/26/21 at 11:30 AM EDT by a video enabled telemedicine application and verified that I am speaking with the correct person using two identifiers.   I discussed the limitations of evaluation and management by telemedicine and the availability of in person appointments. The patient expressed understanding and agreed to proceed.  Patient location: at home Provider location: in office  Subjective:    CC: fall on Friday night.   HPI:  Pt reports that Friday night she fell and broke her R little toe and she landed injuring her ribs.she has been alternating between heat and cold tylenol and motrin. She felt a pop as her foot caught the edge of the chaise and fell forward.    She continues to have a cough which causes more pain with her rib. She has had a cough since having COVID in Sept, so had a CT of chest done on the 9th. She is a smoker.   She is read her CT results and would like to discuss this w/pcp    Past medical history, Surgical history, Family history not pertinant except as noted below, Social history, Allergies, and medications have been entered into the medical record, reviewed, and corrections made.    Objective:    General: Speaking clearly in complete sentences without any shortness of breath.  Alert and oriented x3.  Normal judgment. No apparent acute distress.    Impression and Recommendations:    No problem-specific Assessment & Plan notes found for this encounter.  Contusion right ribs - discussed heat and ice prn.  Katie increase Motrin to 800 mg 3 times a day for the next couple of days and then taper downward since she does get a little bit of relief with the Motrin.  We will try to avoid narcotics if at all possible.  Also added a dose of Flexeril at bedtime to help with sleep.  Right 5th toe injury - discussed possible fracture.  Consider getting x-ray.  She says for now she just wants to keep an  eye on it.  Recommend continued ice.  Chronic cough - has appt in August with pulm  in about 4 weeks.  Triggered after having COVID. CT results reassuring. Discussed changing her ACE to an ARB to see if helps with the cough.    No orders of the defined types were placed in this encounter.   Meds ordered this encounter  Medications   valsartan (DIOVAN) 160 MG tablet    Sig: Take 1 tablet (160 mg total) by mouth daily.    Dispense:  90 tablet    Refill:  3   cyclobenzaprine (FLEXERIL) 10 MG tablet    Sig: Take 1 tablet (10 mg total) by mouth at bedtime.    Dispense:  30 tablet    Refill:  0     I discussed the assessment and treatment plan with the patient. The patient was provided an opportunity to ask questions and all were answered. The patient agreed with the plan and demonstrated an understanding of the instructions.   The patient was advised to call back or seek an in-person evaluation if the symptoms worsen or if the condition fails to improve as anticipated.   Beatrice Lecher, MD

## 2021-05-26 NOTE — Progress Notes (Signed)
Pt reports that Friday night she fell and broke her R little toe and she landed injuring her ribs.she has been alternating between heat and cold tylenol and motrin.   She continues to have a cough which causes more pain with her rib.  She is read her CT results and would like to discuss this w/pcp

## 2021-05-26 NOTE — Progress Notes (Signed)
Terri Zimmerman,   Stable adrenal adenoma.   There is coronary and aortic plaque seen. You really should be on cholesterol lowering drug to prevent future CV event. What are your thoughts?   Left lower lung scarring but no acute findings.

## 2021-05-30 ENCOUNTER — Telehealth: Payer: Self-pay | Admitting: *Deleted

## 2021-05-30 MED ORDER — TRAMADOL HCL 50 MG PO TABS: 50.0000 mg | ORAL_TABLET | Freq: Three times a day (TID) | ORAL | 0 refills | Status: AC | PRN

## 2021-05-30 NOTE — Telephone Encounter (Signed)
     Meds ordered this encounter  Medications   traMADol (ULTRAM) 50 MG tablet    Sig: Take 1 tablet (50 mg total) by mouth every 8 (eight) hours as needed for up to 5 days.    Dispense:  15 tablet    Refill:  0

## 2021-05-30 NOTE — Telephone Encounter (Signed)
Pt called and asked if she can get something else for pain. She stated that the flexeril and mobic is not effective.   She doesn't want any opiates or codeine

## 2021-06-07 ENCOUNTER — Other Ambulatory Visit: Payer: Self-pay | Admitting: Physician Assistant

## 2021-06-07 DIAGNOSIS — R053 Post covid-19 condition, unspecified: Secondary | ICD-10-CM

## 2021-06-07 DIAGNOSIS — R059 Cough, unspecified: Secondary | ICD-10-CM

## 2021-06-07 DIAGNOSIS — R058 Other specified cough: Secondary | ICD-10-CM

## 2021-06-07 DIAGNOSIS — R0789 Other chest pain: Secondary | ICD-10-CM

## 2021-06-07 DIAGNOSIS — R0602 Shortness of breath: Secondary | ICD-10-CM

## 2021-06-07 DIAGNOSIS — U099 Post covid-19 condition, unspecified: Secondary | ICD-10-CM

## 2021-06-25 ENCOUNTER — Encounter: Payer: Self-pay | Admitting: Pulmonary Disease

## 2021-06-25 ENCOUNTER — Other Ambulatory Visit: Payer: Self-pay

## 2021-06-25 ENCOUNTER — Ambulatory Visit (INDEPENDENT_AMBULATORY_CARE_PROVIDER_SITE_OTHER): Payer: Managed Care, Other (non HMO) | Admitting: Pulmonary Disease

## 2021-06-25 VITALS — BP 128/94 | HR 104 | Temp 98.5°F | Ht 61.0 in | Wt 193.2 lb

## 2021-06-25 DIAGNOSIS — F17200 Nicotine dependence, unspecified, uncomplicated: Secondary | ICD-10-CM

## 2021-06-25 DIAGNOSIS — R059 Cough, unspecified: Secondary | ICD-10-CM

## 2021-06-25 MED ORDER — MOMETASONE FURO-FORMOTEROL FUM 200-5 MCG/ACT IN AERO: 2.0000 | INHALATION_SPRAY | Freq: Two times a day (BID) | RESPIRATORY_TRACT | 0 refills | Status: DC

## 2021-06-25 NOTE — Progress Notes (Signed)
Synopsis: Referred in August 2022 for cough/hx of covid 19 by Iran Planas, PA  Subjective:   PATIENT ID: Terri Zimmerman GENDER: female DOB: May 25, 1966, MRN: [id number]   HPI  Chief Complaint  Patient presents with   Consult    Referred by PCP for history of COVID (Sept 2021) and cough. States the cough has not been productive, comes from the middle of her throat.     Terri Zimmerman is a 55 year old woman, daily smoker with history of diabetes mellitus II, hypertension and asthma who is referred to pulmonary clinic for cough and history of covid 19 infection.   She reports having a cough for over 8 months that is mainly dry.  She denies hemoptysis.  She has had issues with reactive airways disease and bouts of bronchitis in the past that have usually responded to albuterol but she is not finding relief at this time.  She feels the cough is coming from her throat.  The cough can keep her up at night sometimes.  She does have some runny nose but denies postnasal drainage.  She does have intermittent GERD symptoms.  She is smoking half a pack per day.  She started smoking at age 33 and smoked 1 pack/day for about a 10-year period.  Overall she has a 25+ pack year smoking history.  She was recently transition from lisinopril to an ARB by her primary care physician for the cough.  Past Medical History:  Diagnosis Date   Asthma 04-25-07   Diabetes mellitus 11-12-06   type 2   Hypertension 11-04-06     Family History  Problem Relation Age of Onset   Other Mother        RA and CHF   Diabetes Father    Diabetes Other    Heart disease Other      Social History   Socioeconomic History   Marital status: Married    Spouse name: Not on file   Number of children: Not on file   Years of education: Not on file   Highest education level: Not on file  Occupational History   Not on file  Tobacco Use   Smoking status: Every Day    Packs/day: 0.50    Types: Cigarettes   Smokeless tobacco:  Never  Vaping Use   Vaping Use: Never used  Substance and Sexual Activity   Alcohol use: No   Drug use: No   Sexual activity: Not on file  Other Topics Concern   Not on file  Social History Narrative   Not on file   Social Determinants of Health   Financial Resource Strain: Not on file  Food Insecurity: Not on file  Transportation Needs: Not on file  Physical Activity: Not on file  Stress: Not on file  Social Connections: Not on file  Intimate Partner Violence: Not on file     Allergies  Allergen Reactions   Trulicity [Dulaglutide] Nausea Only    Abdominal pain,   Jentadueto [Linagliptin-Metformin Hcl Er]     Abdominal pains and diarrhea   Lipitor [Atorvastatin]     Sudden hair loss 2 weeks after starting lipitor.    Onglyza [Saxagliptin] Other (See Comments)    Pancreatitis   Ozempic (0.25 Or 0.5 Mg-Dose) [Semaglutide(0.25 Or 0.'5mg'$ -Dos)] Diarrhea and Other (See Comments)    Abdominal pain   Victoza [Liraglutide] Other (See Comments)    Abdominal pain and nausea.      Outpatient Medications Prior to Visit  Medication Sig Dispense Refill   acetaminophen (TYLENOL) 325 MG tablet Take 650 mg by mouth 2 (two) times daily.     albuterol (VENTOLIN HFA) 108 (90 Base) MCG/ACT inhaler TAKE 2 PUFFS BY MOUTH EVERY 6 HOURS AS NEEDED 8.5 each 1   AMBULATORY NON FORMULARY MEDICATION 1 each by Other route 2 (two) times daily. Lancets for daily glucose checks Diagnosis: E11.9 Testing 2 times a day 200 each 5   glucose blood (ONE TOUCH ULTRA TEST) test strip USE TO TEST BLOOD SUGARS 2 TIMES A DAY. DX:E11.8 200 each 11   Insulin Glargine (BASAGLAR KWIKPEN) 100 UNIT/ML INJECT 80 UNITS INTO THE SKIN TWICE DAILY. DO NOT MIX WITH OTHER INSULINS 135 mL 1   insulin lispro (HUMALOG KWIKPEN) 100 UNIT/ML KwikPen Inject 5 Units into the skin daily with breakfast AND 10 Units daily with lunch AND 12 Units daily with supper. 30 mL 6   Insulin Pen Needle (B-D UF III MINI PEN NEEDLES) 31G X 5 MM MISC  USE TO INJECT INSULIN UP TO THREE (3) TIMES DAILY. DIAGNOSIS DM E11.9 1 each 33   metFORMIN (GLUCOPHAGE-XR) 500 MG 24 hr tablet Take 2 tablets (1,000 mg total) by mouth daily with breakfast AND 1 tablet (500 mg total) daily with supper. 270 tablet 1   metoprolol succinate (TOPROL-XL) 25 MG 24 hr tablet TAKE 1 TABLET BY MOUTH EVERYDAY AT BEDTIME 90 tablet 1   Multiple Vitamin (MULTIVITAMIN) tablet Take 1 tablet by mouth daily.     OneTouch Delica Lancets 99991111 MISC UAD AS DIRECTED     valsartan (DIOVAN) 160 MG tablet Take 1 tablet (160 mg total) by mouth daily. 90 tablet 3   benzonatate (TESSALON) 200 MG capsule Take 1 capsule (200 mg total) by mouth 3 (three) times daily as needed for cough. 20 capsule 0   Continuous Blood Gluc Sensor (DEXCOM G6 SENSOR) MISC 1 Device by Does not apply route as directed. 9 each 3   Continuous Blood Gluc Transmit (DEXCOM G6 TRANSMITTER) MISC 1 Device by Does not apply route as directed. 1 each 3   cyclobenzaprine (FLEXERIL) 10 MG tablet Take 1 tablet (10 mg total) by mouth at bedtime. 30 tablet 0   furosemide (LASIX) 20 MG tablet Take 1 tablet (20 mg total) by mouth daily. 30 tablet 0   predniSONE (DELTASONE) 20 MG tablet Take 3 tablets for 3 days, take 2 tablets for 3 days, take 1 tablet for 3 days, take 1 tablet for 3 days, take 1/2 tablet for 4 days. 20 tablet 0   promethazine-dextromethorphan (PROMETHAZINE-DM) 6.25-15 MG/5ML syrup Take 5 mLs by mouth every 6 (six) hours as needed for cough. 180 mL 0   No facility-administered medications prior to visit.    Review of Systems  Constitutional:  Negative for chills, fever, malaise/fatigue and weight loss.  HENT:  Negative for congestion, sinus pain and sore throat.   Eyes: Negative.   Respiratory:  Positive for cough and shortness of breath. Negative for hemoptysis, sputum production and wheezing.   Cardiovascular:  Negative for chest pain, palpitations, orthopnea, claudication and leg swelling.  Gastrointestinal:   Negative for abdominal pain, heartburn, nausea and vomiting.  Genitourinary: Negative.   Musculoskeletal:  Negative for joint pain and myalgias.  Skin:  Negative for rash.  Neurological:  Negative for weakness.  Endo/Heme/Allergies: Negative.   Psychiatric/Behavioral: Negative.       Objective:   Vitals:   06/25/21 1033  BP: (!) 128/94  Pulse: (!) 104  Temp: 98.5  F (36.9 C)  TempSrc: Oral  SpO2: 98%  Weight: 193 lb 3.2 oz (87.6 kg)  Height: '5\' 1"'$  (1.549 m)     Physical Exam Constitutional:      General: She is not in acute distress.    Appearance: She is obese. She is not ill-appearing.  HENT:     Head: Normocephalic and atraumatic.     Nose: Rhinorrhea present.     Mouth/Throat:     Mouth: Mucous membranes are moist.  Eyes:     General: No scleral icterus.    Conjunctiva/sclera: Conjunctivae normal.     Pupils: Pupils are equal, round, and reactive to light.  Cardiovascular:     Rate and Rhythm: Normal rate and regular rhythm.     Pulses: Normal pulses.     Heart sounds: Normal heart sounds. No murmur heard. Pulmonary:     Effort: Pulmonary effort is normal.     Breath sounds: Normal breath sounds. No wheezing, rhonchi or rales.  Abdominal:     General: Bowel sounds are normal.     Palpations: Abdomen is soft.  Musculoskeletal:     Right lower leg: No edema.     Left lower leg: No edema.  Lymphadenopathy:     Cervical: No cervical adenopathy.  Skin:    General: Skin is warm and dry.  Neurological:     General: No focal deficit present.     Mental Status: She is alert.  Psychiatric:        Mood and Affect: Mood normal.        Behavior: Behavior normal.        Thought Content: Thought content normal.        Judgment: Judgment normal.   CBC    Component Value Date/Time   WBC 10.2 05/16/2021 0000   RBC 5.33 (H) 05/16/2021 0000   HGB 14.5 05/16/2021 0000   HCT 45.6 (H) 05/16/2021 0000   PLT 255 05/16/2021 0000   MCV 85.6 05/16/2021 0000   MCH  27.2 05/16/2021 0000   MCHC 31.8 (L) 05/16/2021 0000   RDW 13.3 05/16/2021 0000   LYMPHSABS 3,825 05/16/2021 0000   MONOABS 0.6 06/10/2019 1500   EOSABS 194 05/16/2021 0000   BASOSABS 51 05/16/2021 0000   BMP Latest Ref Rng & Units 05/16/2021 04/23/2021 12/31/2020  Glucose 65 - 99 mg/dL 262(H) 234(H) 335(H)  BUN 7 - 25 mg/dL '8 8 6  '$ Creatinine 0.50 - 1.05 mg/dL 0.62 0.74 0.59  BUN/Creat Ratio 6 - 22 (calc) NOT APPLICABLE NOT APPLICABLE 10  Sodium A999333 - 146 mmol/L 141 141 139  Potassium 3.5 - 5.3 mmol/L 4.2 4.1 4.4  Chloride 98 - 110 mmol/L 103 105 102  CO2 20 - 32 mmol/L '30 29 21  '$ Calcium 8.6 - 10.4 mg/dL 9.1 9.4 9.3   Chest imaging: CT Chest 05/24/21 Mediastinum/Nodes: Thoracic inlet is within normal limits. No sizable hilar or mediastinal adenopathy is noted. The esophagus is within normal limits.   Lungs/Pleura: Lungs are well aerated bilaterally. No focal infiltrate or sizable effusion is seen. No residual abnormalities to correspond with the prior COVID infection are noted. Minimal scarring in the left lower lobe is seen.  PFT: No flowsheet data found.  Home sleep study 08/19/2018 Severe Obstructive sleep apnea, AHI 99/hr No significant central sleep apneas Oxygen nadir 69%   Assessment & Plan:   Cough - Plan: mometasone-formoterol (DULERA) 200-5 MCG/ACT AERO, Pulmonary Function Test  Tobacco dependence  Discussion: Terri Zimmerman is a 55  year old woman, daily smoker with history of diabetes mellitus II, hypertension and asthma who is referred to pulmonary clinic for cough and history of covid 19 infection.   The etiology of her cough is either due to obstructive lung disease/reactive airways disease vs postnasal drainage vs gastroesophageal reflux disease.  CT chest scan does show mild thickening of her airways concerning for bronchitis due to smoking.  We will start her on ICS/LABA inhaler therapy with Dulera 200-5 MCG 2 puffs twice daily.  She is to call our clinic in  a month and let us know if she has not received any benefit and then we will move to treat her with antacid medication or nasal sprays.  She is to follow-up in 2 months with pulmonary function test.  We discussed smoking cessation options with Chantix, Wellbutrin and nicotine replacement therapy.  I have recommended that she start with nicotine replacement therapy with a 7 to 14 mg daily patch along with nicotine lozenges or gum as needed for breakthrough cravings.  She does qualify for lung cancer screening but she had a CT scan done last month which does not show any concerning nodules or lesions so she should receive a referral to lung cancer screening program next year.  Freda Jackson, MD [facility] Office: [phone]   Current Outpatient Medications:    acetaminophen (TYLENOL) 325 MG tablet, Take 650 mg by mouth 2 (two) times daily., Disp: , Rfl:    albuterol (VENTOLIN HFA) 108 (90 Base) MCG/ACT inhaler, TAKE 2 PUFFS BY MOUTH EVERY 6 HOURS AS NEEDED, Disp: 8.5 each, Rfl: 1   AMBULATORY NON FORMULARY MEDICATION, 1 each by Other route 2 (two) times daily. Lancets for daily glucose checks Diagnosis: E11.9 Testing 2 times a day, Disp: 200 each, Rfl: 5   glucose blood (ONE TOUCH ULTRA TEST) test strip, USE TO TEST BLOOD SUGARS 2 TIMES A DAY. DX:E11.8, Disp: 200 each, Rfl: 11   Insulin Glargine (BASAGLAR KWIKPEN) 100 UNIT/ML, INJECT 80 UNITS INTO THE SKIN TWICE DAILY. DO NOT MIX WITH OTHER INSULINS, Disp: 135 mL, Rfl: 1   insulin lispro (HUMALOG KWIKPEN) 100 UNIT/ML KwikPen, Inject 5 Units into the skin daily with breakfast AND 10 Units daily with lunch AND 12 Units daily with supper., Disp: 30 mL, Rfl: 6   Insulin Pen Needle (B-D UF III MINI PEN NEEDLES) 31G X 5 MM MISC, USE TO INJECT INSULIN UP TO THREE (3) TIMES DAILY. DIAGNOSIS DM E11.9, Disp: 1 each, Rfl: 33   metFORMIN (GLUCOPHAGE-XR) 500 MG 24 hr tablet, Take 2 tablets (1,000 mg total) by mouth daily with breakfast  AND 1 tablet (500 mg total) daily with supper., Disp: 270 tablet, Rfl: 1   metoprolol succinate (TOPROL-XL) 25 MG 24 hr tablet, TAKE 1 TABLET BY MOUTH EVERYDAY AT BEDTIME, Disp: 90 tablet, Rfl: 1   mometasone-formoterol (DULERA) 200-5 MCG/ACT AERO, Inhale 2 puffs into the lungs in the morning and at bedtime., Disp: 1 each, Rfl: 0   Multiple Vitamin (MULTIVITAMIN) tablet, Take 1 tablet by mouth daily., Disp: , Rfl:    OneTouch Delica Lancets 99991111 MISC, UAD AS DIRECTED, Disp: , Rfl:    valsartan (DIOVAN) 160 MG tablet, Take 1 tablet (160 mg total) by mouth daily., Disp: 90 tablet, Rfl: 3

## 2021-06-25 NOTE — Patient Instructions (Addendum)
Start Dulera 2 puffs twice daily - rinse mouth out after each use  Call us in a month if your cough has not improved  Follow up in 2 months with pulmonary function tests  Recommend smoking cessation with nicotine replacement therapy.  - Nicotine patches, recommend starting with a 7 or '14mg'$  patch per day - Can use nicotine lozenges or gum

## 2021-07-07 ENCOUNTER — Encounter: Payer: Self-pay | Admitting: Family Medicine

## 2021-07-07 ENCOUNTER — Telehealth (INDEPENDENT_AMBULATORY_CARE_PROVIDER_SITE_OTHER): Payer: Managed Care, Other (non HMO) | Admitting: Family Medicine

## 2021-07-07 DIAGNOSIS — J069 Acute upper respiratory infection, unspecified: Secondary | ICD-10-CM

## 2021-07-07 MED ORDER — FLUTICASONE PROPIONATE 50 MCG/ACT NA SUSP: 2.0000 | Freq: Every day | NASAL | 0 refills | Status: DC

## 2021-07-07 NOTE — Progress Notes (Signed)
Virtual Video Visit via MyChart Note  I connected with  Terri Zimmerman on 07/07/21 at 11:10 AM EDT by the video enabled telemedicine application for MyChart, and verified that I am speaking with the correct person using two identifiers.   I introduced myself as a Designer, jewellery with the practice. We discussed the limitations of evaluation and management by telemedicine and the availability of in person appointments. The patient expressed understanding and agreed to proceed.  Participating parties in this visit include: The patient and the nurse practitioner listed.  The patient is: At home I am: In the office - [place]  Subjective:    CC:  Chief Complaint  Patient presents with   URI    HPI: Terri Zimmerman is a 55 y.o. year old female presenting today via [place] today for URI symptoms.  Patient states that about 4-5 days ago she started feeling "cruddy." Reports significant frontal sinus pressure, mild sinus pain, nasal congestion, non-productive cough (her baseline), sneezing. She denies any fevers, trouble breathing, ear pain, chest pain, GI/GU symptoms. States she had a negative home COVID test last Thursday and today. Feels like she can barely breathe out of her nose because of the congestion. She has been getting some relief with mucinex and nighttime cold medicine. States she has had other similar illnesses like this over the summer for which she received steroids and antibiotics.   BP at home today is 156/96     Past medical history, Surgical history, Family history not pertinant except as noted below, Social history, Allergies, and medications have been entered into the medical record, reviewed, and corrections made.   Review of Systems:  All review of systems negative except what is listed in the HPI   Objective:    General:  Speaking clearly in complete sentences. Absent shortness of breath noted.   Alert and oriented x3.   Normal judgment.   Absent acute distress.   Impression and Recommendations:    1. Viral upper respiratory tract infection On day 4-5 of symptoms, COVID negative. Given that she has been on several antibiotics and steroid bursts over the past 2 months, as well as having elevated BPs and A1c, would prefer to hold off on steroids and antibiotics until trying a few more days of conservative therapies. Recommend she use Flonase, Mucinex, Coricidin, tylenol, occasional ibuprofen (limit due to BP), Vicks VapoRub, cool mist humidifier, warm compresses, steam showers, allergy medications (Claritin, etc.), rest, hydration, warm liquids with honey/lemon. Patient aware of signs/symptoms requiring further/urgent evaluation. If no improvement or if symptoms worsening by the end of the week, follow-up to consider additional ABX, steroids, etc as indicated. Also recommend she consider seeing an allergist and/or ENT as this seems to be a trend of frequent colds and sinus infections. Patient pleasant and agreeable to plan.   - fluticasone (FLONASE) 50 MCG/ACT nasal spray; Place 2 sprays into both nostrils daily.  Dispense: 1 g; Refill: 0   Follow-up if symptoms worsen or fail to improve.    I discussed the assessment and treatment plan with the patient. The patient was provided an opportunity to ask questions and all were answered. The patient agreed with the plan and demonstrated an understanding of the instructions.   The patient was advised to call back or seek an in-person evaluation if the symptoms worsen or if the condition fails to improve as anticipated.  I spent 20 minutes dedicated to the care of this patient on the date of this  encounter to include pre-visit chart review of prior notes and results, face-to-face time with the patient, and post-visit ordering of testing as indicated.   [employer]cell Nails Olevia Bowens, DNP, FNP-C

## 2021-07-10 ENCOUNTER — Telehealth: Payer: Self-pay

## 2021-07-10 NOTE — Telephone Encounter (Signed)
Terri Zimmerman advised of recommendations. She will call back if needed.

## 2021-07-10 NOTE — Telephone Encounter (Signed)
Terri Zimmerman states she still has nasal congestion. She states the Flonase is not helping. She wanted to know if there was anything else she can [place]. Please advise.

## 2021-07-10 NOTE — Telephone Encounter (Signed)
If that is her primary symptom, then she can use OTC Afrin for 3-4 days, advise against prolonged use (rebound congestion). If she is having other sinus infection symptoms then we could do a course of Augmentin as she has been with symptoms > 1 week. She has had several antibiotics recently so I was trying to hold off if possible.

## 2021-07-22 ENCOUNTER — Encounter: Payer: Self-pay | Admitting: Internal Medicine

## 2021-07-22 ENCOUNTER — Ambulatory Visit (INDEPENDENT_AMBULATORY_CARE_PROVIDER_SITE_OTHER): Payer: Managed Care, Other (non HMO) | Admitting: Internal Medicine

## 2021-07-22 ENCOUNTER — Other Ambulatory Visit: Payer: Self-pay

## 2021-07-22 VITALS — BP 144/98 | HR 96 | Ht 61.0 in | Wt 192.0 lb

## 2021-07-22 DIAGNOSIS — E1165 Type 2 diabetes mellitus with hyperglycemia: Secondary | ICD-10-CM | POA: Diagnosis not present

## 2021-07-22 DIAGNOSIS — Z794 Long term (current) use of insulin: Secondary | ICD-10-CM

## 2021-07-22 DIAGNOSIS — D3501 Benign neoplasm of right adrenal gland: Secondary | ICD-10-CM

## 2021-07-22 LAB — POCT GLYCOSYLATED HEMOGLOBIN (HGB A1C): Hemoglobin A1C: 8.2 % — AB (ref 4.0–5.6)

## 2021-07-22 LAB — GLUCOSE, POCT (MANUAL RESULT ENTRY): POC Glucose: 166 mg/dl — AB (ref 70–99)

## 2021-07-22 MED ORDER — DEXCOM G6 SENSOR MISC: 1.0000 | 3 refills | Status: DC

## 2021-07-22 MED ORDER — TRESIBA FLEXTOUCH 200 UNIT/ML ~~LOC~~ SOPN: 82.0000 [IU] | PEN_INJECTOR | Freq: Two times a day (BID) | SUBCUTANEOUS | 3 refills | Status: DC

## 2021-07-22 MED ORDER — DEXCOM G6 TRANSMITTER MISC: 1.0000 | 3 refills | Status: DC

## 2021-07-22 NOTE — Progress Notes (Signed)
Name: Terri Zimmerman  Age/ Sex: 55 y.o., female   MRN/ DOB: [id number], 21-Sep-1966     PCP: Hali Marry, MD   Reason for Endocrinology Evaluation: Type 2 Diabetes Mellitus  Initial Endocrine Consultative Visit: 12/31/2020    PATIENT IDENTIFIER: Terri Zimmerman is a 55 y.o. female with a past medical history of T2DM,HTN, Hx of Pancreatitis  . The patient has followed with Endocrinology clinic since 12/31/2020 for consultative assistance with management of her diabetes.  DIABETIC HISTORY:  Terri Zimmerman was diagnosed with DM  In 2009, Ozempic/ Victoza Abdominal pain/diarrhea, Invokana - back pain. Onglyza- Pancreatitis. Farxiga - genital infections. Has been on insulin since her diagnosis.  Her hemoglobin A1c has ranged from 6.8 % in 2017, peaking at 9.4% in 2021.  On her initial visit to our clinic she had an A1c of 9.3% . She was on Metformin and basaglar, she also had a prescription of prandial insulin but was not taking. We attempted a small dose of Farxiga but this has to be discontinued due to genital infection.   ADRENAL HISTORY: She had an incidental finding of a right adrenal adenoma on abdominal CT 2017 measuring 2.8 cm during evaluation of abdominal pain.  Pheo has been ruled out with normal metanephrines and nor metanephrines , aldo: renin normal ( 12/2020) She had elevated 24-hr urinary cortisol at 44 ( reference for [facility] 42)      SUBJECTIVE:   During the last visit (04/15/2021): A1c  9.6% , we increased  metformin and continued basaglar and prandial insulin   Today (07/22/2021): Terri Zimmerman is here for a follow up on diabetes management.  She checks her blood sugars occasionally . The patient has not had hypoglycemic episodes since the last clinic visit.  She has chronic GI issues with occasional nausea and one episode of diarrhea yesterday  She has been on prednisone and Abx due to URI symptoms   She fell a month ago and has been having right knee, chest contusion and  occasional right leg swelling   HOME DIABETES REGIMEN:  Metformin 500 mg XR 2 tabs with Breakfast and 1 tab with Supper Basagalr 80 units BID  Humalog 5 units with Breakfast Snack,  10 units with lunch and 12 units with supper     Statin: yes ACE-I/ARB: yes Prior Diabetic Education: yes    DIABETIC COMPLICATIONS: Microvascular complications:   Denies: CKD, retinopathy, neuropathy Last Eye Exam: Completed 06/2020  Macrovascular complications:   Denies: CAD, CVA, PVD   HISTORY:  Past Medical History:  Past Medical History:  Diagnosis Date   Asthma 04-25-07   Diabetes mellitus 11-12-06   type 2   Hypertension 11-04-06   Past Surgical History:  Past Surgical History:  Procedure Laterality Date   ABDOMINAL HYSTERECTOMY  12-11   Complete   APPENDECTOMY  9-00   BREAST BIOPSY Left    BREAST CYST EXCISION Left    BREAST SURGERY  4-00   of cyst   FOOT TENDON SURGERY  05/16/2012   bone spur.     LAPAROSCOPY  4-08   for cyst and endometriosis   left breast bx  12-11   Social History:  reports that she has been smoking cigarettes. She has been smoking an average of .5 packs per day. She has never used smokeless tobacco. She reports that she does not drink alcohol and does not use drugs. Family History:  Family History  Problem Relation Age of Onset   Other Mother  RA and CHF   Diabetes Father    Diabetes Other    Heart disease Other      HOME MEDICATIONS: Allergies as of 07/22/2021       Reactions   Trulicity [dulaglutide] Nausea Only   Abdominal pain,   Jentadueto [linagliptin-metformin Hcl Er]    Abdominal pains and diarrhea   Lipitor [atorvastatin]    Sudden hair loss 2 weeks after starting lipitor.    Onglyza [saxagliptin] Other (See Comments)   Pancreatitis   Ozempic (0.25 Or 0.5 Mg-dose) [semaglutide(0.25 Or 0.'5mg'$ -dos)] Diarrhea, Other (See Comments)   Abdominal pain   Victoza [liraglutide] Other (See Comments)   Abdominal pain and nausea.          Medication List        Accurate as of July 22, 2021  8:25 AM. If you have any questions, ask your nurse or doctor.          STOP taking these medications    Basaglar KwikPen 100 UNIT/ML Stopped by: Dorita Sciara, MD       TAKE these medications    acetaminophen 325 MG tablet Commonly known as: TYLENOL Take 650 mg by mouth 2 (two) times daily.   albuterol 108 (90 Base) MCG/ACT inhaler Commonly known as: VENTOLIN HFA TAKE 2 PUFFS BY MOUTH EVERY 6 HOURS AS NEEDED   AMBULATORY NON FORMULARY MEDICATION 1 each by Other route 2 (two) times daily. Lancets for daily glucose checks Diagnosis: E11.9 Testing 2 times a day   B-D UF III MINI PEN NEEDLES 31G X 5 MM Misc Generic drug: Insulin Pen Needle USE TO INJECT INSULIN UP TO THREE (3) TIMES DAILY. DIAGNOSIS DM E11.9   Dexcom G6 Sensor Misc 1 Device by Does not apply route as directed. Started by: Dorita Sciara, MD   Dexcom G6 Transmitter Misc 1 Device by Does not apply route as directed. Started by: Dorita Sciara, MD   fluticasone 50 MCG/ACT nasal spray Commonly known as: Flonase Place 2 sprays into both nostrils daily.   glucose blood test strip Commonly known as: ONE TOUCH ULTRA TEST USE TO TEST BLOOD SUGARS 2 TIMES A DAY. DX:E11.8   insulin lispro 100 UNIT/ML KwikPen Commonly known as: HumaLOG KwikPen Inject 5 Units into the skin daily with breakfast AND 10 Units daily with lunch AND 12 Units daily with supper.   metFORMIN 500 MG 24 hr tablet Commonly known as: GLUCOPHAGE-XR Take 2 tablets (1,000 mg total) by mouth daily with breakfast AND 1 tablet (500 mg total) daily with supper.   metoprolol succinate 25 MG 24 hr tablet Commonly known as: TOPROL-XL TAKE 1 TABLET BY MOUTH EVERYDAY AT BEDTIME   mometasone-formoterol 200-5 MCG/ACT Aero Commonly known as: DULERA Inhale 2 puffs into the lungs in the morning and at bedtime.   multivitamin tablet Take 1 tablet by mouth daily.    OneTouch Delica Lancets 99991111 Misc UAD AS DIRECTED   Tresiba FlexTouch 200 UNIT/ML FlexTouch Pen Generic drug: insulin degludec Inject 82 Units into the skin in the morning and at bedtime. Started by: Dorita Sciara, MD   valsartan 160 MG tablet Commonly known as: DIOVAN Take 1 tablet (160 mg total) by mouth daily.         OBJECTIVE:   Vital Signs: BP (!) 144/98 (BP Location: Right Arm, Patient Position: Sitting, Cuff Size: Small)   Pulse 96   Ht '5\' 1"'$  (1.549 m)   Wt 192 lb (87.1 kg)   SpO2 94%  BMI 36.28 kg/m   Wt Readings from Last 3 Encounters:  07/22/21 192 lb (87.1 kg)  06/25/21 193 lb 3.2 oz (87.6 kg)  05/16/21 193 lb (87.5 kg)     Exam: General: Pt appears well and is in NAD  Lungs: Clear with good BS bilat with no rales, rhonchi, or wheezes  Heart: RRR    Extremities: Mild right knee swelling noted, but no erythema or warmth, No pretibial edema. .  Neuro: MS is good with appropriate affect, pt is alert and Ox3    DM foot exam: 04/15/2021  The skin of the feet is intact without sores or ulcerations. The pedal pulses are 2+ on right and 2+ on left. The sensation is intact to a screening 5.07, 10 gram monofilament bilaterally   DATA REVIEWED:  Lab Results  Component Value Date   HGBA1C 8.2 (A) 07/22/2021   HGBA1C 9.6 (A) 04/15/2021   HGBA1C 9.3 (A) 12/31/2020   Lab Results  Component Value Date   MICROALBUR 80 04/23/2021   LDLCALC 52 04/26/2020   CREATININE 0.62 05/16/2021   Lab Results  Component Value Date   MICRALBCREAT 30 04/23/2021     Lab Results  Component Value Date   CHOL 116 04/26/2020   HDL 39 (L) 04/26/2020   LDLCALC 52 04/26/2020   TRIG 168 (H) 04/26/2020   CHOLHDL 3.0 04/26/2020       CT 12/27/2017 Adrenals/Urinary Tract: 3.0 cm right adrenal adenoma, grossly unchanged. Left adrenal gland is within normal limits.      ASSESSMENT / PLAN / RECOMMENDATIONS:   1) Type 2 Diabetes Mellitus, Poorly controlled,  Without complications - Most recent A1c of 9.6 %. Goal A1c < 7.0 %.   - A1c is trending down , limited data today as she did not bring her meter nor has been checking on regular basis.  - I prescribed Dexcom in the past but she has not heard anything, I encouraged the pt to check her vpoice mails, because [place] are very communicative  - I will switch Basaglar to [place] as its more concentrated  - She developed pancreatitis to DPP-4 inhibitors per chart . GLP-1 agonists and DPP-4 inhibitors are contra-indicated  - Will not increase Metformin at this time , she had an episode of diarrhea yesterday, not likely not related to medication at this time.   MEDICATIONS: - Continue  Metformin 500 mg XR 2 tablet with breakfast and 1 tablet with Supper - Will switch Basaglar to [place] and increase to 82 units BID  - HUmalog 5 units with Breakfast snack,10 units with Lunch and 12 units with Supper    EDUCATION / INSTRUCTIONS: BG monitoring instructions: Patient is instructed to check her blood sugars 3 times a day, before meals . Call [place] [facility] if: BG persistently < 70 I reviewed the Rule of 15 for the treatment of hypoglycemia in detail with the patient. Literature supplied.   2) Diabetic complications:  Eye: Does not have known diabetic retinopathy.  Neuro/ Feet: Does not have known diabetic peripheral neuropathy. Renal: Patient does not have known baseline CKD. She is not on an ACEI/ARB at present.     4) Right adrenal adenoma:     - Eighty five percent of adrenal adenomas are nonsecretory.  - Size slightly increased from 2.8 to 3 cm over a 2 yr period - Will consider repeat imaging for stability  - Aldo: renin and metanephrines are negative  - Her 24-hr urinary cortisol at 44 ug/L  per text book this is normal but per [facility] this is slightly above goal of 42 ug/L. She recently received prednisone, will hold off on repeat 24-hr urinary cortisol for until she has  not had any glucocorticoids for 3 months.     F/U in 4 months    Signed electronically by: Mack Guise, MD  [facility]  [facility] [address] [place], [place] [address] Phone: [phone] FAX: [phone]   CC: Hali Marry, [place] [address] [place] [address] Phone: [phone]  Fax: [phone]  Return to Endocrinology clinic as below: Future Appointments  Date Time Provider [place]  08/28/2021  9:00 AM [facility] None  08/28/2021 10:00 AM Freddi Starr, MD [facility] None

## 2021-07-22 NOTE — Patient Instructions (Addendum)
-   Continue Metformin 500 mg XR 2 tablet with breakfast and 1 tablet with Supper - I will switch Basaglar to [place] 82 units TWICE a day  -Continue  HUmalog 5 units with Breakfast snack,10 units with Lunch and 12 units with Supper     HOW TO TREAT LOW BLOOD SUGARS (Blood sugar LESS THAN 70 MG/DL) Please follow the RULE OF 15 for the treatment of hypoglycemia treatment (when your (blood sugars are less than 70 mg/dL)   STEP 1: Take 15 grams of carbohydrates when your blood sugar is low, which includes:  3-4 GLUCOSE TABS  OR 3-4 OZ OF JUICE OR REGULAR SODA OR ONE TUBE OF GLUCOSE GEL    STEP 2: RECHECK blood sugar in 15 MINUTES STEP 3: If your blood sugar is still low at the 15 minute recheck --> then, go back to STEP 1 and treat AGAIN with another 15 grams of carbohydrates.

## 2021-07-28 ENCOUNTER — Telehealth: Payer: Self-pay

## 2021-07-28 NOTE — Telephone Encounter (Signed)
Pt called in for a prescription for her Dexcom G6 receiver to [place], [place]. Any questions [phone].

## 2021-07-30 ENCOUNTER — Ambulatory Visit (INDEPENDENT_AMBULATORY_CARE_PROVIDER_SITE_OTHER): Payer: Managed Care, Other (non HMO)

## 2021-07-30 ENCOUNTER — Other Ambulatory Visit: Payer: Self-pay

## 2021-07-30 ENCOUNTER — Ambulatory Visit (INDEPENDENT_AMBULATORY_CARE_PROVIDER_SITE_OTHER): Payer: Managed Care, Other (non HMO) | Admitting: Sports Medicine

## 2021-07-30 DIAGNOSIS — G8929 Other chronic pain: Secondary | ICD-10-CM | POA: Diagnosis not present

## 2021-07-30 DIAGNOSIS — M25561 Pain in right knee: Secondary | ICD-10-CM

## 2021-07-30 DIAGNOSIS — M1711 Unilateral primary osteoarthritis, right knee: Secondary | ICD-10-CM

## 2021-07-30 NOTE — Progress Notes (Signed)
    Procedures performed today:    Procedure: Real-time Ultrasound Guided aspiration/injection of right knee Device: Samsung HS60  Verbal informed consent obtained.  Time-out conducted.  Noted no overlying erythema, induration, or other signs of local infection.  Skin prepped in a sterile fashion.  Local anesthesia: Topical Ethyl chloride.  With sterile technique and under real time ultrasound guidance: I advanced an 18-gauge needle into the suprapatellar recess, aspirated 15 mL of clear, straw-colored fluid, syringe switched and 1 cc Kenalog 40, 2 cc lidocaine, 2 cc bupivacaine injected easily Completed without difficulty  Advised to call if fevers/chills, erythema, induration, drainage, or persistent bleeding.  Images permanently stored and available for review in PACS.  Impression: Technically successful ultrasound guided injection.  Independent interpretation of notes and tests performed by another provider:   None.  Brief History, Exam, Impression, and Recommendations:    Primary osteoarthritis of right knee This is a pleasant 55 year old female, she had a couple weeks of increasing pain, swelling in her right knee with radiation down the leg, she is getting some swelling down the leg, she has pain at the medial joint line. On exam she has a right knee effusion, trace edema in the right lower extremity, essentially negative Homans' sign, pain at the medial joint line. Today we performed a knee aspiration and injection, we will get some x-rays, have her do some home conditioning exercises and return to see me in 4 to 6 weeks.    ___________________________________________ Gwen Her. Dianah Field, M.D., ABFM., CAQSM. Primary Care and [place] Instructor of [place] [employer] [facility]

## 2021-07-30 NOTE — Assessment & Plan Note (Signed)
This is a pleasant 55 year old female, she had a couple weeks of increasing pain, swelling in her right knee with radiation down the leg, she is getting some swelling down the leg, she has pain at the medial joint line. On exam she has a right knee effusion, trace edema in the right lower extremity, essentially negative Homans' sign, pain at the medial joint line. Today we performed a knee aspiration and injection, we will get some x-rays, have her do some home conditioning exercises and return to see me in 4 to 6 weeks.

## 2021-08-15 ENCOUNTER — Other Ambulatory Visit: Payer: Self-pay | Admitting: Family Medicine

## 2021-08-15 DIAGNOSIS — Z1231 Encounter for screening mammogram for malignant neoplasm of breast: Secondary | ICD-10-CM

## 2021-08-19 ENCOUNTER — Other Ambulatory Visit: Payer: Self-pay | Admitting: Family Medicine

## 2021-08-28 ENCOUNTER — Ambulatory Visit: Payer: Managed Care, Other (non HMO) | Admitting: Pulmonary Disease

## 2021-08-29 ENCOUNTER — Other Ambulatory Visit: Payer: Self-pay

## 2021-08-29 DIAGNOSIS — E1165 Type 2 diabetes mellitus with hyperglycemia: Secondary | ICD-10-CM

## 2021-08-29 DIAGNOSIS — Z794 Long term (current) use of insulin: Secondary | ICD-10-CM

## 2021-08-29 MED ORDER — DEXCOM G6 RECEIVER DEVI
0 refills | Status: DC
Start: 2021-08-29 — End: 2021-12-01

## 2021-08-29 NOTE — Telephone Encounter (Signed)
Rx sent 

## 2021-08-29 NOTE — Telephone Encounter (Signed)
Pt calling in to request receiver for Dexcom G6 to her [place], [place]. Please contact patient [phone]

## 2021-09-10 ENCOUNTER — Ambulatory Visit: Payer: Managed Care, Other (non HMO) | Admitting: Sports Medicine

## 2021-09-24 ENCOUNTER — Ambulatory Visit: Payer: Managed Care, Other (non HMO) | Admitting: Pulmonary Disease

## 2021-09-24 ENCOUNTER — Other Ambulatory Visit: Payer: Self-pay

## 2021-09-24 ENCOUNTER — Ambulatory Visit (INDEPENDENT_AMBULATORY_CARE_PROVIDER_SITE_OTHER): Payer: Managed Care, Other (non HMO)

## 2021-09-24 DIAGNOSIS — Z1231 Encounter for screening mammogram for malignant neoplasm of breast: Secondary | ICD-10-CM

## 2021-09-24 NOTE — Progress Notes (Signed)
Please call patient. Normal mammogram.  Repeat in 1 year.  

## 2021-09-30 LAB — HM DIABETES EYE EXAM

## 2021-10-03 ENCOUNTER — Encounter: Payer: Self-pay | Admitting: Family Medicine

## 2021-10-05 ENCOUNTER — Other Ambulatory Visit: Payer: Self-pay | Admitting: Internal Medicine

## 2021-10-05 DIAGNOSIS — E1165 Type 2 diabetes mellitus with hyperglycemia: Secondary | ICD-10-CM

## 2021-10-05 DIAGNOSIS — Z794 Long term (current) use of insulin: Secondary | ICD-10-CM

## 2021-11-25 ENCOUNTER — Ambulatory Visit: Payer: Managed Care, Other (non HMO) | Admitting: Internal Medicine

## 2021-12-01 ENCOUNTER — Encounter: Payer: Self-pay | Admitting: Internal Medicine

## 2021-12-01 ENCOUNTER — Other Ambulatory Visit: Payer: Self-pay

## 2021-12-01 ENCOUNTER — Ambulatory Visit (INDEPENDENT_AMBULATORY_CARE_PROVIDER_SITE_OTHER): Payer: Managed Care, Other (non HMO) | Admitting: Internal Medicine

## 2021-12-01 VITALS — BP 136/80 | HR 100 | Ht 61.0 in | Wt 188.6 lb

## 2021-12-01 DIAGNOSIS — Z794 Long term (current) use of insulin: Secondary | ICD-10-CM | POA: Diagnosis not present

## 2021-12-01 DIAGNOSIS — E785 Hyperlipidemia, unspecified: Secondary | ICD-10-CM

## 2021-12-01 DIAGNOSIS — R7989 Other specified abnormal findings of blood chemistry: Secondary | ICD-10-CM | POA: Diagnosis not present

## 2021-12-01 DIAGNOSIS — E1165 Type 2 diabetes mellitus with hyperglycemia: Secondary | ICD-10-CM

## 2021-12-01 LAB — BASIC METABOLIC PANEL
BUN: 6 mg/dL (ref 6–23)
CO2: 28 mEq/L (ref 19–32)
Calcium: 9.1 mg/dL (ref 8.4–10.5)
Chloride: 104 mEq/L (ref 96–112)
Creatinine, Ser: 0.54 mg/dL (ref 0.40–1.20)
GFR: 103.26 mL/min (ref >60.00)
Glucose, Bld: 111 mg/dL — ABNORMAL HIGH (ref 70–99)
Potassium: 3.5 mEq/L (ref 3.5–5.1)
Sodium: 142 mEq/L (ref 135–145)

## 2021-12-01 LAB — POCT GLYCOSYLATED HEMOGLOBIN (HGB A1C): Hemoglobin A1C: 8.9 % — AB (ref 4.0–5.6)

## 2021-12-01 LAB — MICROALBUMIN / CREATININE URINE RATIO
Creatinine,U: 131.4 mg/dL
Microalb Creat Ratio: 0.7 mg/g (ref 0.0–30.0)
Microalb, Ur: 1 mg/dL (ref 0.0–1.9)

## 2021-12-01 LAB — LIPID PANEL
Cholesterol: 148 mg/dL (ref 0–200)
HDL: 35.5 mg/dL — ABNORMAL LOW (ref >39.00)
LDL Cholesterol: 93 mg/dL (ref 0–99)
NonHDL: 112.57
Total CHOL/HDL Ratio: 4
Triglycerides: 100 mg/dL (ref 0.0–149.0)
VLDL: 20 mg/dL (ref 0.0–40.0)

## 2021-12-01 LAB — POCT GLUCOSE (DEVICE FOR HOME USE): Glucose Fasting, POC: 119 mg/dL — AB (ref 70–99)

## 2021-12-01 MED ORDER — TRESIBA FLEXTOUCH 200 UNIT/ML ~~LOC~~ SOPN: 82.0000 [IU] | PEN_INJECTOR | Freq: Two times a day (BID) | SUBCUTANEOUS | 3 refills | Status: AC

## 2021-12-01 MED ORDER — METFORMIN HCL ER 500 MG PO TB24: ORAL_TABLET | ORAL | 3 refills | Status: AC

## 2021-12-01 MED ORDER — INSULIN LISPRO (1 UNIT DIAL) 100 UNIT/ML (KWIKPEN): PEN_INJECTOR | SUBCUTANEOUS | 6 refills | Status: DC

## 2021-12-01 MED ORDER — BD PEN NEEDLE MINI U/F 31G X 5 MM MISC: 1.0000 | Freq: Four times a day (QID) | 3 refills | Status: AC

## 2021-12-01 NOTE — Patient Instructions (Addendum)
°-   Continue Metformin 500 mg XR 2 tablet with breakfast and 1 tablet with Supper -Continue Tresiba 82 units TWICE a day  -Take  HUmalog 5 units with Breakfast ,10 units with Lunch and 12 units with Supper     HOW TO TREAT LOW BLOOD SUGARS (Blood sugar LESS THAN 70 MG/DL) Please follow the RULE OF 15 for the treatment of hypoglycemia treatment (when your (blood sugars are less than 70 mg/dL)   STEP 1: Take 15 grams of carbohydrates when your blood sugar is low, which includes:  3-4 GLUCOSE TABS  OR 3-4 OZ OF JUICE OR REGULAR SODA OR ONE TUBE OF GLUCOSE GEL    STEP 2: RECHECK blood sugar in 15 MINUTES STEP 3: If your blood sugar is still low at the 15 minute recheck --> then, go back to STEP 1 and treat AGAIN with another 15 grams of carbohydrates.   24-Hour Urine Collection  You will be collecting your urine for a 24-hour period of time. Your timer starts with your first urine of the morning (For example - If you first pee at [place], your timer will start at [place]) [place] away your first urine of the morning Collect your urine every time you pee for the next 24 hours STOP your urine collection 24 hours after you started the collection (For example - You would stop at 9AM the day after you started)

## 2021-12-01 NOTE — Progress Notes (Signed)
Name: Terri Zimmerman  Age/ Sex: 56 y.o., female   MRN/ DOB: [id number], 11/24/1965     PCP: Hali Marry, MD   Reason for Endocrinology Evaluation: Type 2 Diabetes Mellitus  Initial Endocrine Consultative Visit: 12/31/2020    PATIENT IDENTIFIER: [name] is a 56 y.o. female with a past medical history of T2DM,HTN, Hx of Pancreatitis  . The patient has followed with Endocrinology clinic since 12/31/2020 for consultative assistance with management of her diabetes.     DIABETIC HISTORY:  Ms. Stanish was diagnosed with DM  In 2009, Ozempic/ Victoza Abdominal pain/diarrhea, Invokana - back pain. Onglyza- Pancreatitis. Farxiga - genital infections. Has been on insulin since her diagnosis.  Her hemoglobin A1c has ranged from 6.8 % in 2017, peaking at 9.4% in 2021.  On her initial visit to our clinic she had an A1c of 9.3% . She was on Metformin and basaglar, she also had a prescription of prandial insulin but was not taking. We attempted a small dose of Farxiga but this has to be discontinued due to genital infection.    Dexcom was stressful      ADRENAL HISTORY: She had an incidental finding of a right adrenal adenoma on abdominal CT 2017 measuring 2.8 cm during evaluation of abdominal pain.  Pheo has been ruled out with normal metanephrines and nor metanephrines , aldo: renin normal ( 12/2020) She had elevated 24-hr urinary cortisol at 44 ( reference for [facility] 42) . We opted to repeat following improving glucose control.      SUBJECTIVE:   During the last visit (07/22/2021): A1c  9.6% , we continued  metformin and adjusted MDI regimen     Today (12/01/2021): [name] is here for a follow up on diabetes management.  She checks her blood sugars occasionally . The patient has not had hypoglycemic episodes since the last clinic visit.  She has chronic GI issues with occasional which has been improving  She is   Has noted palpitations  Has hand twitching and feet  numbness   Declines pump and CGM    HOME DIABETES REGIMEN:  Metformin 500 mg XR 2 tabs with Breakfast and 1 tab with Supper Tresiba  82 units BID  Humalog 5 units with Breakfast Snack,  10 units with lunch and 12 units with supper      Statin: yes ACE-I/ARB: yes Prior Diabetic Education: yes    DIABETIC COMPLICATIONS: Microvascular complications:   Denies: CKD, retinopathy, neuropathy Last Eye Exam: Completed 2022  Macrovascular complications:   Denies: CAD, CVA, PVD   HISTORY:  Past Medical History:  Past Medical History:  Diagnosis Date   Asthma 04-25-07   Diabetes mellitus 11-12-06   type 2   Hypertension 11-04-06   Past Surgical History:  Past Surgical History:  Procedure Laterality Date   ABDOMINAL HYSTERECTOMY  10/2010   Complete   APPENDECTOMY  07/1999   BREAST BIOPSY Left 02/1999   BREAST LUMPECTOMY WITH AXILLARY LYMPH NODE DISSECTION Left 10/2010   FOOT TENDON SURGERY  05/16/2012   bone spur.     LAPAROSCOPY  02/2007   for cyst and endometriosis   Social History:  reports that she has been smoking cigarettes. She has been smoking an average of .5 packs per day. She has never used smokeless tobacco. She reports that she does not drink alcohol and does not use drugs. Family History:  Family History  Problem Relation Age of Onset   Other Mother  RA and CHF   Diabetes Father    Diabetes Other    Heart disease Other      HOME MEDICATIONS: Allergies as of 12/01/2021       Reactions   Trulicity [dulaglutide] Nausea Only   Abdominal pain,   Jentadueto [linagliptin-metformin Hcl Er]    Abdominal pains and diarrhea   Lipitor [atorvastatin]    Sudden hair loss 2 weeks after starting lipitor.    Onglyza [saxagliptin] Other (See Comments)   Pancreatitis   Ozempic (0.25 Or 0.5 Mg-dose) [semaglutide(0.25 Or 0.5mg -dos)] Diarrhea, Other (See Comments)   Abdominal pain   Victoza [liraglutide] Other (See Comments)   Abdominal pain and nausea.          Medication List        Accurate as of December 01, 2021  1:57 PM. If you have any questions, ask your nurse or doctor.          STOP taking these medications    Dexcom G6 Sensor Misc Stopped by: Dorita Sciara, MD   Dexcom G6 Transmitter Misc Stopped by: Dorita Sciara, MD       TAKE these medications    acetaminophen 325 MG tablet Commonly known as: TYLENOL Take 650 mg by mouth 2 (two) times daily.   albuterol 108 (90 Base) MCG/ACT inhaler Commonly known as: VENTOLIN HFA TAKE 2 PUFFS BY MOUTH EVERY 6 HOURS AS NEEDED   AMBULATORY NON FORMULARY MEDICATION 1 each by Other route 2 (two) times daily. Lancets for daily glucose checks Diagnosis: E11.9 Testing 2 times a day   B-D UF III MINI PEN NEEDLES 31G X 5 MM Misc Generic drug: Insulin Pen Needle USE TO INJECT INSULIN UP TO THREE (3) TIMES DAILY. DIAGNOSIS DM E11.9   Dexcom G6 Receiver Devi Use As Directed   fluticasone 50 MCG/ACT nasal spray Commonly known as: Flonase Place 2 sprays into both nostrils daily.   glucose blood test strip Commonly known as: ONE TOUCH ULTRA TEST USE TO TEST BLOOD SUGARS 2 TIMES A DAY. DX:E11.8   insulin lispro 100 UNIT/ML KwikPen Commonly known as: HumaLOG KwikPen Inject 5 Units into the skin daily with breakfast AND 10 Units daily with lunch AND 12 Units daily with supper.   metFORMIN 500 MG 24 hr tablet Commonly known as: GLUCOPHAGE-XR TAKE 2 TABLETS (1,000 MG TOTAL) BY MOUTH DAILY WITH BREAKFAST AND 1 TABLET DAILY WITH SUPPER.   metoprolol succinate 25 MG 24 hr tablet Commonly known as: TOPROL-XL TAKE 1 TABLET BY MOUTH EVERYDAY AT BEDTIME   mometasone-formoterol 200-5 MCG/ACT Aero Commonly known as: DULERA Inhale 2 puffs into the lungs in the morning and at bedtime.   multivitamin tablet Take 1 tablet by mouth daily.   OneTouch Delica Lancets 00F Misc UAD AS DIRECTED   Tresiba FlexTouch 200 UNIT/ML FlexTouch Pen Generic drug: insulin  degludec Inject 82 Units into the skin in the morning and at bedtime.   valsartan 160 MG tablet Commonly known as: DIOVAN Take 1 tablet (160 mg total) by mouth daily.         OBJECTIVE:   Vital Signs: BP 136/80 (BP Location: Left Arm, Patient Position: Sitting, Cuff Size: Small)    Pulse 100    Ht 5\' 1"  (1.549 m)    Wt 188 lb 9.6 oz (85.5 kg)    SpO2 99%    BMI 35.64 kg/m   Wt Readings from Last 3 Encounters:  12/01/21 188 lb 9.6 oz (85.5 kg)  07/22/21 192 lb (87.1  kg)  06/25/21 193 lb 3.2 oz (87.6 kg)     Exam: General: Pt appears well and is in NAD  Lungs: Clear with good BS bilat with no rales, rhonchi, or wheezes  Heart: RRR    Extremities: Mild right knee swelling noted, but no erythema or warmth, No pretibial edema. .  Neuro: MS is good with appropriate affect, pt is alert and Ox3    DM foot exam: 04/15/2021  The skin of the feet is intact without sores or ulcerations. The pedal pulses are 2+ on right and 2+ on left. The sensation is intact to a screening 5.07, 10 gram monofilament bilaterally   DATA REVIEWED:  Lab Results  Component Value Date   HGBA1C 8.2 (A) 07/22/2021   HGBA1C 9.6 (A) 04/15/2021   HGBA1C 9.3 (A) 12/31/2020    Latest Reference Range & Units 12/01/21 14:26  Sodium 135 - 145 mEq/L 142  Potassium 3.5 - 5.1 mEq/L 3.5  Chloride 96 - 112 mEq/L 104  CO2 19 - 32 mEq/L 28  Glucose 70 - 99 mg/dL 111 (H)  BUN 6 - 23 mg/dL 6  Creatinine 0.40 - 1.20 mg/dL 0.54  Calcium 8.4 - 10.5 mg/dL 9.1  GFR >60.00 mL/min 103.26    Latest Reference Range & Units 12/01/21 14:26  Total CHOL/HDL Ratio  4  Cholesterol 0 - 200 mg/dL 148  HDL Cholesterol >39.00 mg/dL 35.50 (L)  LDL (calc) 0 - 99 mg/dL 93  MICROALB/CREAT RATIO 0.0 - 30.0 mg/g 0.7  NonHDL  112.57  Triglycerides 0.0 - 149.0 mg/dL 100.0  VLDL 0.0 - 40.0 mg/dL 20.0    Latest Reference Range & Units 12/01/21 14:26  Creatinine,U mg/dL 131.4  Microalb, Ur 0.0 - 1.9 mg/dL 1.0  MICROALB/CREAT RATIO  0.0 - 30.0 mg/g 0.7       CT 12/27/2017 Adrenals/Urinary Tract: 3.0 cm right adrenal adenoma, grossly unchanged. Left adrenal gland is within normal limits.      ASSESSMENT / PLAN / RECOMMENDATIONS:   1) Type 2 Diabetes Mellitus, Poorly controlled, Without complications - Most recent A1c of 8.9  %. Goal A1c < 7.0 %.   - Poorly controlled diabetes due to medication non adherence.  She is depending  on her basal rate  and doesn't  take prandial insulin as much  - In office BG 166 mg/dL  but this is because she had Humalog 10 units  withOUT eating. She ate Breakfast withOUT taking Humalog  - I explained to the pt that I believe her insulin doses would work well if she takes them . I have encouraged her to take prandial insulin WITH a meal not an hour before or after  - I will refer her to our educator for proper education  - She did not like the dexom , it caused her stress. She declines the freestyle libre  - She declines insulin pump  - She developed pancreatitis to DPP-4 inhibitors per chart . GLP-1 agonists and DPP-4 inhibitors are contra-indicated  - Intolerant to SGLT-2 inhibitors due to recurrent genital infections   - I have offered Piolitazone , but she would like to work on improving humalog intake  - pt to notify me if she is unable to have insulin and will switch to ReliON isulin    MEDICATIONS: - Continue  Metformin 500 mg XR 2 tablet with breakfast and 1 tablet with Supper - Tresiba and increase  82 units BID  - HUmalog 5 units with Breakfast snack,10 units with Lunch and  12 units with Supper    EDUCATION / INSTRUCTIONS: BG monitoring instructions: Patient is instructed to check her blood sugars 3 times a day, before meals . Call [facility] if: BG persistently < 70 I reviewed the Rule of 15 for the treatment of hypoglycemia in detail with the patient. Literature supplied.   2) Diabetic complications:  Eye: Does not have known diabetic retinopathy.   Neuro/ Feet: Does not have known diabetic peripheral neuropathy. Renal: Patient does not have known baseline CKD. She is not on an ACEI/ARB at present.     4) Right adrenal adenoma:     - Eighty five percent of adrenal adenomas are nonsecretory.  - Size slightly increased from 2.8 to 3 cm over a 2 yr period - Will consider repeat imaging for stability  - Aldo: renin and metanephrines are negative  - Her 24-hr urinary cortisol at 44 ug/L per text book this is normal but per [facility] this is slightly above goal of 42 ug/L.  - Will repeat .      5) Dyslipidemia :  - LDL increased from 52 to 109 mg/dL . She has Atorvastatin listed on her allergy list with hair loss . She has been on Pravastatin as well   - I will recommend rosuvastatin 5 mg daily   Medication  Rosuvastatin 5 mg daily   F/U in 4 months    Signed electronically by: Mack Guise, MD  [facility]  [facility] [address] [place], [place] [address] Phone: [phone] FAX: [phone]   CC: Hali Marry, [place] [address] Phone: [phone]  Fax: [phone]  Return to Endocrinology clinic as below: Future Appointments  Date Time Provider [place]  12/01/2021  2:00 PM Setareh Rom, Melanie Crazier, MD [facility] None

## 2021-12-02 ENCOUNTER — Other Ambulatory Visit: Payer: Self-pay | Admitting: Pulmonary Disease

## 2021-12-02 DIAGNOSIS — R059 Cough, unspecified: Secondary | ICD-10-CM

## 2021-12-02 MED ORDER — ROSUVASTATIN CALCIUM 5 MG PO TABS: 5.0000 mg | ORAL_TABLET | Freq: Every day | ORAL | 3 refills | Status: DC

## 2021-12-03 ENCOUNTER — Other Ambulatory Visit (HOSPITAL_COMMUNITY): Payer: Self-pay

## 2021-12-03 ENCOUNTER — Telehealth: Payer: Self-pay | Admitting: Pharmacy Technician

## 2021-12-03 NOTE — Telephone Encounter (Signed)
Patient states that she has pick up her Tyler Aas and that she will get the Crestor

## 2021-12-04 MED ORDER — MOMETASONE FURO-FORMOTEROL FUM 200-5 MCG/ACT IN AERO: 2.0000 | INHALATION_SPRAY | Freq: Two times a day (BID) | RESPIRATORY_TRACT | 1 refills | Status: DC

## 2022-01-30 ENCOUNTER — Other Ambulatory Visit: Payer: Self-pay | Admitting: Family Medicine

## 2022-02-27 ENCOUNTER — Other Ambulatory Visit: Payer: Self-pay | Admitting: Family Medicine

## 2022-02-27 DIAGNOSIS — Z1231 Encounter for screening mammogram for malignant neoplasm of breast: Secondary | ICD-10-CM

## 2022-03-04 ENCOUNTER — Other Ambulatory Visit: Payer: Self-pay | Admitting: Family Medicine

## 2022-03-13 ENCOUNTER — Encounter: Payer: Managed Care, Other (non HMO) | Admitting: Family Medicine

## 2022-03-23 ENCOUNTER — Telehealth: Payer: Self-pay

## 2022-03-23 ENCOUNTER — Encounter: Payer: Self-pay | Admitting: Family Medicine

## 2022-03-23 ENCOUNTER — Ambulatory Visit (INDEPENDENT_AMBULATORY_CARE_PROVIDER_SITE_OTHER): Payer: BC Managed Care – PPO | Admitting: Family Medicine

## 2022-03-23 VITALS — BP 138/67 | HR 93 | Ht 61.0 in | Wt 187.0 lb

## 2022-03-23 DIAGNOSIS — Z23 Encounter for immunization: Secondary | ICD-10-CM | POA: Diagnosis not present

## 2022-03-23 DIAGNOSIS — Z0000 Encounter for general adult medical examination without abnormal findings: Secondary | ICD-10-CM

## 2022-03-23 DIAGNOSIS — Z1211 Encounter for screening for malignant neoplasm of colon: Secondary | ICD-10-CM

## 2022-03-23 MED ORDER — OLMESARTAN MEDOXOMIL 5 MG PO TABS: 5.0000 mg | ORAL_TABLET | Freq: Every day | ORAL | 0 refills | Status: DC

## 2022-03-23 NOTE — Progress Notes (Signed)
? ?  Established Patient Office Visit ? ?Subjective   ?Patient ID: Terri Zimmerman, female    DOB: Dec 13, 1965  Age: 56 y.o. MRN: [id number] ? ?No chief complaint on file. ? ? ?HPI ? ? ? ?ROS ? ?  ?Objective:  ?  ? ?There were no vitals taken for this visit. ? ? ?Physical Exam ? ? ?No results found for any visits on 03/23/22. ? ? ? ?The 10-year ASCVD risk score (Arnett DK, et al., 2019) is: 15.8% ? ?  ?Assessment & Plan:  ? ?Problem List Items Addressed This Visit   ?None ? ? ?No follow-ups on file.  ? ? ?Beatrice Lecher, MD ? ?

## 2022-03-23 NOTE — Telephone Encounter (Signed)
She told me the valsartan caused nausea so I sent in olmesartan instead. So please take hte olmelsartan and NOT the valsartan.  ?

## 2022-03-23 NOTE — Telephone Encounter (Signed)
Patient called stating she is taking Valsartan and not Crestor. [place] updated/corrected. Patient would like to know if she is suppose to take Valsartan and Olmesartan together? Forward to Dr. Madilyn Fireman.  ?

## 2022-03-23 NOTE — Progress Notes (Signed)
? ?Complete physical exam ? ?Patient: Terri Zimmerman   DOB: 1966/06/06   56 y.o. Female  MRN: [id number] ? ?Subjective:  ?  ?Chief Complaint  ?Patient presents with  ? Annual Exam  ? ? ?Terri Zimmerman is a 56 y.o. female who presents today for a complete physical exam. She reports consuming a general diet. She generally feels well. . She does not have additional problems to discuss today.  ? ? ?Most recent fall risk assessment: ? ?  03/23/2022  ?  8:05 AM  ?Fall Risk   ?Falls in the past year? 1  ?Number falls in past yr: 0  ?Injury with Fall? 1  ?Risk for fall due to : No Fall Risks  ?Follow up Falls prevention discussed  ? ?  ?Most recent depression screenings: ? ?  03/23/2022  ?  8:04 AM 10/28/2020  ?  7:42 AM  ?PHQ 2/9 Scores  ?PHQ - 2 Score 0 0  ?PHQ- 9 Score 1   ? ? ? ? ?Past Surgical History:  ?Procedure Laterality Date  ? ABDOMINAL HYSTERECTOMY  10/2010  ? Complete  ? APPENDECTOMY  07/1999  ? BREAST BIOPSY Left 02/1999  ? BREAST LUMPECTOMY WITH AXILLARY LYMPH NODE DISSECTION Left 10/2010  ? FOOT TENDON SURGERY  05/16/2012  ? bone spur.    ? LAPAROSCOPY  02/2007  ? for cyst and endometriosis  ? ?Social History  ? ?Tobacco Use  ? Smoking status: Every Day  ?  Packs/day: 0.50  ?  Types: Cigarettes  ? Smokeless tobacco: Never  ?Vaping Use  ? Vaping Use: Never used  ?Substance Use Topics  ? Alcohol use: No  ? Drug use: No  ? ?Family History  ?Problem Relation Age of Onset  ? Other Mother   ?     RA and CHF  ? Diabetes Father   ? Diabetes Other   ? Heart disease Other   ? ?  ? ?Patient Care Team: ?Hali Marry, MD as PCP - General  ? ?Outpatient Medications Prior to Visit  ?Medication Sig  ? acetaminophen (TYLENOL) 325 MG tablet Take 650 mg by mouth 2 (two) times daily.  ? albuterol (VENTOLIN HFA) 108 (90 Base) MCG/ACT inhaler TAKE 2 PUFFS BY MOUTH EVERY 6 HOURS AS NEEDED  ? AMBULATORY NON FORMULARY MEDICATION 1 each by Other route 2 (two) times daily. Lancets for daily glucose checks Diagnosis: E11.9 Testing 2  times a day  ? fluticasone (FLONASE) 50 MCG/ACT nasal spray Place 2 sprays into both nostrils daily.  ? glucose blood (ONE TOUCH ULTRA TEST) test strip USE TO TEST BLOOD SUGARS 2 TIMES A DAY. DX:E11.8  ? insulin degludec (TRESIBA FLEXTOUCH) 200 UNIT/ML FlexTouch Pen Inject 82 Units into the skin in the morning and at bedtime.  ? insulin lispro (HUMALOG KWIKPEN) 100 UNIT/ML KwikPen Inject 5 Units into the skin daily with breakfast AND 10 Units daily with lunch AND 12 Units daily with supper.  ? Insulin Pen Needle (B-D UF III MINI PEN NEEDLES) 31G X 5 MM MISC Inject 1 Device into the skin in the morning, at noon, in the evening, and at bedtime.  ? metFORMIN (GLUCOPHAGE-XR) 500 MG 24 hr tablet Take 2 tablets (1,000 mg total) by mouth daily with breakfast AND 1 tablet (500 mg total) daily with supper.  ? metoprolol succinate (TOPROL-XL) 25 MG 24 hr tablet TAKE 1 TABLET (25 MG TOTAL) BY MOUTH AT BEDTIME. NEEDS APPOINTMENT FOR FURTHER REFILLS  ? mometasone-formoterol (DULERA) 200-5 MCG/ACT  AERO Inhale 2 puffs into the lungs in the morning and at bedtime.  ? Multiple Vitamin (MULTIVITAMIN) tablet Take 1 tablet by mouth daily.  ? OneTouch Delica Lancets 16X MISC UAD AS DIRECTED  ? rosuvastatin (CRESTOR) 5 MG tablet Take 1 tablet (5 mg total) by mouth daily.  ? [DISCONTINUED] valsartan (DIOVAN) 160 MG tablet Take 1 tablet (160 mg total) by mouth daily.  ? ?No facility-administered medications prior to visit.  ? ? ?ROS ? ? ? ? ?   ?Objective:  ? ?  ?BP 138/67   Pulse 93   Ht '5\' 1"'$  (1.549 m)   Wt 187 lb (84.8 kg)   SpO2 95%   BMI 35.33 kg/m?  ? ? ?Physical Exam ?Vitals and nursing note reviewed.  ?Constitutional:   ?   Appearance: She is well-developed.  ?HENT:  ?   Head: Normocephalic and atraumatic.  ?   Right Ear: External ear normal.  ?   Left Ear: External ear normal.  ?   Nose: Nose normal.  ?Eyes:  ?   Conjunctiva/sclera: Conjunctivae normal.  ?   Pupils: Pupils are equal, round, and reactive to light.  ?Neck:  ?    Thyroid: No thyromegaly.  ?Cardiovascular:  ?   Rate and Rhythm: Normal rate and regular rhythm.  ?   Heart sounds: Normal heart sounds.  ?Pulmonary:  ?   Effort: Pulmonary effort is normal.  ?   Breath sounds: Normal breath sounds. No wheezing.  ?Abdominal:  ?   General: Bowel sounds are normal.  ?   Palpations: Abdomen is soft.  ?Musculoskeletal:  ?   Cervical back: Neck supple.  ?Lymphadenopathy:  ?   Cervical: No cervical adenopathy.  ?Skin: ?   General: Skin is warm and dry.  ?Neurological:  ?   Mental Status: She is alert and oriented to person, place, and time.  ?Psychiatric:     ?   Behavior: Behavior normal.  ?  ? ?No results found for any visits on 03/23/22. ?Last metabolic panel ?Lab Results  ?Component Value Date  ? GLUCOSE 111 (H) 12/01/2021  ? NA 142 12/01/2021  ? K 3.5 12/01/2021  ? CL 104 12/01/2021  ? CO2 28 12/01/2021  ? BUN 6 12/01/2021  ? CREATININE 0.54 12/01/2021  ? GFRNONAA 102 05/16/2021  ? CALCIUM 9.1 12/01/2021  ? PROT 6.4 04/23/2021  ? ALBUMIN 3.9 06/10/2019  ? BILITOT 0.3 04/23/2021  ? ALKPHOS 101 06/10/2019  ? AST 14 04/23/2021  ? ALT 14 04/23/2021  ? ANIONGAP 12 06/10/2019  ? ?Last lipids ?Lab Results  ?Component Value Date  ? CHOL 148 12/01/2021  ? HDL 35.50 (L) 12/01/2021  ? [place] 93 12/01/2021  ? TRIG 100.0 12/01/2021  ? CHOLHDL 4 12/01/2021  ? ?  ?   ?Assessment & Plan:  ?  ?Routine Health Maintenance and Physical Exam ? ?Immunization History  ?Administered Date(s) Administered  ? Influenza Inj Mdck Quad Pf 08/30/2021  ? Influenza Split 09/29/2011, 08/09/2012  ? Influenza Whole 09/17/2007, 08/19/2010  ? Influenza, Quadrivalent, Recombinant, Inj, Pf 09/12/2013, 09/20/2014, 09/27/2015, 08/20/2016, 07/20/2017, 07/27/2018  ? Influenza,inj,Quad PF,6+ Mos 09/12/2013, 09/20/2014, 09/27/2015, 08/20/2016, 07/20/2017, 07/27/2018, 08/25/2019, 10/28/2020  ? Janssen (J&J) SARS-COV-2 Vaccination 02/23/2020  ? Pneumococcal Polysaccharide-23 10/16/2006, 06/23/2013, 08/16/2013  ? Td 10/16/2006  ?  Tdap 11/30/2016  ? Zoster Recombinat (Shingrix) 03/23/2022  ? ? ?Health Maintenance  ?Topic Date Due  ? Hepatitis C Screening  Never done  ? Pneumococcal Vaccine 68-34 Years old (2 - PCV)  08/16/2014  ? COVID-19 Vaccine (2 - Booster for Janssen series) 04/19/2020  ? FOOT EXAM  12/10/2020  ? Zoster Vaccines- Shingrix (2 of 2) 05/18/2022  ? HEMOGLOBIN A1C  05/31/2022  ? Fecal DNA (Cologuard)  06/07/2022  ? INFLUENZA VACCINE  06/16/2022  ? OPHTHALMOLOGY EXAM  09/30/2022  ? URINE MICROALBUMIN  12/01/2022  ? MAMMOGRAM  09/25/2023  ? TETANUS/TDAP  11/30/2026  ? HIV Screening  Completed  ? HPV VACCINES  Aged Out  ? ? ?Discussed health benefits of physical activity, and encouraged her to engage in regular exercise appropriate for her age and condition. ? ?Problem List Items Addressed This Visit   ?None ?Visit Diagnoses   ? ? Wellness examination    -  Primary  ? Relevant Orders  ? Varicella-zoster vaccine IM (Shingrix) (Completed)  ? Need for Zostavax administration      ? Relevant Orders  ? Varicella-zoster vaccine IM (Shingrix) (Completed)  ? Screening for malignant neoplasm of colon      ? Relevant Orders  ? Cologuard  ? ?  ? ?Keep up a regular exercise program and make sure you are eating a healthy diet ?Try to eat 4 servings of dairy a day, or if you are lactose intolerant take a calcium with vitamin D daily.  ?Your vaccines are up to date.  Shingles vaccine given today.  She is also due for pneumonia vaccine but we discussed separating them out.  She can follow-up in 4 to 6 months for second shingles vaccine. ?Repeat blood pressure was little bit better today. ?Colon cancer screening discussed.  Cologuard ordered. ? ? ?Return in about 4 months (around 07/24/2022) for 2nd shingles vaccine. ? ?  ? ?Beatrice Lecher, MD ? ? ?

## 2022-03-24 MED ORDER — PITAVASTATIN CALCIUM 2 MG PO TABS: 2.0000 mg | ORAL_TABLET | Freq: Every day | ORAL | 3 refills | Status: DC

## 2022-03-24 NOTE — Addendum Note (Signed)
Addended by: Izora Gala on: 03/24/2022 01:45 PM ? ? Modules accepted: Orders ? ?

## 2022-03-24 NOTE — Addendum Note (Signed)
Addended by: Beatrice Lecher D on: 03/24/2022 12:42 PM ? ? Modules accepted: Orders ? ?

## 2022-03-24 NOTE — Telephone Encounter (Signed)
Okay, new medication sent.  Please correct her allergy list. ? ?Meds ordered this encounter  ?Medications  ? Pitavastatin Calcium 2 MG TABS  ?  Sig: Take 1 tablet (2 mg total) by mouth at bedtime.  ?  Dispense:  90 tablet  ?  Refill:  3  ? ? ?

## 2022-03-24 NOTE — Telephone Encounter (Signed)
Patient advised of message. Patient stated it was the Crestor that was causing her to have nausea and not the Valsartan. Patient stated she has stopped the Crestor and will continue to take the Valsartan. Patient would like another medication for her cholesterol sent to the pharmacy.  ?

## 2022-03-24 NOTE — Telephone Encounter (Signed)
Patient advised of message. Allergy and medlist updated.  ?

## 2022-03-27 ENCOUNTER — Encounter: Payer: Self-pay | Admitting: Family Medicine

## 2022-03-27 ENCOUNTER — Telehealth: Payer: Self-pay

## 2022-03-27 NOTE — Telephone Encounter (Addendum)
Initiated Prior authorization [id number] '2MG'$  tablets Via: Covermymeds Case/Key: [id number] Status: approved  as of 03/27/22 Reason:Start Date:03/25/2022;Coverage End Date:04/07/2023 Notified Pt via: Mychart

## 2022-03-30 ENCOUNTER — Other Ambulatory Visit: Payer: Self-pay | Admitting: Family Medicine

## 2022-03-31 ENCOUNTER — Ambulatory Visit: Payer: Managed Care, Other (non HMO) | Admitting: Internal Medicine

## 2022-03-31 NOTE — Progress Notes (Deleted)
Name: Terri Zimmerman  Age/ Sex: 56 y.o., female   MRN/ DOB: [id number], [birth year]     PCP: Hali Marry, MD   Reason for Endocrinology Evaluation: Type 2 Diabetes Mellitus  Initial Endocrine Consultative Visit: 12/31/2020    PATIENT IDENTIFIER: Terri Zimmerman is a 56 y.o. female with a past medical history of T2DM,HTN, Hx of Pancreatitis  . The patient has followed with Endocrinology clinic since 12/31/2020 for consultative assistance with management of her diabetes.     DIABETIC HISTORY:  Terri Zimmerman was diagnosed with DM  In 2009, Ozempic/ Victoza Abdominal pain/diarrhea, Invokana - back pain. Onglyza- Pancreatitis. Farxiga - genital infections. Has been on insulin since her diagnosis.  Her hemoglobin A1c has ranged from 6.8 % in 2017, peaking at 9.4% in 2021.  On her initial visit to our clinic she had an A1c of 9.3% . She was on Metformin and basaglar, she also had a prescription of prandial insulin but was not taking. We attempted a small dose of Farxiga but this has to be discontinued due to genital infection.    Dexcom was stressful      ADRENAL HISTORY: She had an incidental finding of a right adrenal adenoma on abdominal CT 2017 measuring 2.8 cm during evaluation of abdominal pain.  Pheo has been ruled out with normal metanephrines and nor metanephrines , aldo: renin normal ( 12/2020) She had elevated 24-hr urinary cortisol at 44 ( reference for [facility] 42) . We opted to repeat following improving glucose control.      SUBJECTIVE:   During the last visit (12/01/2021): A1c 8.9% , we continued  metformin and adjusted MDI regimen     Today (03/31/2022): Terri Zimmerman is here for a follow up on diabetes management.  She checks her blood sugars occasionally . The patient has not had hypoglycemic episodes since the last clinic visit.  She had her annual physical through her PCP on 03/23/2022  Declines pump and CGM    HOME DIABETES REGIMEN:  Metformin 500 mg XR 2 tabs  with Breakfast and 1 tab with Supper Tresiba  82 units BID  Humalog 5 units with Breakfast Snack,  10 units with lunch and 12 units with supper      Statin: yes ACE-I/ARB: yes Prior Diabetic Education: yes    DIABETIC COMPLICATIONS: Microvascular complications:   Denies: CKD, retinopathy, neuropathy Last Eye Exam: Completed 2022  Macrovascular complications:   Denies: CAD, CVA, PVD   HISTORY:  Past Medical History:  Past Medical History:  Diagnosis Date   Asthma 04-25-07   Diabetes mellitus 11-12-06   type 2   Hypertension 11-04-06   Past Surgical History:  Past Surgical History:  Procedure Laterality Date   ABDOMINAL HYSTERECTOMY  10/2010   Complete   APPENDECTOMY  07/1999   BREAST BIOPSY Left 02/1999   BREAST LUMPECTOMY WITH AXILLARY LYMPH NODE DISSECTION Left 10/2010   FOOT TENDON SURGERY  05/16/2012   bone spur.     LAPAROSCOPY  02/2007   for cyst and endometriosis   Social History:  reports that she has been smoking cigarettes. She has been smoking an average of .5 packs per day. She has never used smokeless tobacco. She reports that she does not drink alcohol and does not use drugs. Family History:  Family History  Problem Relation Age of Onset   Other Mother        RA and CHF   Diabetes Father    Diabetes Other  Heart disease Other      HOME MEDICATIONS: Allergies as of 03/31/2022       Reactions   Trulicity [dulaglutide] Nausea Only   Abdominal pain,   Crestor [rosuvastatin] Nausea Only   Jentadueto [linagliptin-metformin Hcl Er]    Abdominal pains and diarrhea   Lipitor [atorvastatin]    Sudden hair loss 2 weeks after starting lipitor.    Onglyza [saxagliptin] Other (See Comments)   Pancreatitis   Ozempic (0.25 Or 0.5 Mg-dose) [semaglutide(0.25 Or 0.'5mg'$ -dos)] Diarrhea, Other (See Comments)   Abdominal pain   Valsartan Other (See Comments)   Severe nausea   Victoza [liraglutide] Other (See Comments)   Abdominal pain and nausea.          Medication List        Accurate as of Mar 31, 2022  6:47 AM. If you have any questions, ask your nurse or doctor.          acetaminophen 325 MG tablet Commonly known as: TYLENOL Take 650 mg by mouth 2 (two) times daily.   albuterol 108 (90 Base) MCG/ACT inhaler Commonly known as: VENTOLIN HFA TAKE 2 PUFFS BY MOUTH EVERY 6 HOURS AS NEEDED   AMBULATORY NON FORMULARY MEDICATION 1 each by Other route 2 (two) times daily. Lancets for daily glucose checks Diagnosis: E11.9 Testing 2 times a day   B-D UF III MINI PEN NEEDLES 31G X 5 MM Misc Generic drug: Insulin Pen Needle Inject 1 Device into the skin in the morning, at noon, in the evening, and at bedtime.   fluticasone 50 MCG/ACT nasal spray Commonly known as: Flonase Place 2 sprays into both nostrils daily.   glucose blood test strip Commonly known as: ONE TOUCH ULTRA TEST USE TO TEST BLOOD SUGARS 2 TIMES A DAY. DX:E11.8   insulin lispro 100 UNIT/ML KwikPen Commonly known as: HumaLOG KwikPen Inject 5 Units into the skin daily with breakfast AND 10 Units daily with lunch AND 12 Units daily with supper.   metFORMIN 500 MG 24 hr tablet Commonly known as: GLUCOPHAGE-XR Take 2 tablets (1,000 mg total) by mouth daily with breakfast AND 1 tablet (500 mg total) daily with supper.   metoprolol succinate 25 MG 24 hr tablet Commonly known as: TOPROL-XL TAKE 1 TABLET (25 MG TOTAL) BY MOUTH AT BEDTIME. NEEDS APPOINTMENT FOR FURTHER REFILLS   mometasone-formoterol 200-5 MCG/ACT Aero Commonly known as: DULERA Inhale 2 puffs into the lungs in the morning and at bedtime.   multivitamin tablet Take 1 tablet by mouth daily.   OneTouch Delica Lancets 65L Misc UAD AS DIRECTED   Pitavastatin Calcium 2 MG Tabs Take 1 tablet (2 mg total) by mouth at bedtime.   Tyler Aas FlexTouch 200 UNIT/ML FlexTouch Pen Generic drug: insulin degludec Inject 82 Units into the skin in the morning and at bedtime.   valsartan 160 MG  tablet Commonly known as: DIOVAN Take 160 mg by mouth daily.         OBJECTIVE:   Vital Signs: There were no vitals taken for this visit.  Wt Readings from Last 3 Encounters:  03/23/22 187 lb (84.8 kg)  12/01/21 188 lb 9.6 oz (85.5 kg)  07/22/21 192 lb (87.1 kg)     Exam: General: Pt appears well and is in NAD  Lungs: Clear with good BS bilat with no rales, rhonchi, or wheezes  Heart: RRR    Extremities: Mild right knee swelling noted, but no erythema or warmth, No pretibial edema. .  Neuro: MS is good with  appropriate affect, pt is alert and Ox3    DM foot exam: 04/15/2021  The skin of the feet is intact without sores or ulcerations. The pedal pulses are 2+ on right and 2+ on left. The sensation is intact to a screening 5.07, 10 gram monofilament bilaterally   DATA REVIEWED:  Lab Results  Component Value Date   HGBA1C 8.9 (A) 12/01/2021   HGBA1C 8.2 (A) 07/22/2021   HGBA1C 9.6 (A) 04/15/2021    Latest Reference Range & Units 12/01/21 14:26  Sodium 135 - 145 mEq/L 142  Potassium 3.5 - 5.1 mEq/L 3.5  Chloride 96 - 112 mEq/L 104  CO2 19 - 32 mEq/L 28  Glucose 70 - 99 mg/dL 111 (H)  BUN 6 - 23 mg/dL 6  Creatinine 0.40 - 1.20 mg/dL 0.54  Calcium 8.4 - 10.5 mg/dL 9.1  GFR >60.00 mL/min 103.26    Latest Reference Range & Units 12/01/21 14:26  Total CHOL/HDL Ratio  4  Cholesterol 0 - 200 mg/dL 148  HDL Cholesterol >39.00 mg/dL 35.50 (L)  LDL (calc) 0 - 99 mg/dL 93  MICROALB/CREAT RATIO 0.0 - 30.0 mg/g 0.7  NonHDL  112.57  Triglycerides 0.0 - 149.0 mg/dL 100.0  VLDL 0.0 - 40.0 mg/dL 20.0    Latest Reference Range & Units 12/01/21 14:26  Creatinine,U mg/dL 131.4  Microalb, Ur 0.0 - 1.9 mg/dL 1.0  MICROALB/CREAT RATIO 0.0 - 30.0 mg/g 0.7       CT 05/24/2021  Upper Abdomen: Visualized upper abdomen demonstrates a 3.5 cm right adrenal lesion consistent with adenoma and stable in appearance from a prior CT from 06/10/2019. Left adrenal gland is within  normal limits. The remainder of the upper abdomen is unremarkable.     ASSESSMENT / PLAN / RECOMMENDATIONS:   1) Type 2 Diabetes Mellitus, Poorly controlled, Without complications - Most recent A1c of 8.9  %. Goal A1c < 7.0 %.   - She did not like the dexom , it caused her stress. She declines the freestyle libre  - She declines insulin pump  - She developed pancreatitis to DPP-4 inhibitors per chart . GLP-1 agonists and DPP-4 inhibitors are contra-indicated  - Intolerant to SGLT-2 inhibitors due to recurrent genital infections   - I have offered Piolitazone , but she would like to work on improving humalog intake  - pt to notify me if she is unable to have insulin and will switch to ReliON isulin    MEDICATIONS: - Continue  Metformin 500 mg XR 2 tablet with breakfast and 1 tablet with Supper - Tresiba and increase  82 units BID  - HUmalog 5 units with Breakfast snack,10 units with Lunch and 12 units with Supper    EDUCATION / INSTRUCTIONS: BG monitoring instructions: Patient is instructed to check her blood sugars 3 times a day, before meals . Call [facility] if: BG persistently < 70 I reviewed the Rule of 15 for the treatment of hypoglycemia in detail with the patient. Literature supplied.   2) Diabetic complications:  Eye: Does not have known diabetic retinopathy.  Neuro/ Feet: Does not have known diabetic peripheral neuropathy. Renal: Patient does not have known baseline CKD. She is not on an ACEI/ARB at present.     4) Right adrenal adenoma:     - Eighty five percent of adrenal adenomas are nonsecretory.  - Size slightly increased from 2.8 to 3 cm over a 2 yr period - Will consider repeat imaging for stability  - Aldo: renin and  metanephrines are negative  - Her 24-hr urinary cortisol at 44 ug/L per text book this is normal but per [facility] this is slightly above goal of 42 ug/L.  - Will repeat .      5) Dyslipidemia :  - LDL increased from 52  to 109 mg/dL . She has Atorvastatin listed on her allergy list with hair loss . She has been on Pravastatin as well   - I will recommend rosuvastatin 5 mg daily   Medication  Rosuvastatin 5 mg daily   F/U in 4 months    Signed electronically by: Mack Guise, MD  [facility]  [facility] [address] [place], [place] [address] Phone: [phone] FAX: [phone]   CC: Hali Marry, [address] [place] [address] Phone: [phone]  Fax: [phone]  Return to Endocrinology clinic as below: Future Appointments  Date Time Provider [place]  03/31/2022  8:10 AM Saree Krogh, Melanie Crazier, MD [facility] None  10/01/2022  8:40 AM [facility]- MM 1 [facility]

## 2022-04-16 ENCOUNTER — Telehealth: Payer: Self-pay | Admitting: Internal Medicine

## 2022-04-16 DIAGNOSIS — Z794 Long term (current) use of insulin: Secondary | ICD-10-CM

## 2022-04-16 DIAGNOSIS — R7989 Other specified abnormal findings of blood chemistry: Secondary | ICD-10-CM

## 2022-04-16 NOTE — Telephone Encounter (Signed)
Patient called to request a referral to a new endocrinologist that is closer to her.  She is requesting referral to Dr Steffanie Dunn with [facility]) fax # [phone]

## 2022-04-20 NOTE — Telephone Encounter (Signed)
Patient now informed that referral has been placed

## 2022-04-21 ENCOUNTER — Encounter: Payer: Self-pay | Admitting: Urgent Care

## 2022-04-21 ENCOUNTER — Emergency Department (INDEPENDENT_AMBULATORY_CARE_PROVIDER_SITE_OTHER)
Admission: EM | Admit: 2022-04-21 | Discharge: 2022-04-21 | Disposition: A | Discharge status: Home / Self Care | Payer: BC Managed Care – PPO | Source: Home / Self Care

## 2022-04-21 ENCOUNTER — Emergency Department (INDEPENDENT_AMBULATORY_CARE_PROVIDER_SITE_OTHER): Payer: BC Managed Care – PPO

## 2022-04-21 DIAGNOSIS — F17200 Nicotine dependence, unspecified, uncomplicated: Secondary | ICD-10-CM | POA: Diagnosis not present

## 2022-04-21 DIAGNOSIS — R059 Cough, unspecified: Secondary | ICD-10-CM | POA: Diagnosis not present

## 2022-04-21 DIAGNOSIS — R0981 Nasal congestion: Secondary | ICD-10-CM

## 2022-04-21 DIAGNOSIS — J449 Chronic obstructive pulmonary disease, unspecified: Secondary | ICD-10-CM | POA: Diagnosis not present

## 2022-04-21 DIAGNOSIS — R0602 Shortness of breath: Secondary | ICD-10-CM

## 2022-04-21 DIAGNOSIS — J3489 Other specified disorders of nose and nasal sinuses: Secondary | ICD-10-CM | POA: Diagnosis not present

## 2022-04-21 DIAGNOSIS — R0989 Other specified symptoms and signs involving the circulatory and respiratory systems: Secondary | ICD-10-CM

## 2022-04-21 DIAGNOSIS — J441 Chronic obstructive pulmonary disease with (acute) exacerbation: Secondary | ICD-10-CM | POA: Diagnosis not present

## 2022-04-21 DIAGNOSIS — R093 Abnormal sputum: Secondary | ICD-10-CM

## 2022-04-21 LAB — POC SARS CORONAVIRUS 2 AG -  ED: SARS Coronavirus 2 Ag: NEGATIVE

## 2022-04-21 MED ORDER — AMOXICILLIN-POT CLAVULANATE 875-125 MG PO TABS: 1.0000 | ORAL_TABLET | Freq: Two times a day (BID) | ORAL | 0 refills | Status: AC

## 2022-04-21 MED ORDER — PROMETHAZINE-DM 6.25-15 MG/5ML PO SYRP: 5.0000 mL | ORAL_SOLUTION | Freq: Every evening | ORAL | 0 refills | Status: DC | PRN

## 2022-04-21 MED ORDER — PREDNISONE 50 MG PO TABS: 50.0000 mg | ORAL_TABLET | Freq: Every day | ORAL | 0 refills | Status: AC

## 2022-04-21 MED ORDER — AZELASTINE HCL 137 MCG/SPRAY NA SOLN: 2.0000 | Freq: Two times a day (BID) | NASAL | 0 refills | Status: DC

## 2022-04-21 MED ORDER — GUAIFENESIN ER 600 MG PO TB12: 600.0000 mg | ORAL_TABLET | Freq: Two times a day (BID) | ORAL | 0 refills | Status: DC | PRN

## 2022-04-21 NOTE — ED Provider Notes (Signed)
Terri Zimmerman CARE    CSN: [id number] Arrival date & time: 04/21/22  1555      History   Chief Complaint Chief Complaint  Patient presents with   Headache   Sore Throat   Shortness of Breath    HPI Terri Zimmerman is a 56 y.o. female.   56yo female with known hx of COPD presents today on her 7th day of cough, shortness of breath, headache and sore throat. She reportedly was improving on Saturday, but then Sunday had a downward spiral. She has not been able to taste or smell over the past few days. Has been using numerous OTC medications without relief. Denies sinus pain but reports lots of nasal congestion that is radiating to her ears. Endorses post nasal drainage and throat irritation. Her cough is very deep and productive of brown sputum. She feels weak and achy. Was exposed to 2yo grandson who attends daycare. Pt does smoke and has hx of COPD. Takes Dulera and PRN albuterol which has not been effective. Has known hx of HTN. Takes her medications in the morning, did not take them yet today. Denies pulsatile tinnitus, CP, palps, dizziness. Saw PCP recently in which it was more controlled than today. States she has been "sucking down sudafed."    Headache Sore Throat Associated symptoms include headaches and shortness of breath.  Shortness of Breath Associated symptoms: headaches    Past Medical History:  Diagnosis Date   Asthma 04-25-07   Diabetes mellitus 11-12-06   type 2   Hypertension 11-04-06    Patient Active Problem List   Diagnosis Date Noted   Primary osteoarthritis of right knee 07/30/2021   Aortic atherosclerosis ([place]) 05/26/2021   Left upper quadrant pain 05/16/2021   History of COVID-19 05/16/2021   Tobacco dependence 05/16/2021   Recurrent non-productive cough 05/09/2021   Lymphocytosis 05/09/2021   Abnormal QT interval present on electrocardiogram 05/09/2021   Left leg swelling 04/24/2021   Irregular heart rhythm 04/23/2021   Cough, persistent  04/23/2021   SOB (shortness of breath) 04/23/2021   Polyarthralgia 04/15/2021   Adrenal adenoma, right 04/15/2021   Trigger finger of left thumb and right ring finger 03/19/2021   Right foot pain 03/19/2021   Elevated cortisol level 01/13/2021   Type 2 diabetes mellitus with hyperglycemia, with long-term current use of insulin ([place]) 12/31/2020   Stress at home 10/28/2020   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   Class 2 obesity due to excess calories without serious comorbidity with body mass index (BMI) of 36.0 to 36.9 in adult 01/22/2020   Low back pain radiating to both legs 10/27/2019   PVC's (premature ventricular contractions) 04/25/2019   Hematuria 04/18/2019   Primary osteoarthritis of right hip 04/18/2019   OSA (obstructive sleep apnea) 09/02/2018   NAFL (nonalcoholic fatty liver) 82/50/5397   De Quervain's tenosynovitis, left 02/20/2016   Patchy loss of hair 03/02/2015   Tobacco abuse 05/25/2014   AMENORRHEA 04/17/2010   Hereditary and idiopathic peripheral neuropathy 10/22/2009   Essential hypertension 11/29/2008   PALPITATIONS, OCCASIONAL 04/12/2008   Diabetes mellitus type 2 with complications ([place]) 67/34[birth year]1937   VITAMIN D DEFICIENCY 01/10/2008   ALLERGIC RHINITIS 01/10/2008   ASTHMA, UNSPECIFIED, UNSPECIFIED STATUS 01/10/2008    Past Surgical History:  Procedure Laterality Date   ABDOMINAL HYSTERECTOMY  10/2010   Complete   APPENDECTOMY  07/1999   BREAST BIOPSY Left 02/1999   BREAST LUMPECTOMY WITH AXILLARY LYMPH NODE DISSECTION Left 10/2010   FOOT TENDON SURGERY  05/16/2012  bone spur.     LAPAROSCOPY  02/2007   for cyst and endometriosis    OB History   No obstetric history on file.      Home Medications    Prior to Admission medications   Medication Sig Start Date End Date Taking? Authorizing Provider  amoxicillin-clavulanate (AUGMENTIN) 875-125 MG tablet Take 1 tablet by mouth every 12 (twelve) hours for 7 days. 04/21/22 04/28/22 Yes Zia Najera L,  PA  Azelastine HCl 137 MCG/SPRAY SOLN Place 2 sprays into the nose 2 (two) times daily. 04/21/22  Yes Maico Mulvehill L, PA  guaiFENesin (MUCINEX) 600 MG 12 hr tablet Take 1 tablet (600 mg total) by mouth 2 (two) times daily as needed for cough or to loosen phlegm. 04/21/22  Yes Jachin Coury L, PA  predniSONE (DELTASONE) 50 MG tablet Take 1 tablet (50 mg total) by mouth daily with breakfast for 6 days. 04/21/22 04/27/22 Yes Tyerra Loretto L, PA  promethazine-dextromethorphan (PROMETHAZINE-DM) 6.25-15 MG/5ML syrup Take 5 mLs by mouth at bedtime as needed for cough. 04/21/22  Yes Neisha Hinger L, PA  acetaminophen (TYLENOL) 325 MG tablet Take 650 mg by mouth 2 (two) times daily.    [provider]  albuterol (VENTOLIN HFA) 108 (90 Base) MCG/ACT inhaler TAKE 2 PUFFS BY MOUTH EVERY 6 HOURS AS NEEDED 06/09/21   Hali Marry, MD  AMBULATORY NON FORMULARY MEDICATION 1 each by Other route 2 (two) times daily. Lancets for daily glucose checks Diagnosis: E11.9 Testing 2 times a day 01/19/19   Hali Marry, MD  fluticasone [facility] 50 MCG/ACT nasal spray Place 2 sprays into both nostrils daily. 07/07/21 07/07/22  Caleen Jobs B, NP  glucose blood (ONE TOUCH ULTRA TEST) test strip USE TO TEST BLOOD SUGARS 2 TIMES A DAY. DX:E11.8 12/14/19   Hali Marry, MD  insulin degludec (TRESIBA FLEXTOUCH) 200 UNIT/ML FlexTouch Pen Inject 82 Units into the skin in the morning and at bedtime. 12/01/21   Shamleffer, Melanie Crazier, MD  insulin lispro (HUMALOG KWIKPEN) 100 UNIT/ML KwikPen Inject 5 Units into the skin daily with breakfast AND 10 Units daily with lunch AND 12 Units daily with supper. 12/01/21   Shamleffer, Melanie Crazier, MD  Insulin Pen Needle (B-D UF III MINI PEN NEEDLES) 31G X 5 MM MISC Inject 1 Device into the skin in the morning, at noon, in the evening, and at bedtime. 12/01/21   Shamleffer, Melanie Crazier, MD  metFORMIN (GLUCOPHAGE-XR) 500 MG 24 hr tablet Take 2 tablets (1,000 mg  total) by mouth daily with breakfast AND 1 tablet (500 mg total) daily with supper. 12/01/21   Shamleffer, Melanie Crazier, MD  metoprolol succinate (TOPROL-XL) 25 MG 24 hr tablet Take 1 tablet (25 mg total) by mouth at bedtime. 03/31/22   Hali Marry, MD  mometasone-formoterol (DULERA) 200-5 MCG/ACT AERO Inhale 2 puffs into the lungs in the morning and at bedtime. 12/04/21   Freddi Starr, MD  Multiple Vitamin (MULTIVITAMIN) tablet Take 1 tablet by mouth daily.    [provider]  OneTouch Delica Lancets 54S MISC UAD AS DIRECTED 12/13/19   [provider]  Pitavastatin Calcium 2 MG TABS Take 1 tablet (2 mg total) by mouth at bedtime. 03/24/22   Hali Marry, MD  valsartan (DIOVAN) 160 MG tablet Take 160 mg by mouth daily.    [provider]    Family History Family History  Problem Relation Age of Onset   Other Mother  RA and CHF   Diabetes Father    Diabetes Other    Heart disease Other     Social History Social History   Tobacco Use   Smoking status: Every Day    Packs/day: 0.50    Types: Cigarettes   Smokeless tobacco: Never  Vaping Use   Vaping Use: Never used  Substance Use Topics   Alcohol use: No   Drug use: No     Allergies   Trulicity [dulaglutide], Crestor [rosuvastatin], Jentadueto [linagliptin-metformin hcl er], Lipitor [atorvastatin], Onglyza [saxagliptin], Ozempic (0.25 or 0.5 mg-dose) [semaglutide(0.25 or 0.'5mg'$ -dos)], Valsartan, and Victoza [liraglutide]   Review of Systems Review of Systems  Respiratory:  Positive for shortness of breath.   Neurological:  Positive for headaches.  As per hpi  Physical Exam Triage Vital Signs ED Triage Vitals  Enc Vitals Group     BP 04/21/22 1606 (!) 182/84     Pulse Rate 04/21/22 1606 (!) 105     Resp 04/21/22 1606 18     Temp 04/21/22 1606 98.3 F (36.8 C)     Temp Source 04/21/22 1606 Oral     SpO2 04/21/22 1606 96 %     Weight --      Height --      Head  Circumference --      Peak Flow --      Pain Score 04/21/22 1609 0     Pain Loc --      Pain Edu? --      Excl. in [place]? --    No data found.  Updated Vital Signs BP (!) 182/84 (BP Location: Right Arm)   Pulse (!) 105   Temp 98.3 F (36.8 C) (Oral)   Resp 18   SpO2 96%   Visual Acuity Right Eye Distance:   Left Eye Distance:   Bilateral Distance:    Right Eye Near:   Left Eye Near:    Bilateral Near:     Physical Exam Vitals and nursing note reviewed.  Constitutional:      General: She is not in acute distress.    Appearance: She is well-developed. She is obese. She is ill-appearing. She is not toxic-appearing or diaphoretic.  HENT:     Head: Normocephalic and atraumatic.     Right Ear: Tympanic membrane, ear canal and external ear normal. No middle ear effusion. Tympanic membrane is not erythematous.     Left Ear: Tympanic membrane, ear canal and external ear normal.  No middle ear effusion. Tympanic membrane is not erythematous.     Nose: Congestion and rhinorrhea present. Rhinorrhea is purulent.     Right Turbinates: Enlarged and swollen.     Left Turbinates: Enlarged and swollen.     Right Sinus: No maxillary sinus tenderness or frontal sinus tenderness.     Left Sinus: No maxillary sinus tenderness or frontal sinus tenderness.     Mouth/Throat:     Mouth: Mucous membranes are moist.     Pharynx: Oropharynx is clear. No pharyngeal swelling or oropharyngeal exudate.  Eyes:     Extraocular Movements: Extraocular movements intact.     Pupils: Pupils are equal, round, and reactive to light.  Cardiovascular:     Rate and Rhythm: Regular rhythm. Tachycardia present. No extrasystoles are present.    Pulses: Normal pulses.     Heart sounds: No murmur heard. Pulmonary:     Effort: Pulmonary effort is normal. No tachypnea or accessory muscle usage.     Breath sounds:  Examination of the right-upper field reveals decreased breath sounds and rhonchi. Examination of the  left-upper field reveals decreased breath sounds and rhonchi. Examination of the right-middle field reveals decreased breath sounds and rhonchi. Examination of the left-middle field reveals decreased breath sounds and rhonchi. Decreased breath sounds and rhonchi present. No wheezing or rales.  Chest:     Chest wall: No mass, deformity, tenderness or crepitus.  Musculoskeletal:     Cervical back: Normal range of motion and neck supple.     Right lower leg: No edema.     Left lower leg: No edema.  Lymphadenopathy:     Cervical: No cervical adenopathy.  Skin:    General: Skin is warm.     Capillary Refill: Capillary refill takes less than 2 seconds.     Coloration: Skin is not cyanotic.     Findings: No erythema.     Nails: There is no clubbing.  Neurological:     General: No focal deficit present.     Mental Status: She is alert.     UC Treatments / Results  Labs (all labs ordered are listed, but only abnormal results are displayed) Labs Reviewed  POC SARS CORONAVIRUS 2 AG -  ED    EKG   Radiology DG Chest 2 View  Result Date: 04/21/2022 CLINICAL DATA:  Cough and congestion. Brown sputum. Shortness of breath. Smoker. COPD. EXAM: CHEST - 2 VIEW COMPARISON:  04/23/2021, CT 05/24/2021 FINDINGS: The cardiomediastinal contours are normal. Chronic bronchial thickening with mild hyperinflation. Subsegmental scarring in the lingula. Pulmonary vasculature is normal. No consolidation, pleural effusion, or pneumothorax. No acute osseous abnormalities are seen. IMPRESSION: Chronic bronchial thickening with mild hyperinflation. No acute chest findings. Electronically Signed   By: Keith Rake M.D.   On: 04/21/2022 16:44    Procedures Procedures (including critical care time)  Medications Ordered in UC Medications - No data to display  Initial Impression / Assessment and Plan / UC Course  I have reviewed the triage vital signs and the nursing notes.  Pertinent labs & imaging results  that were available during my care of the patient were reviewed by me and considered in my medical decision making (see chart for details).     COPD exacerbation - CXR negative for pneumonia. Covid test negative. Will start augmentin and prednisone for COPD exacerbation. Pt admits that prednisone has no negative effect on her glucose "and actually improved them last time I took it." Must stop all OTC meds that can elevate BP. Change to mucinex during the day, promethazine cough medication only at night to help sleep. Additional supportive remedies encouraged Nasal congestion - stop afrin. Start azelastine instead to help dry out nasal secretion. HTN - elevated. Likely secondary to excessive OTC medications, illness and harsh coughing, and pt not having taken her medications today. Must monitor, f/u with PCP.  Final Clinical Impressions(s) / UC Diagnoses   Final diagnoses:  COPD exacerbation ([place])  Nasal congestion with rhinorrhea     Discharge Instructions      Your chest xray does not indicate pneumonia. There is chronic bronchial thickening, which indicates you are having a COPD exacerbation. Your covid test is negative. Please start taking the antibiotics prescribed today. Take the prednisone first thing in the morning. STOP taking anything OTC that contains phenylenephrine or sudafed. You can take PLAIN MUCINEX '600mg'$  twice daily.  PLEASE monitor your BP and contact your PCP if it remains elevated.  Stop the Afrin, I have called in an  alternative nasal spray. Please follow up with your PCP within 14 days. Return to clinic if any symptoms worsen.     ED Prescriptions     Medication Sig Dispense Auth. Provider   amoxicillin-clavulanate (AUGMENTIN) 875-125 MG tablet Take 1 tablet by mouth every 12 (twelve) hours for 7 days. 14 tablet Sayge Salvato L, PA   predniSONE (DELTASONE) 50 MG tablet Take 1 tablet (50 mg total) by mouth daily with breakfast for 6 days. 6 tablet Tamieka Rancourt,  Francies Inch L, PA   Azelastine HCl 137 MCG/SPRAY SOLN Place 2 sprays into the nose 2 (two) times daily. 30 mL Gus Littler L, PA   guaiFENesin (MUCINEX) 600 MG 12 hr tablet Take 1 tablet (600 mg total) by mouth 2 (two) times daily as needed for cough or to loosen phlegm. 20 tablet Navy Belay L, PA   promethazine-dextromethorphan (PROMETHAZINE-DM) 6.25-15 MG/5ML syrup Take 5 mLs by mouth at bedtime as needed for cough. 60 mL Stephfon Bovey L, PA      PDMP not reviewed this encounter.   Chaney Malling, [place] 04/21/22 1707

## 2022-04-21 NOTE — Discharge Instructions (Addendum)
Your chest xray does not indicate pneumonia. There is chronic bronchial thickening, which indicates you are having a COPD exacerbation. Your covid test is negative. Please start taking the antibiotics prescribed today. Take the prednisone first thing in the morning. STOP taking anything OTC that contains phenylenephrine or sudafed. You can take PLAIN MUCINEX '600mg'$  twice daily.  PLEASE monitor your BP and contact your PCP if it remains elevated.  Stop the Afrin, I have called in an alternative nasal spray. Please follow up with your PCP within 14 days. Return to clinic if any symptoms worsen.

## 2022-04-21 NOTE — ED Triage Notes (Signed)
Pt c/o headache, sore throat and shortness of breathe since last Tues. Lots of congestion, facial pain. Hx of bronchitis. Inhaler prn. Also taking robitussin, Mucinex, motrin, saline prn.

## 2022-05-01 ENCOUNTER — Other Ambulatory Visit: Payer: Self-pay | Admitting: Family Medicine

## 2022-05-01 ENCOUNTER — Other Ambulatory Visit: Payer: Self-pay | Admitting: *Deleted

## 2022-05-01 DIAGNOSIS — R053 Chronic cough: Secondary | ICD-10-CM

## 2022-05-08 DIAGNOSIS — Z794 Long term (current) use of insulin: Secondary | ICD-10-CM | POA: Diagnosis not present

## 2022-05-08 DIAGNOSIS — E1165 Type 2 diabetes mellitus with hyperglycemia: Secondary | ICD-10-CM | POA: Diagnosis not present

## 2022-05-08 LAB — HEMOGLOBIN A1C: Hemoglobin A1C: 9.8

## 2022-06-09 DIAGNOSIS — Z1211 Encounter for screening for malignant neoplasm of colon: Secondary | ICD-10-CM | POA: Diagnosis not present

## 2022-06-16 LAB — COLOGUARD: COLOGUARD: POSITIVE — AB

## 2022-06-17 NOTE — Progress Notes (Signed)
HI Terri Zimmerman, you Cologuard is positive.  We will need ot refer you to GI for a full colonoscopy.  Do you have a preference for provider or location?

## 2022-06-19 ENCOUNTER — Encounter (HOSPITAL_BASED_OUTPATIENT_CLINIC_OR_DEPARTMENT_OTHER): Payer: Self-pay

## 2022-06-19 ENCOUNTER — Other Ambulatory Visit: Payer: Self-pay

## 2022-06-19 ENCOUNTER — Emergency Department (HOSPITAL_BASED_OUTPATIENT_CLINIC_OR_DEPARTMENT_OTHER): Payer: BC Managed Care – PPO

## 2022-06-19 ENCOUNTER — Emergency Department (HOSPITAL_BASED_OUTPATIENT_CLINIC_OR_DEPARTMENT_OTHER)
Admission: EM | Admit: 2022-06-19 | Discharge: 2022-06-19 | Disposition: A | Discharge status: Home / Self Care | Payer: BC Managed Care – PPO | Attending: Emergency Medicine | Admitting: Emergency Medicine

## 2022-06-19 DIAGNOSIS — N201 Calculus of ureter: Secondary | ICD-10-CM | POA: Diagnosis not present

## 2022-06-19 DIAGNOSIS — N132 Hydronephrosis with renal and ureteral calculous obstruction: Secondary | ICD-10-CM | POA: Diagnosis not present

## 2022-06-19 DIAGNOSIS — R0600 Dyspnea, unspecified: Secondary | ICD-10-CM | POA: Diagnosis not present

## 2022-06-19 DIAGNOSIS — R059 Cough, unspecified: Secondary | ICD-10-CM | POA: Insufficient documentation

## 2022-06-19 DIAGNOSIS — R739 Hyperglycemia, unspecified: Secondary | ICD-10-CM | POA: Diagnosis not present

## 2022-06-19 DIAGNOSIS — K859 Acute pancreatitis without necrosis or infection, unspecified: Secondary | ICD-10-CM | POA: Diagnosis not present

## 2022-06-19 DIAGNOSIS — R0602 Shortness of breath: Secondary | ICD-10-CM | POA: Insufficient documentation

## 2022-06-19 DIAGNOSIS — N3289 Other specified disorders of bladder: Secondary | ICD-10-CM | POA: Diagnosis not present

## 2022-06-19 DIAGNOSIS — R42 Dizziness and giddiness: Secondary | ICD-10-CM | POA: Diagnosis not present

## 2022-06-19 DIAGNOSIS — I10 Essential (primary) hypertension: Secondary | ICD-10-CM | POA: Diagnosis not present

## 2022-06-19 DIAGNOSIS — E1165 Type 2 diabetes mellitus with hyperglycemia: Secondary | ICD-10-CM | POA: Diagnosis not present

## 2022-06-19 DIAGNOSIS — R079 Chest pain, unspecified: Secondary | ICD-10-CM | POA: Insufficient documentation

## 2022-06-19 DIAGNOSIS — R195 Other fecal abnormalities: Secondary | ICD-10-CM

## 2022-06-19 DIAGNOSIS — Z20822 Contact with and (suspected) exposure to covid-19: Secondary | ICD-10-CM | POA: Insufficient documentation

## 2022-06-19 DIAGNOSIS — M549 Dorsalgia, unspecified: Secondary | ICD-10-CM | POA: Diagnosis not present

## 2022-06-19 LAB — CBG MONITORING, ED
Glucose-Capillary: 399 mg/dL — ABNORMAL HIGH (ref 70–99)
Glucose-Capillary: 151 mg/dL — ABNORMAL HIGH (ref 70–99)

## 2022-06-19 LAB — URINALYSIS, ROUTINE W REFLEX MICROSCOPIC
Bilirubin Urine: NEGATIVE
Glucose, UA: >=500 mg/dL — AB
Hgb urine dipstick: NEGATIVE
Ketones, ur: NEGATIVE mg/dL
Leukocytes,Ua: NEGATIVE
Nitrite: NEGATIVE
Protein, ur: NEGATIVE mg/dL
Specific Gravity, Urine: 1.015 (ref 1.005–1.030)
pH: 6.5 (ref 5.0–8.0)

## 2022-06-19 LAB — BASIC METABOLIC PANEL
Anion gap: 8 (ref 5–15)
BUN: 9 mg/dL (ref 6–20)
CO2: 26 mmol/L (ref 22–32)
Calcium: 8.8 mg/dL — ABNORMAL LOW (ref 8.9–10.3)
Chloride: 104 mmol/L (ref 98–111)
Creatinine, Ser: 0.84 mg/dL (ref 0.44–1.00)
GFR, Estimated: >60 mL/min (ref >60)
Glucose, Bld: 396 mg/dL — ABNORMAL HIGH (ref 70–99)
Potassium: 3.8 mmol/L (ref 3.5–5.1)
Sodium: 138 mmol/L (ref 135–145)

## 2022-06-19 LAB — CBC
HCT: 39.4 % (ref 36.0–46.0)
Hemoglobin: 12.6 g/dL (ref 12.0–15.0)
MCH: 27.2 pg (ref 26.0–34.0)
MCHC: 32 g/dL (ref 30.0–36.0)
MCV: 85.1 fL (ref 80.0–100.0)
Platelets: 243 10*3/uL (ref 150–400)
RBC: 4.63 MIL/uL (ref 3.87–5.11)
RDW: 13.3 % (ref 11.5–15.5)
WBC: 10.3 10*3/uL (ref 4.0–10.5)
nRBC: 0 % (ref 0.0–0.2)

## 2022-06-19 LAB — HEPATIC FUNCTION PANEL
ALT: 18 U/L (ref 0–44)
AST: 22 U/L (ref 15–41)
Albumin: 3.3 g/dL — ABNORMAL LOW (ref 3.5–5.0)
Alkaline Phosphatase: 75 U/L (ref 38–126)
Bilirubin, Direct: 0.1 mg/dL (ref 0.0–0.2)
Indirect Bilirubin: 0.4 mg/dL (ref 0.3–0.9)
Total Bilirubin: 0.5 mg/dL (ref 0.3–1.2)
Total Protein: 6.5 g/dL (ref 6.5–8.1)

## 2022-06-19 LAB — D-DIMER, QUANTITATIVE: D-Dimer, Quant: 0.55 ug/mL-FEU — ABNORMAL HIGH (ref 0.00–0.50)

## 2022-06-19 LAB — URINALYSIS, MICROSCOPIC (REFLEX)

## 2022-06-19 LAB — TROPONIN I (HIGH SENSITIVITY)
Troponin I (High Sensitivity): 3 ng/L (ref <18)
Troponin I (High Sensitivity): 4 ng/L (ref <18)

## 2022-06-19 LAB — LIPASE, BLOOD: Lipase: 122 U/L — ABNORMAL HIGH (ref 11–51)

## 2022-06-19 LAB — SARS CORONAVIRUS 2 BY RT PCR: SARS Coronavirus 2 by RT PCR: NEGATIVE

## 2022-06-19 MED ORDER — IOHEXOL 350 MG/ML SOLN
100.0000 mL | Freq: Once | INTRAVENOUS | Status: AC | PRN
Start: 2022-06-19 — End: 2022-06-19
  Administered 2022-06-19: 100 mL via INTRAVENOUS

## 2022-06-19 MED ORDER — AEROCHAMBER PLUS FLO-VU MEDIUM MISC
1.0000 | Freq: Once | Status: AC
Start: 2022-06-19 — End: 2022-06-19
  Administered 2022-06-19: 1
  Filled 2022-06-19: qty 1

## 2022-06-19 MED ORDER — INSULIN ASPART 100 UNIT/ML IJ SOLN
10.0000 [IU] | Freq: Once | INTRAMUSCULAR | Status: AC
  Administered 2022-06-19: 10 [IU] via SUBCUTANEOUS

## 2022-06-19 MED ORDER — ONDANSETRON 4 MG PO TBDP: 4.0000 mg | ORAL_TABLET | Freq: Three times a day (TID) | ORAL | 0 refills | Status: DC | PRN

## 2022-06-19 MED ORDER — IPRATROPIUM-ALBUTEROL 0.5-2.5 (3) MG/3ML IN SOLN
3.0000 mL | Freq: Once | RESPIRATORY_TRACT | Status: AC
Start: 2022-06-19 — End: 2022-06-19
  Administered 2022-06-19: 3 mL via RESPIRATORY_TRACT
  Filled 2022-06-19: qty 3

## 2022-06-19 MED ORDER — LACTATED RINGERS IV BOLUS
500.0000 mL | Freq: Once | INTRAVENOUS | Status: AC
  Administered 2022-06-19: 500 mL via INTRAVENOUS

## 2022-06-19 MED ORDER — HYDROCODONE-ACETAMINOPHEN 5-325 MG PO TABS: 1.0000 | ORAL_TABLET | Freq: Four times a day (QID) | ORAL | 0 refills | Status: DC | PRN

## 2022-06-19 MED ORDER — ALBUTEROL SULFATE HFA 108 (90 BASE) MCG/ACT IN AERS
2.0000 | INHALATION_SPRAY | Freq: Once | RESPIRATORY_TRACT | Status: AC
  Administered 2022-06-19: 2 via RESPIRATORY_TRACT
  Filled 2022-06-19: qty 6.7

## 2022-06-19 NOTE — ED Notes (Signed)
Patient ambulated with pulse ox. SAT 97-98%. Slight SOB noted, but stated she feels ok other than back pain. RT to monitor as needed

## 2022-06-19 NOTE — Progress Notes (Signed)
GI referral for colonoscopy per result note.

## 2022-06-19 NOTE — Discharge Instructions (Addendum)
You can take two puffs of albuterol every 4 hours as needed for shortness of breath.   Please do not take your metformin for the next two days.  If you are taking your insulins and your sugars are still over 250s it is ok to take the metformin.  Please make sure you are drinking plenty of water and staying well hydrated.   For the next two days please try to follow a clear liquid diet.  After that you can start introducing foods on the pancreatitis eating plan.    If your pain is uncontrolled, you have fevers or other new concerns please seek additional care and evaluation.

## 2022-06-19 NOTE — ED Notes (Signed)
ED Provider at bedside. 

## 2022-06-19 NOTE — ED Notes (Signed)
Patient transported to CT 

## 2022-06-19 NOTE — ED Provider Notes (Signed)
[facility] EMERGENCY DEPARTMENT Provider Note   CSN: [id number] Arrival date & time: 06/19/22  1449     History  Chief Complaint  Patient presents with   Shortness of Breath    Terri Zimmerman is a 56 y.o. female who presents today for evaluation of shortness of breath with chest pain from her lower chest/upper abdomen radiating into her back.  She states that this started today after lunch and has been gradually worsening.  She notes she did not take her 10 units of insulin at lunch today due to her pain.  She states that her pain worsened her right around lunchtime.  She notes she ate roast beef for lunch.  She does report she started coughing yesterday.  She feels like her chest pain is worse when she breathes.  She denies any abnormal leg swelling.  No fevers.  No known sick contacts.  HPI     Home Medications Prior to Admission medications   Medication Sig Start Date End Date Taking? Authorizing Provider  HYDROcodone-acetaminophen (NORCO/VICODIN) 5-325 MG tablet Take 1 tablet by mouth every 6 (six) hours as needed for severe pain. 06/19/22  Yes Lorin Glass, PA-C  ondansetron (ZOFRAN-ODT) 4 MG disintegrating tablet Take 1 tablet (4 mg total) by mouth every 8 (eight) hours as needed for nausea or vomiting. 06/19/22  Yes Lorin Glass, PA-C  acetaminophen (TYLENOL) 325 MG tablet Take 650 mg by mouth 2 (two) times daily.    [provider]  albuterol (VENTOLIN HFA) 108 (90 Base) MCG/ACT inhaler TAKE 2 PUFFS BY MOUTH EVERY 6 HOURS AS NEEDED 06/09/21   Hali Marry, MD  AMBULATORY NON FORMULARY MEDICATION 1 each by Other route 2 (two) times daily. Lancets for daily glucose checks Diagnosis: E11.9 Testing 2 times a day 01/19/19   Hali Marry, MD  Azelastine HCl 137 MCG/SPRAY SOLN Place 2 sprays into the nose 2 (two) times daily. 04/21/22   Crain, Whitney L, PA  fluticasone (FLONASE) 50 MCG/ACT nasal spray Place 2 sprays into both nostrils  daily. 07/07/21 07/07/22  Caleen Jobs B, NP  glucose blood (ONE TOUCH ULTRA TEST) test strip USE TO TEST BLOOD SUGARS 2 TIMES A DAY. DX:E11.8 12/14/19   Hali Marry, MD  guaiFENesin (MUCINEX) 600 MG 12 hr tablet Take 1 tablet (600 mg total) by mouth 2 (two) times daily as needed for cough or to loosen phlegm. 04/21/22   Crain, Whitney L, PA  insulin degludec (TRESIBA FLEXTOUCH) 200 UNIT/ML FlexTouch Pen Inject 82 Units into the skin in the morning and at bedtime. 12/01/21   Shamleffer, Melanie Crazier, MD  insulin lispro (HUMALOG KWIKPEN) 100 UNIT/ML KwikPen Inject 5 Units into the skin daily with breakfast AND 10 Units daily with lunch AND 12 Units daily with supper. 12/01/21   Shamleffer, Melanie Crazier, MD  Insulin Pen Needle (B-D UF III MINI PEN NEEDLES) 31G X 5 MM MISC Inject 1 Device into the skin in the morning, at noon, in the evening, and at bedtime. 12/01/21   Shamleffer, Melanie Crazier, MD  metFORMIN (GLUCOPHAGE-XR) 500 MG 24 hr tablet Take 2 tablets (1,000 mg total) by mouth daily with breakfast AND 1 tablet (500 mg total) daily with supper. 12/01/21   Shamleffer, Melanie Crazier, MD  metoprolol succinate (TOPROL-XL) 25 MG 24 hr tablet Take 1 tablet (25 mg total) by mouth at bedtime. 03/31/22   Hali Marry, MD  mometasone-formoterol (DULERA) 200-5 MCG/ACT AERO Inhale 2 puffs into the lungs in  the morning and at bedtime. 12/04/21   Freddi Starr, MD  Multiple Vitamin (MULTIVITAMIN) tablet Take 1 tablet by mouth daily.    [provider]  OneTouch Delica Lancets 16R MISC UAD AS DIRECTED 12/13/19   [provider]  Pitavastatin Calcium 2 MG TABS Take 1 tablet (2 mg total) by mouth at bedtime. 03/24/22   Hali Marry, MD  promethazine-dextromethorphan (PROMETHAZINE-DM) 6.25-15 MG/5ML syrup Take 5 mLs by mouth at bedtime as needed for cough. 04/21/22   Crain, Whitney L, PA  valsartan (DIOVAN) 160 MG tablet TAKE 1 TABLET BY MOUTH EVERY DAY 05/01/22   Hali Marry, MD      Allergies    Trulicity [dulaglutide], Crestor [rosuvastatin], Jentadueto [linagliptin-metformin hcl er], Lipitor [atorvastatin], Onglyza [saxagliptin], Ozempic (0.25 or 0.5 mg-dose) [semaglutide(0.25 or 0.'5mg'$ -dos)], Valsartan, and Victoza [liraglutide]    Review of Systems   Review of Systems  Physical Exam Updated Vital Signs BP (!) 154/67   Pulse 88   Temp 97.9 F (36.6 C) (Oral)   Resp 16   Ht '5\' 1"'$  (1.549 m)   Wt 84.4 kg   SpO2 97%   BMI 35.14 kg/m  Physical Exam Vitals and nursing note reviewed.  Constitutional:      General: She is not in acute distress.    Appearance: She is not ill-appearing.  HENT:     Head: Atraumatic.  Eyes:     Conjunctiva/sclera: Conjunctivae normal.  Cardiovascular:     Rate and Rhythm: Normal rate and regular rhythm.     Pulses:          Radial pulses are 2+ on the right side and 2+ on the left side.       Posterior tibial pulses are 2+ on the right side and 2+ on the left side.  Pulmonary:     Effort: Pulmonary effort is normal. No respiratory distress.     Breath sounds: Rhonchi (Mild, diffuse) present.     Comments: Wet sounding productive cough Chest:     Chest wall: No tenderness.  Abdominal:     General: There is no distension.     Palpations: Abdomen is soft.     Tenderness: There is abdominal tenderness in the epigastric area.  Musculoskeletal:     Cervical back: Normal range of motion and neck supple.     Right lower leg: No tenderness. No edema.     Left lower leg: No tenderness. No edema.     Comments: No obvious acute injury  Skin:    General: Skin is warm.  Neurological:     Mental Status: She is alert.     Comments: Awake and alert, answers all questions appropriately.  Speech is not slurred.  Psychiatric:        Mood and Affect: Mood normal.        Behavior: Behavior normal.     ED Results / Procedures / Treatments   Labs (all labs ordered are listed, but only abnormal results are  displayed) Labs Reviewed  BASIC METABOLIC PANEL - Abnormal; Notable for the following components:      Result Value   Glucose, Bld 396 (*)    Calcium 8.8 (*)    All other components within normal limits  HEPATIC FUNCTION PANEL - Abnormal; Notable for the following components:   Albumin 3.3 (*)    All other components within normal limits  LIPASE, BLOOD - Abnormal; Notable for the following components:   Lipase 122 (*)  All other components within normal limits  D-DIMER, QUANTITATIVE - Abnormal; Notable for the following components:   D-Dimer, Quant 0.55 (*)    All other components within normal limits  URINALYSIS, ROUTINE W REFLEX MICROSCOPIC - Abnormal; Notable for the following components:   Glucose, UA >=500 (*)    All other components within normal limits  URINALYSIS, MICROSCOPIC (REFLEX) - Abnormal; Notable for the following components:   Bacteria, UA RARE (*)    All other components within normal limits  CBG MONITORING, ED - Abnormal; Notable for the following components:   Glucose-Capillary 399 (*)    All other components within normal limits  CBG MONITORING, ED - Abnormal; Notable for the following components:   Glucose-Capillary 151 (*)    All other components within normal limits  SARS CORONAVIRUS 2 BY RT PCR  URINE CULTURE  CBC  TROPONIN I (HIGH SENSITIVITY)  TROPONIN I (HIGH SENSITIVITY)    EKG EKG Interpretation  Date/Time:  Friday June 19 2022 14:59:09 EDT Ventricular Rate:  92 PR Interval:  167 QRS Duration: 87 QT Interval:  353 QTC Calculation: 437 R Axis:   -70 Text Interpretation: Sinus rhythm Left anterior fascicular block Anterior infarct, old Confirmed by Octaviano Glow [phone]) on 06/19/2022 3:43:29 PM  Radiology CT Angio Chest PE W and/or Wo Contrast  Result Date: 06/19/2022 CLINICAL DATA:  Chest and abdominal pain, initial encounter EXAM: CT ANGIOGRAPHY CHEST CT ABDOMEN AND PELVIS WITH CONTRAST TECHNIQUE: Multidetector CT imaging of the chest  was performed using the standard protocol during bolus administration of intravenous contrast. Multiplanar CT image reconstructions and MIPs were obtained to evaluate the vascular anatomy. Multidetector CT imaging of the abdomen and pelvis was performed using the standard protocol during bolus administration of intravenous contrast. RADIATION DOSE REDUCTION: This exam was performed according to the departmental dose-optimization program which includes automated exposure control, adjustment of the mA and/or kV according to patient size and/or use of iterative reconstruction technique. CONTRAST:  117m OMNIPAQUE IOHEXOL 350 MG/ML SOLN COMPARISON:  Chest x-ray from earlier in the same day. FINDINGS: CTA CHEST FINDINGS Cardiovascular: Thoracic aorta demonstrates mild atherosclerotic calcifications. No aneurysmal dilatation or dissection is seen. No cardiac enlargement is noted. No coronary calcifications are noted. The pulmonary artery shows a normal branching pattern bilaterally. No filling defect to suggest pulmonary embolism is noted. Mediastinum/Nodes: Thoracic inlet is within normal limits. No hilar or mediastinal adenopathy is noted. The esophagus is within normal limits. Lungs/Pleura: Lungs are well aerated bilaterally. No focal infiltrate or sizable effusion is seen. No parenchymal nodules are seen. Musculoskeletal: No acute rib abnormality is noted. Mild degenerative changes of the thoracic spine are seen. No compression deformity is noted. Review of the MIP images confirms the above findings. CT ABDOMEN and PELVIS FINDINGS Hepatobiliary: Gallbladder is within normal limits. Scattered small hypodensities are seen stable from previous exams consistent with small cysts. Pancreas: Unremarkable. No pancreatic ductal dilatation or surrounding inflammatory changes. Spleen: Normal in size without focal abnormality. Adrenals/Urinary Tract: Well visualized. Stable 3.6 cm right adrenal lesion is noted being back to 2020.  This is most consistent with an adenoma. Left adrenal gland is within normal limits. Kidneys demonstrate no renal calculi. Mild fullness of the left collecting system and left ureter is noted secondary to a 4 mm stone at the left UVJ. The bladder is decompressed. No obstructive changes on the right are noted. Stomach/Bowel: No obstructive or inflammatory changes of the colon are seen. The appendix has been surgically removed. Small bowel and stomach  are unremarkable. Vascular/Lymphatic: Aortic atherosclerosis. No enlarged abdominal or pelvic lymph nodes. Reproductive: Status post hysterectomy. No adnexal masses. Other: No abdominal wall hernia or abnormality. No abdominopelvic ascites. Musculoskeletal: Degenerative changes of lumbar spine are noted. Review of the MIP images confirms the above findings. IMPRESSION: CTA of the chest: No evidence of aortic dissection or pulmonary embolism. No acute abnormality noted. CT of the abdomen and pelvis: 4 mm distal left ureteral stone with hydronephrosis and hydroureter. Stable 3.6 cm right adrenal lesion consistent with adenoma since 2020. No follow-up is recommended. Electronically Signed   By: Inez Catalina M.D.   On: 06/19/2022 17:47   CT ABDOMEN PELVIS W CONTRAST  Result Date: 06/19/2022 CLINICAL DATA:  Chest and abdominal pain, initial encounter EXAM: CT ANGIOGRAPHY CHEST CT ABDOMEN AND PELVIS WITH CONTRAST TECHNIQUE: Multidetector CT imaging of the chest was performed using the standard protocol during bolus administration of intravenous contrast. Multiplanar CT image reconstructions and MIPs were obtained to evaluate the vascular anatomy. Multidetector CT imaging of the abdomen and pelvis was performed using the standard protocol during bolus administration of intravenous contrast. RADIATION DOSE REDUCTION: This exam was performed according to the departmental dose-optimization program which includes automated exposure control, adjustment of the mA and/or kV  according to patient size and/or use of iterative reconstruction technique. CONTRAST:  185m OMNIPAQUE IOHEXOL 350 MG/ML SOLN COMPARISON:  Chest x-ray from earlier in the same day. FINDINGS: CTA CHEST FINDINGS Cardiovascular: Thoracic aorta demonstrates mild atherosclerotic calcifications. No aneurysmal dilatation or dissection is seen. No cardiac enlargement is noted. No coronary calcifications are noted. The pulmonary artery shows a normal branching pattern bilaterally. No filling defect to suggest pulmonary embolism is noted. Mediastinum/Nodes: Thoracic inlet is within normal limits. No hilar or mediastinal adenopathy is noted. The esophagus is within normal limits. Lungs/Pleura: Lungs are well aerated bilaterally. No focal infiltrate or sizable effusion is seen. No parenchymal nodules are seen. Musculoskeletal: No acute rib abnormality is noted. Mild degenerative changes of the thoracic spine are seen. No compression deformity is noted. Review of the MIP images confirms the above findings. CT ABDOMEN and PELVIS FINDINGS Hepatobiliary: Gallbladder is within normal limits. Scattered small hypodensities are seen stable from previous exams consistent with small cysts. Pancreas: Unremarkable. No pancreatic ductal dilatation or surrounding inflammatory changes. Spleen: Normal in size without focal abnormality. Adrenals/Urinary Tract: Well visualized. Stable 3.6 cm right adrenal lesion is noted being back to 2020. This is most consistent with an adenoma. Left adrenal gland is within normal limits. Kidneys demonstrate no renal calculi. Mild fullness of the left collecting system and left ureter is noted secondary to a 4 mm stone at the left UVJ. The bladder is decompressed. No obstructive changes on the right are noted. Stomach/Bowel: No obstructive or inflammatory changes of the colon are seen. The appendix has been surgically removed. Small bowel and stomach are unremarkable. Vascular/Lymphatic: Aortic  atherosclerosis. No enlarged abdominal or pelvic lymph nodes. Reproductive: Status post hysterectomy. No adnexal masses. Other: No abdominal wall hernia or abnormality. No abdominopelvic ascites. Musculoskeletal: Degenerative changes of lumbar spine are noted. Review of the MIP images confirms the above findings. IMPRESSION: CTA of the chest: No evidence of aortic dissection or pulmonary embolism. No acute abnormality noted. CT of the abdomen and pelvis: 4 mm distal left ureteral stone with hydronephrosis and hydroureter. Stable 3.6 cm right adrenal lesion consistent with adenoma since 2020. No follow-up is recommended. Electronically Signed   By: MInez CatalinaM.D.   On: 06/19/2022 17:47  DG Chest Port 1 View  Result Date: 06/19/2022 CLINICAL DATA:  Constant dyspnea this morning and dizziness EXAM: PORTABLE CHEST 1 VIEW COMPARISON:  Radiographs 04/21/2022 FINDINGS: No focal consolidation, pleural effusion, or pneumothorax. Mild chronic bronchial thickening. Bibasilar scarring/atelectasis. Normal cardiomediastinal silhouette. No acute osseous abnormality. IMPRESSION: Chronic bronchial thickening.  No acute chest findings. Electronically Signed   By: Placido Sou M.D.   On: 06/19/2022 15:10    Procedures Procedures    Medications Ordered in ED Medications  insulin aspart (novoLOG) injection 10 Units (10 Units Subcutaneous Given 06/19/22 1602)  lactated ringers bolus 500 mL (0 mLs Intravenous Stopped 06/19/22 1753)  iohexol (OMNIPAQUE) 350 MG/ML injection 100 mL (100 mLs Intravenous Contrast Given 06/19/22 1715)  ipratropium-albuterol (DUONEB) 0.5-2.5 (3) MG/3ML nebulizer solution 3 mL (3 mLs Nebulization Given 06/19/22 1757)  albuterol (VENTOLIN HFA) 108 (90 Base) MCG/ACT inhaler 2 puff (2 puffs Inhalation Given 06/19/22 1901)  AeroChamber Plus Flo-Vu Medium MISC 1 each (1 each Other Given 06/19/22 1901)    ED Course/ Medical Decision Making/ A&P Clinical Course as of 06/19/22 2356  Fri Jun 19, 2022   1533 Glucose(!): 396 Patient didn't take her 10 units of insulin at lunch.  [EH]  1638 DG Chest Port 1 View Chronic bronchial thickening without acute findings [EH]  1638 Lipase, blood(!) Significantly elevated.  Did have prior [EH]  1746 Patient has a petechial rash on her right wrist.  This started prior to contrast.  Will monitor [EH]  1755 CT Angio Chest PE W and/or Wo Contrast No PE [EH]  1758 CT ABDOMEN PELVIS W CONTRAST Left ureteral stone at UVJ [EH]  1854 Patient is tolerated p.o. challenge without requiring any pain medication.  She has ambulated maintaining 97% on room air. [EH]  1955 Patient is reevaluated.  She feels like she is ready to go home.  She has passed p.o. challenge. [facility] PMP is reviewed.  Prescription sent for pain and nausea medication. [EH]    Clinical Course User Index [EH] Lorin Glass, PA-C                           Medical Decision Making Patient is a 56 year old woman who presents today for evaluation of pleuritic shortness of breath with relatively sudden onset.  She also has epigastric pain radiating into her back. Here she is afebrile, not tachycardic or tachypneic, she does have a frequent wet sounding cough. Chest x-ray is obtained showing chronic bronchial thickening. With the sudden onset of her pleuritic pain and age unable to [facility] patient out. D-dimer was obtained and was elevated at 0.55, while technically her upper limit of age-adjusted normal is 0.56 with the sudden onset of her symptoms and pleuritic nature of her chest pain this is close enough to abnormal but I do not feel this adequately rules out PE.  Additionally labs show concern for pancreatitis with her epigastric pain into her back and a elevated lipase at 122. 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  CTA PE study is obtained without evidence of dissection, PE or acute abnormality.  She does have a adrenal  lesion that is unchanged. CT abdomen pelvis does not show significant peripancreatic inflammation, however does show a 4 mm left UVJ stone with hydronephrosis and hydroureter.  UA does not appear infected, has rare bacteria and 0-5 squamous epithelial cells.  She is not having significant urinary symptoms, however  in the setting of a stone we will send urine for culture out of caution.  She was hyperglycemic on arrival with a blood sugar of 399, she was given her home dose of insulin and IV fluids after which her blood sugar improved to 151.  She does not have ketonuria, anion gap and I suspect this is secondary to her not taking her lunchtime dose of insulin.  Patient was able to tolerate p.o. intake without pain or nausea medicine while in the emergency room.  She was offered pain and nausea medicine however declined.  We will send her with a prescription for pain and nausea medication for her pancreatitis and renal stone.  Mended clear liquid diet, followed by pancreatitis eating plan.  Additionally initiate does have a stone and received contrast I recommended that she monitor her blood sugars attempting to hold her metformin.  If she is taking her insulin and her sugars are remaining over 250 then she can take the metformin, otherwise would recommend that she try to avoid the metformin for the next 2 days.     Problems Addressed: Acute recurrent pancreatitis: complicated acute illness or injury Hyperglycemia: complicated acute illness or injury    Details: Patient did not take her lunchtime dose of insulin Left ureteral stone: acute illness or injury with systemic symptoms Shortness of breath: chronic illness or injury with exacerbation, progression, or side effects of treatment  Amount and/or Complexity of Data Reviewed Independent Historian: spouse Labs: ordered. Decision-making details documented in ED Course. Radiology: ordered. Decision-making details documented in ED  Course. ECG/medicine tests: ordered.  Risk Prescription drug management. Decision regarding hospitalization.   Return precautions were discussed with patient who states their understanding.  At the time of discharge patient denied any unaddressed complaints or concerns.  Patient is agreeable for discharge home.  Note: Portions of this report may have been transcribed using voice recognition software. Every effort was made to ensure accuracy; however, inadvertent computerized transcription errors may be present         Final Clinical Impression(s) / ED Diagnoses Final diagnoses:  Left ureteral stone  Shortness of breath  Acute recurrent pancreatitis  Hyperglycemia    Rx / DC Orders ED Discharge Orders          Ordered    ondansetron (ZOFRAN-ODT) 4 MG disintegrating tablet  Every 8 hours PRN        06/19/22 1954    HYDROcodone-acetaminophen (NORCO/VICODIN) 5-325 MG tablet  Every 6 hours PRN        06/19/22 1954              Lorin Glass, Hershal Coria 06/19/22 2356    Wyvonnia Dusky, MD 06/20/22 1529

## 2022-06-19 NOTE — ED Triage Notes (Signed)
Pt brought in by EMS due to SOB and chest pain due to pain with breathing. Pt reports breathing has gotten progressively worse. Pain along bra line and into back. CBG 369 . BP elevated  190/100. Pt reports coughing started yesterday

## 2022-06-20 ENCOUNTER — Other Ambulatory Visit: Payer: Self-pay | Admitting: Internal Medicine

## 2022-06-22 ENCOUNTER — Telehealth: Payer: Self-pay | Admitting: General Practice

## 2022-06-22 ENCOUNTER — Other Ambulatory Visit: Payer: Self-pay | Admitting: Internal Medicine

## 2022-06-22 LAB — URINE CULTURE: Culture: 20000 — AB

## 2022-06-22 NOTE — Telephone Encounter (Signed)
Transition Care Management Follow-up Telephone Call Date of discharge and from where: 06/19/22 from [facility] How have you been since you were released from the hospital? She was told she has pancreatitis and a kidney stone. She reports dull pain and is taking tylenol as needed.  Any questions or concerns? No  Items Reviewed: Did the pt receive and understand the discharge instructions provided? Yes  Medications obtained and verified? No  Other? No  Any new allergies since your discharge? No  Dietary orders reviewed? Yes Do you have support at home? Yes   Home Care and Equipment/Supplies: Were home health services ordered? no  Functional Questionnaire: (I = Independent and D = Dependent) ADLs: I  Bathing/Dressing- I  Meal Prep- I  Eating- I  Maintaining continence- I  Transferring/Ambulation- I  Managing Meds- I  Follow up appointments reviewed:  PCP Hospital f/u appt confirmed? Yes  Scheduled to see Dr. Madilyn Fireman on 06/25/22 @ 1050. [place] [facility] f/u appt confirmed? No   Are transportation arrangements needed? No  If their condition worsens, is the pt aware to call PCP or go to the Emergency Dept.? Yes Was the patient provided with contact information for the PCP's office or ED? Yes Was to pt encouraged to call back with questions or concerns? Yes

## 2022-06-23 ENCOUNTER — Telehealth: Payer: Self-pay

## 2022-06-23 NOTE — Telephone Encounter (Signed)
Post ED Visit - Positive Culture Follow-up  Culture report reviewed by antimicrobial stewardship pharmacist: [place] [facility] '[]'$  Elenor Quinones, Pharm.D. '[]'$  Heide Guile, Pharm.D., BCPS AQ-ID '[x]'$  Luisa Hart, Pharm.D., BCPS '[]'$  Alycia Rossetti, Pharm.D., BCPS '[]'$  Estero, Pharm.D., BCPS, AAHIVP '[]'$  Legrand Como, Pharm.D., BCPS, AAHIVP '[]'$  Salome Arnt, PharmD, BCPS '[]'$  Johnnette Gourd, PharmD, BCPS '[]'$  Hughes Better, PharmD, BCPS '[]'$  Leeroy Cha, PharmD '[]'$  Laqueta Linden, PharmD, BCPS '[]'$  Albertina Parr, PharmD  [place] Team '[]'$  Leodis Sias, PharmD '[]'$  Lindell Spar, PharmD '[]'$  Royetta Asal, PharmD '[]'$  Graylin Shiver, Rph '[]'$  Rema Fendt) Glennon Mac, PharmD '[]'$  Arlyn Dunning, PharmD '[]'$  Netta Cedars, PharmD '[]'$  Dia Sitter, PharmD '[]'$  Leone Haven, PharmD '[]'$  Gretta Arab, PharmD '[]'$  Theodis Shove, PharmD '[]'$  Peggyann Juba, PharmD '[]'$  Reuel Boom, PharmD   Positive urine culture  Plan - No further tx, follow up with PCP  Per note pt is scheduled to see Dr. Madilyn Fireman on 06/25/2022. no further patient follow-up is required at this time.  Glennon Hamilton 06/23/2022, 10:37 AM

## 2022-06-23 NOTE — Progress Notes (Signed)
ED Antimicrobial Stewardship Positive Culture Follow Up   Terri Zimmerman is an 56 y.o. female who presented to [facility] on 06/19/2022 with a chief complaint of  Chief Complaint  Patient presents with   Shortness of Breath    Recent Results (from the past 720 hour(s))  SARS Coronavirus 2 by RT PCR (hospital order, performed in [facility] hospital lab) *cepheid single result test* Anterior Nasal Swab     Status: None   Collection Time: 06/19/22  3:26 PM   Specimen: Anterior Nasal Swab  Result Value Ref Range Status   SARS Coronavirus 2 by RT PCR NEGATIVE NEGATIVE Final    Comment: (NOTE) SARS-CoV-2 target nucleic acids are NOT DETECTED.  The SARS-CoV-2 RNA is generally detectable in upper and lower respiratory specimens during the acute phase of infection. The lowest concentration of SARS-CoV-2 viral copies this assay can detect is 250 copies / mL. A negative result does not preclude SARS-CoV-2 infection and should not be used as the sole basis for treatment or other patient management decisions.  A negative result may occur with improper specimen collection / handling, submission of specimen other than nasopharyngeal swab, presence of viral mutation(s) within the areas targeted by this assay, and inadequate number of viral copies (<250 copies / mL). A negative result must be combined with clinical observations, patient history, and epidemiological information.  Fact Sheet for Patients:   https://www.patel.info/  Fact Sheet for Healthcare Providers: https://hall.com/  This test is not yet approved or  cleared by the [place] FDA and has been authorized for detection and/or diagnosis of SARS-CoV-2 by FDA under an Emergency Use Authorization (EUA).  This EUA will remain in effect (meaning this test can be used) for the duration of the COVID-19 declaration under Section 564(b)(1) of the Act, 21 U.S.C. section 360bbb-3(b)(1), unless  the authorization is terminated or revoked sooner.  Performed at [facility], [address] [place], [place] [address]   Urine Culture     Status: Abnormal   Collection Time: 06/19/22  5:51 PM   Specimen: Urine, Clean Catch  Result Value Ref Range Status   Specimen Description   Final    URINE, CLEAN CATCH Performed at [facility], [address] [place], [place] [address]    Special Requests   Final    NONE Performed at [facility], [address] [place], [place] [address]    Culture 20,000 COLONIES/mL ESCHERICHIA COLI (A)  Final   Report Status 06/22/2022 FINAL  Final   Organism ID, Bacteria ESCHERICHIA COLI (A)  Final      Susceptibility   Escherichia coli - MIC*    AMPICILLIN 8 SENSITIVE Sensitive     CEFAZOLIN <=4 SENSITIVE Sensitive     CEFEPIME <=0.12 SENSITIVE Sensitive     CEFTRIAXONE <=0.25 SENSITIVE Sensitive     CIPROFLOXACIN <=0.25 SENSITIVE Sensitive     GENTAMICIN <=1 SENSITIVE Sensitive     IMIPENEM <=0.25 SENSITIVE Sensitive     NITROFURANTOIN <=16 SENSITIVE Sensitive     TRIMETH/SULFA <=20 SENSITIVE Sensitive     AMPICILLIN/SULBACTAM 4 SENSITIVE Sensitive     PIP/TAZO <=4 SENSITIVE Sensitive     * 20,000 COLONIES/mL ESCHERICHIA COLI   Patient discharged originally without antimicrobial agent and treatment is not indicated. Pt received f/u call from RN on 8/7 and reported a dull ache and was taking Tylenol prn. No other s/sx of UTI reported. Pt has a f/u appointment with  Dr. Madilyn Fireman on 06/25/22 @ 10:50.   New antibiotic prescription: No further treatment needed at this time. Continue plan to f/u with PCP on 8/10.   ED Provider: Sherrell Puller, PA-C   Kaleen Mask, PharmD, BCPS 06/23/2022, 9:42 AM Clinical Pharmacist Monday - Friday phone -  [phone] Saturday - Sunday phone - [phone]

## 2022-06-25 ENCOUNTER — Ambulatory Visit (INDEPENDENT_AMBULATORY_CARE_PROVIDER_SITE_OTHER): Payer: BC Managed Care – PPO | Admitting: Family Medicine

## 2022-06-25 VITALS — BP 154/78 | HR 96 | Ht 61.0 in | Wt 189.0 lb

## 2022-06-25 DIAGNOSIS — I10 Essential (primary) hypertension: Secondary | ICD-10-CM | POA: Diagnosis not present

## 2022-06-25 DIAGNOSIS — R350 Frequency of micturition: Secondary | ICD-10-CM

## 2022-06-25 DIAGNOSIS — R053 Chronic cough: Secondary | ICD-10-CM

## 2022-06-25 DIAGNOSIS — D3501 Benign neoplasm of right adrenal gland: Secondary | ICD-10-CM

## 2022-06-25 DIAGNOSIS — N132 Hydronephrosis with renal and ureteral calculous obstruction: Secondary | ICD-10-CM

## 2022-06-25 DIAGNOSIS — R109 Unspecified abdominal pain: Secondary | ICD-10-CM

## 2022-06-25 DIAGNOSIS — K8500 Idiopathic acute pancreatitis without necrosis or infection: Secondary | ICD-10-CM

## 2022-06-25 LAB — POCT URINALYSIS DIP (CLINITEK)
Bilirubin, UA: NEGATIVE
Blood, UA: NEGATIVE
Glucose, UA: NEGATIVE mg/dL
Ketones, POC UA: NEGATIVE mg/dL
Leukocytes, UA: NEGATIVE
Nitrite, UA: NEGATIVE
POC PROTEIN,UA: NEGATIVE
Spec Grav, UA: 1.015 (ref 1.010–1.025)
Urobilinogen, UA: 0.2 E.U./dL
pH, UA: 6 (ref 5.0–8.0)

## 2022-06-25 MED ORDER — VALSARTAN 320 MG PO TABS: 320.0000 mg | ORAL_TABLET | Freq: Every day | ORAL | 1 refills | Status: DC

## 2022-06-25 NOTE — Assessment & Plan Note (Signed)
Repeat blood pressure is significantly elevated today.  Will increase Diovan to 320 mg.  Follow-up in 2 weeks for nurse visit to recheck BP.

## 2022-06-25 NOTE — Progress Notes (Signed)
Established Patient Office Visit  Subjective   Patient ID: Terri Zimmerman, female    DOB: [birth year]  Age: 56 y.o. MRN: [id number]  Chief Complaint  Patient presents with   Hospitalization Follow-up    HPI  Here today for hospital follow-up.  She went to the ED for chest pain and shortness of breath that felt like it was gradually getting worse throughout the day.  Seen in the ED August 4 for acute pancreatitis.  Lipase was just mildly elevated at 122 but glucose was in the 300s.  She was given pain medication.  Chest x-ray showed chronic bronchial thickening D-dimer was just slightly elevated at 0.55 so they did do a CT angio.  It was otherwise negative but did have an adrenal lesion that was unchanged.  She did also have a 4 mm left UVJ stone with some hydrant with nephrosis and hydroureter.  Urine culture was negative just showed 20,000 CFU of E. coli.  Since being home she actually has had some urinary frequency and pressure.  No hematuria may have.  She has had some right flank pain and right lower back pain even though the stone was noted to be on the left side.  He does follow with endocrinology and they recently upped her metformin since her blood sugars have been running in the 200s.  Also scheduled for colonoscopy on August 24 to follow-up on abnormal Cologuard results.  She is a little nervous about it.    ROS    Objective:     BP (!) 154/78   Pulse 96   Ht '5\' 1"'$  (1.549 m)   Wt 189 lb (85.7 kg)   SpO2 96%   BMI 35.71 kg/m    Physical Exam Vitals and nursing note reviewed.  Constitutional:      Appearance: She is well-developed.  HENT:     Head: Normocephalic and atraumatic.  Eyes:     Conjunctiva/sclera: Conjunctivae normal.  Cardiovascular:     Rate and Rhythm: Normal rate and regular rhythm.     Heart sounds: Normal heart sounds.  Pulmonary:     Effort: Pulmonary effort is normal.     Breath sounds: Normal breath sounds.  Abdominal:     General: Bowel  sounds are normal. There is no distension.     Palpations: Abdomen is soft.     Tenderness: There is no abdominal tenderness.  Skin:    General: Skin is warm and dry.  Neurological:     Mental Status: She is alert and oriented to person, place, and time.  Psychiatric:        Mood and Affect: Mood normal.        Behavior: Behavior normal.      Results for orders placed or performed in visit on 06/25/22  POCT URINALYSIS DIP (CLINITEK)  Result Value Ref Range   Color, UA yellow yellow   Clarity, UA clear clear   Glucose, UA negative negative mg/dL   Bilirubin, UA negative negative   Ketones, POC UA negative negative mg/dL   Spec Grav, UA 1.015 1.010 - 1.025   Blood, UA negative negative   pH, UA 6.0 5.0 - 8.0   POC PROTEIN,UA negative negative, trace   Urobilinogen, UA 0.2 0.2 or 1.0 E.U./dL   Nitrite, UA Negative Negative   Leukocytes, UA Negative Negative      The 10-year ASCVD risk score (Arnett DK, et al., 2019) is: 19.8%    Assessment & Plan:   Problem  List Items Addressed This Visit       Cardiovascular and Mediastinum   Essential hypertension    Repeat blood pressure is significantly elevated today.  Will increase Diovan to 320 mg.  Follow-up in 2 weeks for nurse visit to recheck BP.      Relevant Medications   valsartan (DIOVAN) 320 MG tablet   Other Relevant Orders   BASIC METABOLIC PANEL WITH GFR   CBC   Lipase     Endocrine   Adrenal adenoma, right    Stable on recent CT.      Other Visit Diagnoses     Urinary frequency    -  Primary   Relevant Orders   BASIC METABOLIC PANEL WITH GFR   CBC   Lipase   POCT URINALYSIS DIP (CLINITEK) (Completed)   Urine Culture   Ureteral stone with hydronephrosis       Relevant Orders   BASIC METABOLIC PANEL WITH GFR   CBC   Lipase   Idiopathic acute pancreatitis without infection or necrosis       Hypocalcemia       Relevant Orders   BASIC METABOLIC PANEL WITH GFR   CBC   Lipase   Chronic cough        Relevant Medications   valsartan (DIOVAN) 320 MG tablet   Right flank pain           Urinary frequency-it could be that the stone is moved into the bladder that certainly possible but she may also have a UTI so we will repeat urinalysis and urine culture today.  Ureteral stone with hydronephrosis-she not having significant pain or discomfort on the left just more suprapubic pressure.  Will see if she might have a UTI if it is negative and the pressure and symptoms do not resolve in the next few days then consider repeat scan/imaging to see where the stone is out.  If she does have a UTI then we will treat and see if symptoms resolve and then we may still need to follow that stone up to see if it has resolved being that she is not having any severe pain it is difficult to say if she is moved or passed the stone.  Acute pancreatitis-she has had 4 more episodes.  Fortunately this episode was mild.  She is feeling better.  Advance diet as tolerated.  She also wanted to know if it would be okay to start a fiber supplement.  We discussed that this is really good for colon health and I would encourage her to start it.  Right flank pain and right low back pain-most of the issues including the stone and pancreatitis are on the left some not 100% sure what is causing this it may be more musculoskeletal.  Recommend conservative measures including stretching etc. if not improving then please let us know.  Return in 2 weeks (on 07/09/2022), or if symptoms worsen or fail to improve, for Hypertension - nurse visit.   I spent 42 minutes on the day of the encounter to include pre-visit record review, face-to-face time with the patient and post visit ordering of test.   Beatrice Lecher, MD

## 2022-06-25 NOTE — Assessment & Plan Note (Signed)
Stable on recent CT.

## 2022-06-25 NOTE — Patient Instructions (Signed)
Pick up new prescription of valsartan hand.  We are increasing your dose to better control your blood pressure.  Please follow-up in 2 weeks for nurse visit to recheck BP

## 2022-06-26 ENCOUNTER — Encounter: Payer: Self-pay | Admitting: Family Medicine

## 2022-06-26 LAB — BASIC METABOLIC PANEL WITH GFR
BUN: 7 mg/dL (ref 7–25)
CO2: 28 mmol/L (ref 20–32)
Calcium: 9.4 mg/dL (ref 8.6–10.4)
Chloride: 106 mmol/L (ref 98–110)
Creat: 0.74 mg/dL (ref 0.50–1.03)
Glucose, Bld: 203 mg/dL — ABNORMAL HIGH (ref 65–99)
Potassium: 4.2 mmol/L (ref 3.5–5.3)
Sodium: 143 mmol/L (ref 135–146)
eGFR: 95 mL/min/{1.73_m2} (ref >=60)

## 2022-06-26 LAB — CBC
HCT: 42.8 % (ref 35.0–45.0)
Hemoglobin: 13.9 g/dL (ref 11.7–15.5)
MCH: 28.1 pg (ref 27.0–33.0)
MCHC: 32.5 g/dL (ref 32.0–36.0)
MCV: 86.5 fL (ref 80.0–100.0)
MPV: 9.9 fL (ref 7.5–12.5)
Platelets: 266 10*3/uL (ref 140–400)
RBC: 4.95 10*6/uL (ref 3.80–5.10)
RDW: 13.5 % (ref 11.0–15.0)
WBC: 11.7 10*3/uL — ABNORMAL HIGH (ref 3.8–10.8)

## 2022-06-26 LAB — LIPASE: Lipase: 18 U/L (ref 7–60)

## 2022-06-26 NOTE — Progress Notes (Signed)
Hi Terri Zimmerman, calcium levels back to normal which is great.  Glucose is better at 200 but still high.  Your blood count is still just a little borderline but not in a worrisome range.  No anemia.  Lipase is all the way back down to normal which is great.

## 2022-06-27 LAB — URINE CULTURE
MICRO NUMBER:: 13767219
SPECIMEN QUALITY:: ADEQUATE

## 2022-06-28 NOTE — Progress Notes (Signed)
Urine culture is negative.  Let us know if you are still having symptoms.

## 2022-07-08 ENCOUNTER — Ambulatory Visit: Payer: BC Managed Care – PPO

## 2022-07-09 DIAGNOSIS — D122 Benign neoplasm of ascending colon: Secondary | ICD-10-CM | POA: Diagnosis not present

## 2022-07-09 DIAGNOSIS — Z1211 Encounter for screening for malignant neoplasm of colon: Secondary | ICD-10-CM | POA: Diagnosis not present

## 2022-07-09 DIAGNOSIS — E119 Type 2 diabetes mellitus without complications: Secondary | ICD-10-CM | POA: Diagnosis not present

## 2022-07-09 DIAGNOSIS — D120 Benign neoplasm of cecum: Secondary | ICD-10-CM | POA: Diagnosis not present

## 2022-07-09 DIAGNOSIS — D123 Benign neoplasm of transverse colon: Secondary | ICD-10-CM | POA: Diagnosis not present

## 2022-07-09 DIAGNOSIS — R195 Other fecal abnormalities: Secondary | ICD-10-CM | POA: Diagnosis not present

## 2022-07-13 ENCOUNTER — Ambulatory Visit: Payer: BC Managed Care – PPO

## 2022-07-21 ENCOUNTER — Ambulatory Visit (INDEPENDENT_AMBULATORY_CARE_PROVIDER_SITE_OTHER): Payer: BC Managed Care – PPO | Admitting: Family Medicine

## 2022-07-21 VITALS — BP 156/76 | HR 93

## 2022-07-21 DIAGNOSIS — Z23 Encounter for immunization: Secondary | ICD-10-CM | POA: Diagnosis not present

## 2022-07-21 DIAGNOSIS — I10 Essential (primary) hypertension: Secondary | ICD-10-CM

## 2022-07-21 MED ORDER — METOPROLOL SUCCINATE ER 50 MG PO TB24: 50.0000 mg | ORAL_TABLET | Freq: Every day | ORAL | 1 refills | Status: DC

## 2022-07-21 NOTE — Progress Notes (Signed)
   Established Patient Office Visit  Subjective   Patient ID: Terri Zimmerman, female    DOB: December 15, 1965  Age: 56 y.o. MRN: [id number]  Chief Complaint  Patient presents with   Hypertension    HPI  Terri Zimmerman is here for blood pressure check. Denies chest pain, shortness of breath or dizziness.   ROS    Objective:     BP (!) 156/76   Pulse 93   SpO2 97%    Physical Exam   No results found for any visits on 07/21/22.    The 10-year ASCVD risk score (Arnett DK, et al., 2019) is: 20.3%    Assessment & Plan:  HTN - Per Dr Madilyn Fireman, patient advised to increase the Metoprolol to 50 mg daily and continue the Valsartan 320 mg daily. Follow up in 3 weeks for blood pressure check.   Problem List Items Addressed This Visit       Unprioritized   Essential hypertension - Primary   Other Visit Diagnoses     Need for immunization against influenza       Relevant Orders   Flu Vaccine QUAD 6moIM (Fluarix, Fluzone & Alfiuria Quad PF) (Completed)   Need for shingles vaccine       Relevant Orders   Varicella-zoster vaccine IM (Completed)       Return in about 3 weeks (around 08/11/2022) for nurse visit blood pressure check. .Durene Romans AM[place]ico Blitz [place]

## 2022-07-21 NOTE — Progress Notes (Signed)
Hypertension-still not well controlled.  Increase metoprolol to 50 mg and follow-up in 3 weeks for nurse visit to recheck.

## 2022-07-29 ENCOUNTER — Encounter: Payer: Self-pay | Admitting: Family Medicine

## 2022-07-29 NOTE — Progress Notes (Signed)
Abstraction of last A1C from hospital [facility].

## 2022-07-31 ENCOUNTER — Ambulatory Visit (INDEPENDENT_AMBULATORY_CARE_PROVIDER_SITE_OTHER): Payer: BC Managed Care – PPO | Admitting: Podiatry

## 2022-07-31 ENCOUNTER — Ambulatory Visit (INDEPENDENT_AMBULATORY_CARE_PROVIDER_SITE_OTHER): Payer: BC Managed Care – PPO

## 2022-07-31 ENCOUNTER — Encounter: Payer: Self-pay | Admitting: Podiatry

## 2022-07-31 DIAGNOSIS — M722 Plantar fascial fibromatosis: Secondary | ICD-10-CM | POA: Diagnosis not present

## 2022-07-31 DIAGNOSIS — M79671 Pain in right foot: Secondary | ICD-10-CM

## 2022-07-31 DIAGNOSIS — E1142 Type 2 diabetes mellitus with diabetic polyneuropathy: Secondary | ICD-10-CM

## 2022-07-31 MED ORDER — MELOXICAM 15 MG PO TABS: 15.0000 mg | ORAL_TABLET | Freq: Every day | ORAL | 0 refills | Status: DC

## 2022-07-31 NOTE — Progress Notes (Signed)
  Subjective:  Patient ID: Terri Zimmerman, female    DOB: 02-09-1966,   MRN: [id number]  Chief Complaint  Patient presents with   Foot Pain     Right heel pain - on going since April 2023    56 y.o. female presents for concern of right heel pain that has been ongoing since April. She relates constant pain. Relates it hurst with sitting and standing and nothing has helped with the pain. She is diabetic and last A1c was 8.9.  Denies any other pedal complaints. Denies n/v/f/c.   Past Medical History:  Diagnosis Date   Asthma 04-25-07   Diabetes mellitus 11-12-06   type 2   Hypertension 11-04-06    Objective:  Physical Exam: Vascular: DP/PT pulses 2/4 bilateral. CFT <3 seconds. Normal hair growth on digits. No edema.  Skin. No lacerations or abrasions bilateral feet.  Musculoskeletal: MMT 5/5 bilateral lower extremities in DF, PF, Inversion and Eversion. Deceased ROM in DF of ankle joint. Tender to medial calcaneal tubercle on the right some pain along achilles. No pain along arch or PT tendon. No pain with calcaneal squeeze.  Neurological: Sensation intact to light touch. Protective sensation diminished.   Assessment:   1. Plantar fasciitis of right foot   2. Type 2 diabetes mellitus with diabetic polyneuropathy, unspecified whether long term insulin use ([place])      Plan:  Patient was evaluated and treated and all questions answered. Discussed plantar fasciitis with patient.  X-rays reviewed and discussed with patient. No acute fractures or dislocations noted. Mild spurring noted at inferior calcaneus.  Discussed treatment options including, ice, NSAIDS, supportive shoes, bracing, and stretching. Stretching exercises provided to be done on a daily basis.   Prescription for meloxicam provided and sent to pharmacy. PF brace dispensed.  Follow-up 6 weeks or sooner if any problems arise. In the meantime, encouraged to call the office with any questions, concerns, change in symptoms.      Lorenda Peck, DPM

## 2022-07-31 NOTE — Patient Instructions (Signed)

## 2022-08-10 ENCOUNTER — Ambulatory Visit (INDEPENDENT_AMBULATORY_CARE_PROVIDER_SITE_OTHER): Payer: BC Managed Care – PPO | Admitting: Family Medicine

## 2022-08-10 VITALS — BP 134/65 | HR 89

## 2022-08-10 DIAGNOSIS — Z978 Presence of other specified devices: Secondary | ICD-10-CM | POA: Diagnosis not present

## 2022-08-10 DIAGNOSIS — E1165 Type 2 diabetes mellitus with hyperglycemia: Secondary | ICD-10-CM | POA: Diagnosis not present

## 2022-08-10 DIAGNOSIS — I10 Essential (primary) hypertension: Secondary | ICD-10-CM | POA: Diagnosis not present

## 2022-08-10 DIAGNOSIS — Z794 Long term (current) use of insulin: Secondary | ICD-10-CM | POA: Diagnosis not present

## 2022-08-10 LAB — HEMOGLOBIN A1C: Hemoglobin A1C: 8.9

## 2022-08-10 LAB — MICROALBUMIN / CREATININE URINE RATIO: Microalb Creat Ratio: 78.7

## 2022-08-10 NOTE — Progress Notes (Signed)
   Established Patient Office Visit  Subjective   Patient ID: Terri Zimmerman, female    DOB: Jun 20, 1966  Age: 56 y.o. MRN: [id number]  Chief Complaint  Patient presents with   Hypertension    HPI  Terri Zimmerman is here for blood pressure check. Denies chest pain or dizziness.   ROS    Objective:     BP 134/65   Pulse 89   SpO2 97%    Physical Exam   No results found for any visits on 08/10/22.    The 10-year ASCVD risk score (Arnett DK, et al., 2019) is: 15.4%    Assessment & Plan:  Hypertension - Second check of blood pressure was 134/65. Patient advised to continue current medications as directed.   Problem List Items Addressed This Visit       Unprioritized   Essential hypertension - Primary    Return in about 3 months (around 11/09/2022) for HTN with Dr Madilyn Fireman. Durene Romans, Monico Blitz, [place]

## 2022-08-10 NOTE — Progress Notes (Signed)
Blood pressure looks much better today!  Fantastic.  We will continue to monitor.  No additional changes today.

## 2022-08-11 ENCOUNTER — Ambulatory Visit: Payer: BC Managed Care – PPO

## 2022-08-12 ENCOUNTER — Telehealth: Payer: Self-pay

## 2022-08-12 ENCOUNTER — Other Ambulatory Visit: Payer: Self-pay | Admitting: Family Medicine

## 2022-08-12 NOTE — Telephone Encounter (Signed)
Initiated Prior authorization [id number] '2MG'$  tablets Via: Covermymeds Case/Key:[id number] Status: approved  as of 08/12/22 Reason:Coverage Start Date:07/13/2022;Coverage End Date:11/15/2098; Notified Pt via: Mychart

## 2022-08-27 ENCOUNTER — Other Ambulatory Visit: Payer: Self-pay | Admitting: Podiatry

## 2022-09-03 ENCOUNTER — Other Ambulatory Visit: Payer: Self-pay | Admitting: Family Medicine

## 2022-09-07 ENCOUNTER — Other Ambulatory Visit: Payer: Self-pay | Admitting: Internal Medicine

## 2022-09-11 ENCOUNTER — Ambulatory Visit: Payer: BC Managed Care – PPO | Admitting: Podiatrist

## 2022-10-01 ENCOUNTER — Ambulatory Visit (INDEPENDENT_AMBULATORY_CARE_PROVIDER_SITE_OTHER): Payer: BC Managed Care – PPO

## 2022-10-01 DIAGNOSIS — Z1231 Encounter for screening mammogram for malignant neoplasm of breast: Secondary | ICD-10-CM | POA: Diagnosis not present

## 2022-10-05 NOTE — Progress Notes (Signed)
Please call patient. Normal mammogram.  Repeat in 1 year.  

## 2022-10-06 ENCOUNTER — Ambulatory Visit (INDEPENDENT_AMBULATORY_CARE_PROVIDER_SITE_OTHER): Payer: BC Managed Care – PPO

## 2022-10-06 ENCOUNTER — Encounter: Payer: Self-pay | Admitting: Family Medicine

## 2022-10-06 ENCOUNTER — Ambulatory Visit (INDEPENDENT_AMBULATORY_CARE_PROVIDER_SITE_OTHER): Payer: BC Managed Care – PPO | Admitting: Family Medicine

## 2022-10-06 VITALS — BP 125/73 | HR 86 | Ht 61.0 in | Wt 193.0 lb

## 2022-10-06 DIAGNOSIS — R599 Enlarged lymph nodes, unspecified: Secondary | ICD-10-CM | POA: Diagnosis not present

## 2022-10-06 DIAGNOSIS — E119 Type 2 diabetes mellitus without complications: Secondary | ICD-10-CM | POA: Diagnosis not present

## 2022-10-06 DIAGNOSIS — I10 Essential (primary) hypertension: Secondary | ICD-10-CM | POA: Diagnosis not present

## 2022-10-06 DIAGNOSIS — R221 Localized swelling, mass and lump, neck: Secondary | ICD-10-CM | POA: Diagnosis not present

## 2022-10-06 DIAGNOSIS — E042 Nontoxic multinodular goiter: Secondary | ICD-10-CM | POA: Diagnosis not present

## 2022-10-06 DIAGNOSIS — U099 Post covid-19 condition, unspecified: Secondary | ICD-10-CM

## 2022-10-06 DIAGNOSIS — E118 Type 2 diabetes mellitus with unspecified complications: Secondary | ICD-10-CM | POA: Diagnosis not present

## 2022-10-06 DIAGNOSIS — R053 Chronic cough: Secondary | ICD-10-CM

## 2022-10-06 DIAGNOSIS — M791 Myalgia, unspecified site: Secondary | ICD-10-CM

## 2022-10-06 DIAGNOSIS — F17200 Nicotine dependence, unspecified, uncomplicated: Secondary | ICD-10-CM

## 2022-10-06 DIAGNOSIS — R5383 Other fatigue: Secondary | ICD-10-CM

## 2022-10-06 DIAGNOSIS — Z23 Encounter for immunization: Secondary | ICD-10-CM

## 2022-10-06 DIAGNOSIS — R059 Cough, unspecified: Secondary | ICD-10-CM

## 2022-10-06 LAB — HM DIABETES EYE EXAM

## 2022-10-06 MED ORDER — MOMETASONE FURO-FORMOTEROL FUM 200-5 MCG/ACT IN AERO: 2.0000 | INHALATION_SPRAY | Freq: Two times a day (BID) | RESPIRATORY_TRACT | 1 refills | Status: DC

## 2022-10-06 NOTE — Assessment & Plan Note (Signed)
Following with endocrinology.  Prevnar 20 given today.  It looks like her foot exam is up-to-date we will get that abstracted into our chart.

## 2022-10-06 NOTE — Progress Notes (Signed)
Terri Zimmerman, great news did not see anything worrisome on the ultrasound no abnormal cysts or lymph nodes.  Everything really looked reassuring.  You had a few tiny little nodules on your thyroid gland but nothing that looks worrisome or will need any type of further work-up or biopsy.

## 2022-10-06 NOTE — Assessment & Plan Note (Signed)
BP looks great today.  She reports home blood pressures have been running a little bit higher in the 140s to 170s at times but again it looks fantastic today some little hesitant to make any changes I think for now we will just continue to monitor.

## 2022-10-06 NOTE — Progress Notes (Signed)
Established Patient Office Visit  Subjective   Patient ID: Terri Zimmerman, female    DOB: 02-27-1966  Age: 56 y.o. MRN: [id number]  Chief Complaint  Patient presents with   Hypertension    HPI  Hypertension- Pt denies chest pain, SOB, dizziness, or heart palpitations.  Taking meds as directed w/o problems.  Denies medication side effects.    Diabetes is followed by endocrinology. Due for Prevnar 20.  She did have her foot exam there and she is actually scheduled for her eye appointment later today.  Really worked hard to make some changes.  She is cut out soda and she has been trying to cut back on potatoes and [place] fries.  Having a continuous glucose meter has actually been really helpful.  She has a history of pancreatitis so is not a good candidate for a GLP-1.  She is currently taking 20 units of Tresiba in the morning and sliding scale insulin with meals.  More recently her sugars have been under 200 so she does feel like she is making some progress.  She does have a follow-up with endocrinology at the end of December.  Is also still struggling with her cough.  She says it started initially after her first shingles vaccine and then got it got better then after her second vaccine it started up again.  It just has not really completely let up.  She did see pulmonology and they put her on Dulera and Advair.  She is using them both intermittently.  And then she ended up going to the ED in August and had a chest CT scan which was negative for DVT she still feels winded at times and short of breath and feels like there is still some mucus in her upper airway.   Also noticed some tightness and stiffness in the right side of her neck she feels like maybe there is a swollen lymph node there.  She also just says in general she does not feel great.  She just feels like she has a lot of muscle and body aches.    ROS    Objective:     BP 125/73   Pulse 86   Ht '5\' 1"'$  (1.549 m)   Wt 193 lb  (87.5 kg)   SpO2 97%   BMI 36.47 kg/m    Physical Exam Vitals and nursing note reviewed.  Constitutional:      Appearance: She is well-developed.  HENT:     Head: Normocephalic and atraumatic.     Comments: There is a palpable oval-shaped lymph node on the right side of the neck closer to the base of the sternocleidomastoid towards the shoulder area. Cardiovascular:     Rate and Rhythm: Normal rate and regular rhythm.     Heart sounds: Normal heart sounds.  Pulmonary:     Effort: Pulmonary effort is normal.     Breath sounds: Normal breath sounds.  Skin:    General: Skin is warm and dry.  Neurological:     Mental Status: She is alert and oriented to person, place, and time.  Psychiatric:        Behavior: Behavior normal.      Results for orders placed or performed in visit on 10/06/22  Hemoglobin A1c  Result Value Ref Range   Hemoglobin A1C 8.9       The 10-year ASCVD risk score (Arnett DK, et al., 2019) is: 13.5%    Assessment & Plan:   Problem List  Items Addressed This Visit       Cardiovascular and Mediastinum   Essential hypertension - Primary    BP looks great today.  She reports home blood pressures have been running a little bit higher in the 140s to 170s at times but again it looks fantastic today some little hesitant to make any changes I think for now we will just continue to monitor.      Relevant Orders   CBC with Differential/Platelet   TSH     Endocrine   Diabetes mellitus type 2 with complications [facility])    Following with endocrinology.  Prevnar 20 given today.  It looks like her foot exam is up-to-date we will get that abstracted into our chart.      Relevant Orders   CBC with Differential/Platelet   TSH     Other   Tobacco dependence   Post-COVID chronic cough   Other Visit Diagnoses     Need for pneumococcal 20-valent conjugate vaccination       Relevant Orders   Pneumococcal conjugate vaccine 20-valent (Prevnar 20) (Completed)    Other fatigue       Relevant Orders   CBC with Differential/Platelet   TSH   Swollen lymph nodes       Relevant Orders   CBC with Differential/Platelet   TSH   US Soft Tissue Head/Neck (NON-THYROID)   Cough       Relevant Medications   mometasone-formoterol (DULERA) 200-5 MCG/ACT AERO   Myalgia           Cough -which continues to be persistent really over the last 6 months.  She has not had a chance to get back in with pulmonology and she got sick again and so had to cancel the appointment and never made it back in so I did encourage her to schedule that appointment.  Also encouraged her to use the [facility] daily.  Refill sent to pharmacy and just use the albuterol as needed for rescue medication.  She does use tobacco.  Lymphadenopathy-she has a palpable swollen lymph node on the right side of her neck near the shoulder area.  We discussed getting an ultrasound for further work-up.  We will check CBC as well.  Return in about 6 months (around 04/06/2023).    Beatrice Lecher, MD

## 2022-10-07 LAB — CBC WITH DIFFERENTIAL/PLATELET
Absolute Monocytes: 420 cells/uL (ref 200–950)
Basophils Absolute: 48 cells/uL (ref 0–200)
Basophils Relative: 0.4 %
Eosinophils Absolute: 156 cells/uL (ref 15–500)
Eosinophils Relative: 1.3 %
HCT: 45.3 % — ABNORMAL HIGH (ref 35.0–45.0)
Hemoglobin: 14.6 g/dL (ref 11.7–15.5)
Lymphs Abs: 3900 cells/uL (ref 850–3900)
MCH: 27.6 pg (ref 27.0–33.0)
MCHC: 32.2 g/dL (ref 32.0–36.0)
MCV: 85.6 fL (ref 80.0–100.0)
MPV: 10 fL (ref 7.5–12.5)
Monocytes Relative: 3.5 %
Neutro Abs: 7476 cells/uL (ref 1500–7800)
Neutrophils Relative %: 62.3 %
Platelets: 274 10*3/uL (ref 140–400)
RBC: 5.29 10*6/uL — ABNORMAL HIGH (ref 3.80–5.10)
RDW: 13.5 % (ref 11.0–15.0)
Total Lymphocyte: 32.5 %
WBC: 12 10*3/uL — ABNORMAL HIGH (ref 3.8–10.8)

## 2022-10-07 LAB — TSH: TSH: 2.19 mIU/L (ref 0.40–4.50)

## 2022-10-07 NOTE — Progress Notes (Signed)
Hi Joretta, hemoglobin looks good.  Your white blood cells are right around 12 but I think that is close to your baseline.  Thyroid looks perfect.  So I think the big focus is just can to be getting your sugars under better control to see if you start feeling better.

## 2022-10-13 ENCOUNTER — Telehealth: Payer: Self-pay

## 2022-10-13 NOTE — Telephone Encounter (Signed)
Initiated Prior authorization [id number]:[id number] 200-5MCG/ACT aerosol Via: Covermymeds Case/Key:[id number]  Status: approved  as of 10/13/22 Reason:Coverage Start Date:09/13/2022;Coverage End Date:10/13/2023; Notified Pt via: Mychart

## 2022-10-21 ENCOUNTER — Encounter: Payer: Self-pay | Admitting: Family Medicine

## 2022-11-05 ENCOUNTER — Ambulatory Visit
Admission: EM | Admit: 2022-11-05 | Discharge: 2022-11-05 | Disposition: A | Discharge status: Home / Self Care | Payer: BC Managed Care – PPO | Attending: Family Medicine | Admitting: Family Medicine

## 2022-11-05 DIAGNOSIS — Z1152 Encounter for screening for COVID-19: Secondary | ICD-10-CM | POA: Insufficient documentation

## 2022-11-05 DIAGNOSIS — J441 Chronic obstructive pulmonary disease with (acute) exacerbation: Secondary | ICD-10-CM

## 2022-11-05 MED ORDER — PREDNISONE 20 MG PO TABS: ORAL_TABLET | ORAL | 0 refills | Status: DC

## 2022-11-05 MED ORDER — DOXYCYCLINE HYCLATE 100 MG PO CAPS: ORAL_CAPSULE | ORAL | 0 refills | Status: DC

## 2022-11-05 NOTE — ED Provider Notes (Signed)
[name]    CSN: [id number] Arrival date & time: 11/05/22  1319      History   Chief Complaint Chief Complaint  Patient presents with   Cough    HPI Terri Zimmerman is a 56 y.o. female.   Patient complains of a persistent cough for several weeks, worse during the past week.  During the past 6 days she has had a mild sore throat, right earache, minimal sinus congestion, and increased wheezing.  She denies pleuritic pain, shortness of breath, and fever. She has asthma, using both an albuterol inhaler and Dulera.   The history is provided by the patient.    Past Medical History:  Diagnosis Date   Asthma 04-25-07   Diabetes mellitus 11-12-06   type 2   Hypertension 11-04-06    Patient Active Problem List   Diagnosis Date Noted   Primary osteoarthritis of right knee 07/30/2021   Aortic atherosclerosis ([place]) 05/26/2021   History of COVID-19 05/16/2021   Tobacco dependence 05/16/2021   Recurrent non-productive cough 05/09/2021   Lymphocytosis 05/09/2021   Abnormal QT interval present on electrocardiogram 05/09/2021   Left leg swelling 04/24/2021   Irregular heart rhythm 04/23/2021   Cough, persistent 04/23/2021   Polyarthralgia 04/15/2021   Adrenal adenoma, right 04/15/2021   Trigger finger of left thumb and right ring finger 03/19/2021   Right foot pain 03/19/2021   Elevated cortisol level 01/13/2021   Type 2 diabetes mellitus with hyperglycemia, with long-term current use of insulin ([place]) 12/31/2020   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   Class 2 obesity due to excess calories without serious comorbidity with body mass index (BMI) of 36.0 to 36.9 in adult 01/22/2020   Low back pain radiating to both legs 10/27/2019   PVC's (premature ventricular contractions) 04/25/2019   Hematuria 04/18/2019   Primary osteoarthritis of right hip 04/18/2019   OSA (obstructive sleep apnea) 09/02/2018   NAFL (nonalcoholic fatty liver) 28/36/6294   De Quervain's  tenosynovitis, left 02/20/2016   Patchy loss of hair 03/02/2015   AMENORRHEA 04/17/2010   Hereditary and idiopathic peripheral neuropathy 10/22/2009   Essential hypertension 11/29/2008   PALPITATIONS, OCCASIONAL 04/12/2008   Diabetes mellitus type 2 with complications ([place]) 76/54/6503   VITAMIN D DEFICIENCY 01/10/2008   ALLERGIC RHINITIS 01/10/2008   ASTHMA, UNSPECIFIED, UNSPECIFIED STATUS 01/10/2008    Past Surgical History:  Procedure Laterality Date   ABDOMINAL HYSTERECTOMY  10/2010   Complete   APPENDECTOMY  07/1999   BREAST BIOPSY Left 02/1999   BREAST LUMPECTOMY WITH AXILLARY LYMPH NODE DISSECTION Left 10/2010   FOOT TENDON SURGERY  05/16/2012   bone spur.     LAPAROSCOPY  02/2007   for cyst and endometriosis    OB History   No obstetric history on file.      Home Medications    Prior to Admission medications   Medication Sig Start Date End Date Taking? Authorizing Provider  acetaminophen (TYLENOL) 325 MG tablet Take 650 mg by mouth 2 (two) times daily.    [provider]  albuterol (VENTOLIN HFA) 108 (90 Base) MCG/ACT inhaler TAKE 2 PUFFS BY MOUTH EVERY 6 HOURS AS NEEDED 06/09/21   Hali Marry, MD  Continuous Blood Gluc Receiver ([employer]) [place] by Does not apply route. 08/05/22   [provider]  doxycycline (VIBRAMYCIN) 100 MG capsule Take one cap PO Q12hr with food. 11/05/22  Yes Kandra Nicolas, MD  fluticasone (FLONASE) 50 MCG/ACT nasal spray Place 2 sprays into both  nostrils daily. 07/07/21 10/07/23  Terrilyn Saver, NP  insulin degludec (TRESIBA FLEXTOUCH) 200 UNIT/ML FlexTouch Pen Inject 82 Units into the skin in the morning and at bedtime. Patient taking differently: Inject 160 Units into the skin in the morning. 12/01/21   Shamleffer, Melanie Crazier, MD  insulin lispro (HUMALOG KWIKPEN) 100 UNIT/ML KwikPen Inject 5 Units into the skin daily with breakfast AND 10 Units daily with lunch AND 12 Units daily with supper. 12/01/21    Shamleffer, Melanie Crazier, MD  Insulin Pen Needle (B-D UF III MINI PEN NEEDLES) 31G X 5 MM MISC Inject 1 Device into the skin in the morning, at noon, in the evening, and at bedtime. 12/01/21   Shamleffer, Melanie Crazier, MD  meloxicam (MOBIC) 15 MG tablet TAKE 1 TABLET (15 MG TOTAL) BY MOUTH DAILY. 08/28/22   McDonald, Stephan Minister, DPM  metFORMIN (GLUCOPHAGE-XR) 500 MG 24 hr tablet Take 2 tablets (1,000 mg total) by mouth daily with breakfast AND 1 tablet (500 mg total) daily with supper. Patient taking differently: Take 2 tablets (1,000 mg total) by mouth daily with breakfast AND 2 tablets (1000 mg total) daily with supper. 12/01/21   Shamleffer, Melanie Crazier, MD  metoprolol succinate (TOPROL-XL) 50 MG 24 hr tablet TAKE 1 TABLET BY MOUTH EVERY DAY WITH OR IMMEDIATELY FOLLOWING A MEAL 09/03/22   Hali Marry, MD  mometasone-formoterol (DULERA) 200-5 MCG/ACT AERO Inhale 2 puffs into the lungs in the morning and at bedtime. 10/06/22   Hali Marry, MD  Multiple Vitamin (MULTIVITAMIN) tablet Take 1 tablet by mouth daily.    [provider]  Pitavastatin Calcium 2 MG TABS Take 1 tablet (2 mg total) by mouth at bedtime. 03/24/22   Hali Marry, MD  predniSONE (DELTASONE) 20 MG tablet Take one tab by mouth twice daily for 4 days, then one daily. Take with food. 11/05/22  Yes Kandra Nicolas, MD  valsartan (DIOVAN) 320 MG tablet Take 1 tablet (320 mg total) by mouth daily. 06/25/22   Hali Marry, MD    Family History Family History  Problem Relation Age of Onset   Other Mother        RA and CHF   Diabetes Father    Diabetes Other    Heart disease Other     Social History Social History   Tobacco Use   Smoking status: Every Day    Packs/day: 0.50    Types: Cigarettes   Smokeless tobacco: Never  Vaping Use   Vaping Use: Never used  Substance Use Topics   Alcohol use: No   Drug use: No     Allergies   Trulicity [dulaglutide], Crestor  [rosuvastatin], Hydrochlorothiazide, Jentadueto [linagliptin-metformin hcl er], Lipitor [atorvastatin], Onglyza [saxagliptin], Ozempic (0.25 or 0.5 mg-dose) [semaglutide(0.25 or 0.'5mg'$ -dos)], Valsartan, and Victoza [liraglutide]   Review of Systems Review of Systems + sore throat + cough No pleuritic pain + wheezing + mild nasal congestion ? post-nasal drainage No sinus pain/pressure No itchy/red eyes ? Right earache No hemoptysis No SOB No fever/chills No nausea No vomiting No abdominal pain No diarrhea No urinary symptoms No skin rash + fatigue No myalgias No headache   Physical Exam Triage Vital Signs ED Triage Vitals  Enc Vitals Group     BP 11/05/22 1448 138/79     Pulse Rate 11/05/22 1448 97     Resp 11/05/22 1448 17     Temp 11/05/22 1448 98.2 F (36.8 C)     Temp src --  SpO2 11/05/22 1448 96 %     Weight --      Height --      Head Circumference --      Peak Flow --      Pain Score 11/05/22 1445 0     Pain Loc --      Pain Edu? --      Excl. in [place]? --    No data found.  Updated Vital Signs BP 138/79 (BP Location: Right Arm)   Pulse 97   Temp 98.2 F (36.8 C)   Resp 17   SpO2 96%   Visual Acuity Right Eye Distance:   Left Eye Distance:   Bilateral Distance:    Right Eye Near:   Left Eye Near:    Bilateral Near:     Physical Exam Nursing notes and Vital Signs reviewed. Appearance:  Patient appears stated age, and in no acute distress Eyes:  Pupils are equal, round, and reactive to light and accomodation.  Extraocular movement is intact.  Conjunctivae are not inflamed  Ears:  Canals normal.  Tympanic membranes normal.  Nose:  Mildly congested turbinates.  No sinus tenderness.  Pharynx:  Normal Neck:  Supple.  Mildly enlarged lateral nodes are present, tender to palpation on the left.   Lungs: Bilateral scattered rhonchi.  Breath sounds are equal.  Moving air well. Heart:  Regular rate and rhythm without murmurs, rubs, or gallops.   Abdomen:  Nontender without masses or hepatosplenomegaly.  Bowel sounds are present.  No CVA or flank tenderness.  Extremities:  No edema.  Skin:  No rash present.   UC Treatments / Results  Labs (all labs ordered are listed, but only abnormal results are displayed) Labs Reviewed  SARS CORONAVIRUS 2 (TAT 6-24 HRS)    EKG   Radiology No results found.  Procedures Procedures (including critical care time)  Medications Ordered in UC Medications - No data to display  Initial Impression / Assessment and Plan / UC Course  I have reviewed the triage vital signs and the nursing notes.  Pertinent labs & imaging results that were available during my care of the patient were reviewed by me and considered in my medical decision making (see chart for details).    COVID PCR pending Begin doxycycline and prednisone burst/taper. Followup with Family Doctor if not improved in one week.   Final Clinical Impressions(s) / UC Diagnoses   Final diagnoses:  COPD exacerbation ([place])     Discharge Instructions      Take plain guaifenesin ('1200mg'$  extended release tabs such as Mucinex) twice daily, with plenty of water, for cough and congestion.  Get adequate rest.   For sinus congestion may use saline nasal spray several times daily and saline nasal irrigation (AYR is a common brand).    Try warm salt water gargles for sore throat.  Stop all antihistamines for now, and other non-prescription cough/cold preparations. May take Delsym Cough Suppressant ("12 Hour Cough Relief") at bedtime for nighttime cough.  Continue inhalers as prescribed.  If symptoms become significantly worse during the night or over the weekend, proceed to the local emergency room.       ED Prescriptions     Medication Sig Dispense Auth. Provider   doxycycline (VIBRAMYCIN) 100 MG capsule Take one cap PO Q12hr with food. 14 capsule Kandra Nicolas, MD   predniSONE (DELTASONE) 20 MG tablet Take one tab by mouth  twice daily for 4 days, then one daily. Take with food. 12  tablet Kandra Nicolas, MD         Kandra Nicolas, MD 11/07/22 [phone]

## 2022-11-05 NOTE — Discharge Instructions (Addendum)
Take plain guaifenesin ('1200mg'$  extended release tabs such as Mucinex) twice daily, with plenty of water, for cough and congestion.  Get adequate rest.   For sinus congestion may use saline nasal spray several times daily and saline nasal irrigation (AYR is a common brand).    Try warm salt water gargles for sore throat.  Stop all antihistamines for now, and other non-prescription cough/cold preparations. May take Delsym Cough Suppressant ("12 Hour Cough Relief") at bedtime for nighttime cough.  Continue inhalers as prescribed.  If symptoms become significantly worse during the night or over the weekend, proceed to the local emergency room.

## 2022-11-05 NOTE — ED Triage Notes (Signed)
Pt c/o cough x several weeks. Worsening in last week. Denies fever. Also having RT ear pain. Hx of asthma. Takes Dulera and albuterol prn. Also taking robitussin and mucinex prn. Grandson tested pos for RSV today.

## 2022-11-06 LAB — SARS CORONAVIRUS 2 (TAT 6-24 HRS): SARS Coronavirus 2: NEGATIVE

## 2022-11-11 DIAGNOSIS — E1165 Type 2 diabetes mellitus with hyperglycemia: Secondary | ICD-10-CM | POA: Diagnosis not present

## 2022-11-11 DIAGNOSIS — Z794 Long term (current) use of insulin: Secondary | ICD-10-CM | POA: Diagnosis not present

## 2022-11-21 ENCOUNTER — Other Ambulatory Visit: Payer: Self-pay | Admitting: Internal Medicine

## 2022-12-12 ENCOUNTER — Other Ambulatory Visit: Payer: Self-pay | Admitting: Internal Medicine

## 2022-12-12 DIAGNOSIS — E1165 Type 2 diabetes mellitus with hyperglycemia: Secondary | ICD-10-CM

## 2022-12-14 ENCOUNTER — Other Ambulatory Visit: Payer: Self-pay | Admitting: Family Medicine

## 2022-12-14 DIAGNOSIS — R053 Chronic cough: Secondary | ICD-10-CM

## 2023-01-01 ENCOUNTER — Encounter: Payer: Self-pay | Admitting: Podiatry

## 2023-01-01 ENCOUNTER — Ambulatory Visit (INDEPENDENT_AMBULATORY_CARE_PROVIDER_SITE_OTHER): Payer: BC Managed Care – PPO | Admitting: Podiatry

## 2023-01-01 ENCOUNTER — Ambulatory Visit (INDEPENDENT_AMBULATORY_CARE_PROVIDER_SITE_OTHER): Payer: BC Managed Care – PPO

## 2023-01-01 DIAGNOSIS — M19072 Primary osteoarthritis, left ankle and foot: Secondary | ICD-10-CM | POA: Diagnosis not present

## 2023-01-01 DIAGNOSIS — E1142 Type 2 diabetes mellitus with diabetic polyneuropathy: Secondary | ICD-10-CM

## 2023-01-01 DIAGNOSIS — M7732 Calcaneal spur, left foot: Secondary | ICD-10-CM | POA: Diagnosis not present

## 2023-01-01 DIAGNOSIS — M76822 Posterior tibial tendinitis, left leg: Secondary | ICD-10-CM

## 2023-01-01 DIAGNOSIS — M5416 Radiculopathy, lumbar region: Secondary | ICD-10-CM

## 2023-01-01 NOTE — Progress Notes (Signed)
  Subjective:  Patient ID: Terri Zimmerman, female    DOB: 08-Jun-1966,   MRN: [id number]  Chief Complaint  Patient presents with   Foot Pain    Constant left foot pain , patient states it's been on going for the past 4 months    57 y.o. female presents for concern of new problem with left foot and ankle pain that has been going on for about 4 months. Relates she has tried anti-inflammatories and has tried the PF brace she has for her right foot on her left and has not helped. States the right foot is doing better.  Does relate some bulging disks in her lower back. Denies any other pedal complaints. Denies n/v/f/c.   Past Medical History:  Diagnosis Date   Asthma 04-25-07   Diabetes mellitus 11-12-06   type 2   Hypertension 11-04-06    Objective:  Physical Exam: Vascular: DP/PT pulses 2/4 bilateral. CFT <3 seconds. Normal hair growth on digits. No edema.  Skin. No lacerations or abrasions bilateral feet.  Musculoskeletal: MMT 5/5 bilateral lower extremities in DF, PF, Inversion and Eversion. Deceased ROM in DF of ankle joint. Some tenderness to the PT tendon posterior to the medial malleolus. Pain with eversion of the foot and no pain with DF PF or inversion. No pain to plantar foot or lateral ankle or around achilles .Negative tinels sign.  Neurological: Sensation intact to light touch.   Assessment:   1. Posterior tibial tendon dysfunction (PTTD) of left lower extremity   2. Type 2 diabetes mellitus with diabetic polyneuropathy, unspecified whether long term insulin use ([place])   3. Lumbar radiculopathy      Plan:  Patient was evaluated and treated and all questions answered. X-rays reviewed and discussed with patient. No acute fractures or dislocations Discussed PTTD diagnosis vs possible radiculopathy and treatment options with patient. Stretching exercises discussed and handout dispensed. Discussed continuing with meloxicam at home.  Reviewed notes from PCP  Tri-Lock ankle brace  dispensed. Discussed if there is no improvement PT/MRI/injection may be an option. Did discuss that the pain she has going up and down her legs likely may be relates to her back as she relates tingling burning and a sensation of itchiness. Recommend discussing with PCP for further work-up Patient to return to clinic in 6 to 8 weeks or sooner if symptoms fail to improve or worsen.   Lorenda Peck, DPM

## 2023-01-01 NOTE — Patient Instructions (Signed)
Posterior Tibial Tendinitis Rehab Ask your health care provider which exercises are safe for you. Do exercises exactly as told by your health care provider and adjust them as directed. It is normal to feel mild stretching, pulling, tightness, or discomfort as you do these exercises. Stop right away if you feel sudden pain or your pain gets worse. Do not begin these exercises until told by your health care provider. Stretching and range-of-motion exercises These exercises warm up your muscles and joints and improve the movement and flexibility in your ankle and foot. These exercises may also help to relieve pain. Standing wall calf stretch, knee straight  Stand with your hands against a wall. Extend your left / right leg behind you, and bend your front knee slightly. If directed, place a folded washcloth under the arch of your foot for support. Point the toes of your back foot slightly inward. Keeping your heels on the floor and your back knee straight, shift your weight toward the wall. Do not allow your back to arch. You should feel a gentle stretch in your upper left / right calf. Hold this position for __________ seconds. Repeat __________ times. Complete this exercise __________ times a day. Standing wall calf stretch, knee bent Stand with your hands against a wall. Extend your left / right leg behind you, and bend your front knee slightly. If directed, place a folded washcloth under the arch of your foot for support. Point the toes of your back foot slightly inward. Unlock your back knee so it is bent. Keep your heels on the floor. You should feel a gentle stretch deep in your lower left / right calf. Hold this position for __________ seconds. Repeat __________ times. Complete this exercise __________ times a day. Strengthening exercises These exercises build strength and endurance in your ankle and foot. Endurance is the ability to use your muscles for a long time, even after they get  tired. Ankle inversion with band Secure one end of a rubber exercise band or tubing to a fixed object, such as a table leg or a pole, that will stay still when the band is pulled. Loop the other end of the band around the middle of your left / right foot. Sit on the floor facing the object with your left / right leg extended. The band or tube should be slightly tense when your foot is relaxed. Leading with your big toe, slowly bring your left / right foot and ankle inward, toward your other foot (inversion). Hold this position for __________ seconds. Slowly return your foot to the starting position. Repeat __________ times. Complete this exercise __________ times a day. Towel curls  Sit in a chair on a non-carpeted surface, and put your feet on the floor. Place a towel in front of your feet. If told by your health care provider, add a __________ weight to the end of the towel. Keeping your heel on the floor, put your left / right foot on the towel. Pull the towel toward you by grabbing the towel with your toes and curling them under. Keep your heel on the floor while you do this. Let your toes relax. Grab the towel with your toes again. Keep going until the towel is completely underneath your foot. Repeat __________ times. Complete this exercise __________ times a day. Balance exercise This exercise improves or maintains your balance. Balance is important in preventing falls. Single leg stand Without wearing shoes, stand near a railing or in a doorway. You may hold   on to the railing or door frame as needed for balance. Stand on your left / right foot. Keep your big toe down on the floor and try to keep your arch lifted. If balancing in this position is too easy, try the exercise with your eyes closed or while standing on a pillow. Hold this position for __________ seconds. Repeat __________ times. Complete this exercise __________ times a day. This information is not intended to replace  advice given to you by your health care provider. Make sure you discuss any questions you have with your health care provider. Document Revised: 02/28/2019 Document Reviewed: 01/04/2019 Elsevier Patient Education  2023 [employer] Inc.  

## 2023-01-04 ENCOUNTER — Other Ambulatory Visit: Payer: Self-pay | Admitting: Internal Medicine

## 2023-01-05 ENCOUNTER — Other Ambulatory Visit: Payer: Self-pay | Admitting: Podiatry

## 2023-01-05 ENCOUNTER — Other Ambulatory Visit: Payer: Self-pay

## 2023-01-05 MED ORDER — MELOXICAM 15 MG PO TABS: 15.0000 mg | ORAL_TABLET | Freq: Every day | ORAL | 0 refills | Status: DC

## 2023-01-05 NOTE — Telephone Encounter (Signed)
Refill sent; thanks

## 2023-01-07 ENCOUNTER — Ambulatory Visit: Payer: BC Managed Care – PPO | Admitting: Podiatry

## 2023-01-14 ENCOUNTER — Other Ambulatory Visit: Payer: Self-pay | Admitting: Family Medicine

## 2023-01-14 ENCOUNTER — Other Ambulatory Visit: Payer: Self-pay | Admitting: Internal Medicine

## 2023-01-26 NOTE — Telephone Encounter (Signed)
Pt called and stated that she would like to restart her CPAP. She stated that she has been really tired and falling asleep during the day.  She hasn't used her machine in over 2 years. She wanted to know if she would need an appointment to get this restarted.    Dr. Madilyn Fireman said that she should be able to turn the machine on and fill the water tank and start using the cpap.  Pt advised. She will call back if she has any issues.

## 2023-02-02 ENCOUNTER — Other Ambulatory Visit: Payer: Self-pay | Admitting: *Deleted

## 2023-02-02 DIAGNOSIS — G4733 Obstructive sleep apnea (adult) (pediatric): Secondary | ICD-10-CM

## 2023-02-02 MED ORDER — AMBULATORY NON FORMULARY MEDICATION: 0 refills | Status: DC

## 2023-02-02 MED ORDER — AMBULATORY NON FORMULARY MEDICATION: 0 refills | Status: AC

## 2023-02-10 DIAGNOSIS — E1165 Type 2 diabetes mellitus with hyperglycemia: Secondary | ICD-10-CM | POA: Diagnosis not present

## 2023-02-10 DIAGNOSIS — Z794 Long term (current) use of insulin: Secondary | ICD-10-CM | POA: Diagnosis not present

## 2023-02-10 DIAGNOSIS — Z1330 Encounter for screening examination for mental health and behavioral disorders, unspecified: Secondary | ICD-10-CM | POA: Diagnosis not present

## 2023-02-10 LAB — HEMOGLOBIN A1C: Hemoglobin A1C: 7.8

## 2023-02-11 ENCOUNTER — Ambulatory Visit (INDEPENDENT_AMBULATORY_CARE_PROVIDER_SITE_OTHER): Payer: BC Managed Care – PPO | Admitting: Podiatry

## 2023-02-11 ENCOUNTER — Encounter: Payer: Self-pay | Admitting: Podiatry

## 2023-02-11 DIAGNOSIS — E1142 Type 2 diabetes mellitus with diabetic polyneuropathy: Secondary | ICD-10-CM

## 2023-02-11 DIAGNOSIS — M76822 Posterior tibial tendinitis, left leg: Secondary | ICD-10-CM | POA: Diagnosis not present

## 2023-02-11 NOTE — Progress Notes (Signed)
  Subjective:  Patient ID: Terri Zimmerman, female    DOB: 06/08/1966,   MRN: [id number]  Chief Complaint  Patient presents with   Foot Pain    Patient came in today for left foot and ankle pain and swelling, patient states she is doing better, rate of pain 5 out of 10, patient is wearing the trilock brace and taking the mobic     57 y.o. female presents for follow-up of left PTTD. Relates she is doing better about 95% better. . She has been wearing the trilock and stretching and taking the mobic. Relates she does still have swelling and wondering about this.  Does relate some bulging disks in her lower back. Denies any other pedal complaints. Denies n/v/f/c.   Past Medical History:  Diagnosis Date   Asthma 04-25-07   Diabetes mellitus 11-12-06   type 2   Hypertension 11-04-06    Objective:  Physical Exam: Vascular: DP/PT pulses 2/4 bilateral. CFT <3 seconds. Normal hair growth on digits. No edema.  Skin. No lacerations or abrasions bilateral feet.  Musculoskeletal: MMT 5/5 bilateral lower extremities in DF, PF, Inversion and Eversion. Deceased ROM in DF of ankle joint. No tenderness to the PT tendon posterior to the medial malleolus.No pain with eversion of the foot and no pain with DF PF or inversion. No pain to plantar foot or lateral ankle or around achilles .Negative tinels sign.  Neurological: Sensation intact to light touch.   Assessment:   1. Posterior tibial tendon dysfunction (PTTD) of left lower extremity   2. Type 2 diabetes mellitus with diabetic polyneuropathy, unspecified whether long term insulin use ([place])       Plan:  Patient was evaluated and treated and all questions answered. X-rays reviewed and discussed with patient. No acute fractures or dislocations Discussed PTTD diagnosis vs possible radiculopathy and treatment options with patient. Continue stretching and brace as needed.  Feel this will continue to get better. Discussed wearing compression to help with the  swelling.   Discussed if there is no improvement or pain worsens PT/MRI/injection may be an option. Patient to return to clinic  as needed   Lorenda Peck, DPM

## 2023-03-05 ENCOUNTER — Ambulatory Visit: Payer: BC Managed Care – PPO | Admitting: Family Medicine

## 2023-03-09 ENCOUNTER — Other Ambulatory Visit: Payer: Self-pay | Admitting: Internal Medicine

## 2023-03-25 ENCOUNTER — Emergency Department (HOSPITAL_BASED_OUTPATIENT_CLINIC_OR_DEPARTMENT_OTHER): Payer: BC Managed Care – PPO

## 2023-03-25 ENCOUNTER — Encounter (HOSPITAL_BASED_OUTPATIENT_CLINIC_OR_DEPARTMENT_OTHER): Payer: Self-pay | Admitting: Emergency Medicine

## 2023-03-25 ENCOUNTER — Ambulatory Visit
Admission: EM | Admit: 2023-03-25 | Discharge: 2023-03-25 | Disposition: A | Discharge status: Home / Self Care | Payer: BC Managed Care – PPO | Attending: Family Medicine | Admitting: Family Medicine

## 2023-03-25 ENCOUNTER — Emergency Department (HOSPITAL_BASED_OUTPATIENT_CLINIC_OR_DEPARTMENT_OTHER)
Admission: EM | Admit: 2023-03-25 | Discharge: 2023-03-25 | Disposition: A | Discharge status: Home / Self Care | Payer: BC Managed Care – PPO | Attending: Emergency Medicine | Admitting: Emergency Medicine

## 2023-03-25 ENCOUNTER — Other Ambulatory Visit: Payer: Self-pay

## 2023-03-25 DIAGNOSIS — J189 Pneumonia, unspecified organism: Secondary | ICD-10-CM | POA: Insufficient documentation

## 2023-03-25 DIAGNOSIS — R110 Nausea: Secondary | ICD-10-CM

## 2023-03-25 DIAGNOSIS — R079 Chest pain, unspecified: Secondary | ICD-10-CM | POA: Diagnosis not present

## 2023-03-25 DIAGNOSIS — R0789 Other chest pain: Secondary | ICD-10-CM | POA: Diagnosis not present

## 2023-03-25 DIAGNOSIS — D72829 Elevated white blood cell count, unspecified: Secondary | ICD-10-CM | POA: Diagnosis not present

## 2023-03-25 DIAGNOSIS — R109 Unspecified abdominal pain: Secondary | ICD-10-CM | POA: Diagnosis not present

## 2023-03-25 DIAGNOSIS — R112 Nausea with vomiting, unspecified: Secondary | ICD-10-CM | POA: Diagnosis not present

## 2023-03-25 DIAGNOSIS — I700 Atherosclerosis of aorta: Secondary | ICD-10-CM | POA: Diagnosis not present

## 2023-03-25 DIAGNOSIS — R1011 Right upper quadrant pain: Secondary | ICD-10-CM

## 2023-03-25 DIAGNOSIS — M79662 Pain in left lower leg: Secondary | ICD-10-CM | POA: Diagnosis not present

## 2023-03-25 DIAGNOSIS — K760 Fatty (change of) liver, not elsewhere classified: Secondary | ICD-10-CM | POA: Diagnosis not present

## 2023-03-25 LAB — COMPREHENSIVE METABOLIC PANEL
ALT: 17 U/L (ref 0–44)
AST: 17 U/L (ref 15–41)
Albumin: 3.6 g/dL (ref 3.5–5.0)
Alkaline Phosphatase: 89 U/L (ref 38–126)
Anion gap: 8 (ref 5–15)
BUN: 6 mg/dL (ref 6–20)
CO2: 29 mmol/L (ref 22–32)
Calcium: 9.3 mg/dL (ref 8.9–10.3)
Chloride: 102 mmol/L (ref 98–111)
Creatinine, Ser: 0.8 mg/dL (ref 0.44–1.00)
GFR, Estimated: >60 mL/min (ref >60)
Glucose, Bld: 211 mg/dL — ABNORMAL HIGH (ref 70–99)
Potassium: 4 mmol/L (ref 3.5–5.1)
Sodium: 139 mmol/L (ref 135–145)
Total Bilirubin: 0.4 mg/dL (ref 0.3–1.2)
Total Protein: 7.3 g/dL (ref 6.5–8.1)

## 2023-03-25 LAB — URINALYSIS, ROUTINE W REFLEX MICROSCOPIC
Bilirubin Urine: NEGATIVE
Glucose, UA: 100 mg/dL — AB
Hgb urine dipstick: NEGATIVE
Ketones, ur: NEGATIVE mg/dL
Leukocytes,Ua: NEGATIVE
Nitrite: NEGATIVE
Protein, ur: NEGATIVE mg/dL
Specific Gravity, Urine: 1.02 (ref 1.005–1.030)
pH: 6.5 (ref 5.0–8.0)

## 2023-03-25 LAB — POCT URINALYSIS DIP (MANUAL ENTRY)
Bilirubin, UA: NEGATIVE
Blood, UA: NEGATIVE
Glucose, UA: NEGATIVE mg/dL
Ketones, POC UA: NEGATIVE mg/dL
Leukocytes, UA: NEGATIVE
Nitrite, UA: NEGATIVE
Protein Ur, POC: NEGATIVE mg/dL
Spec Grav, UA: 1.025 (ref 1.010–1.025)
Urobilinogen, UA: 0.2 E.U./dL
pH, UA: 7 (ref 5.0–8.0)

## 2023-03-25 LAB — CBC
HCT: 43 % (ref 36.0–46.0)
Hemoglobin: 13.4 g/dL (ref 12.0–15.0)
MCH: 26.2 pg (ref 26.0–34.0)
MCHC: 31.2 g/dL (ref 30.0–36.0)
MCV: 84 fL (ref 80.0–100.0)
Platelets: 287 10*3/uL (ref 150–400)
RBC: 5.12 MIL/uL — ABNORMAL HIGH (ref 3.87–5.11)
RDW: 14.3 % (ref 11.5–15.5)
WBC: 16.3 10*3/uL — ABNORMAL HIGH (ref 4.0–10.5)
nRBC: 0 % (ref 0.0–0.2)

## 2023-03-25 LAB — OCCULT BLOOD X 1 CARD TO LAB, STOOL: Fecal Occult Bld: NEGATIVE

## 2023-03-25 LAB — D-DIMER, QUANTITATIVE: D-Dimer, Quant: 0.3 ug/mL-FEU (ref 0.00–0.50)

## 2023-03-25 LAB — LIPASE, BLOOD: Lipase: 47 U/L (ref 11–51)

## 2023-03-25 LAB — TROPONIN I (HIGH SENSITIVITY)
Troponin I (High Sensitivity): 3 ng/L (ref <18)
Troponin I (High Sensitivity): 3 ng/L (ref <18)

## 2023-03-25 MED ORDER — IOHEXOL 350 MG/ML SOLN
100.0000 mL | Freq: Once | INTRAVENOUS | Status: AC | PRN
  Administered 2023-03-25: 100 mL via INTRAVENOUS

## 2023-03-25 MED ORDER — AMOXICILLIN-POT CLAVULANATE 875-125 MG PO TABS
1.0000 | ORAL_TABLET | Freq: Once | ORAL | Status: AC
  Administered 2023-03-25: 1 via ORAL
  Filled 2023-03-25: qty 1

## 2023-03-25 MED ORDER — KETOROLAC TROMETHAMINE 15 MG/ML IJ SOLN
15.0000 mg | Freq: Once | INTRAMUSCULAR | Status: AC
  Administered 2023-03-25: 15 mg via INTRAVENOUS
  Filled 2023-03-25: qty 1

## 2023-03-25 MED ORDER — AMOXICILLIN-POT CLAVULANATE 875-125 MG PO TABS: 1.0000 | ORAL_TABLET | Freq: Two times a day (BID) | ORAL | 0 refills | Status: AC

## 2023-03-25 MED ORDER — ONDANSETRON 4 MG PO TBDP: 4.0000 mg | ORAL_TABLET | Freq: Three times a day (TID) | ORAL | 0 refills | Status: DC | PRN

## 2023-03-25 MED ORDER — DOXYCYCLINE HYCLATE 100 MG PO CAPS: 100.0000 mg | ORAL_CAPSULE | Freq: Two times a day (BID) | ORAL | 0 refills | Status: AC

## 2023-03-25 MED ORDER — SODIUM CHLORIDE 0.9 % IV BOLUS
1000.0000 mL | Freq: Once | INTRAVENOUS | Status: AC
  Administered 2023-03-25: 1000 mL via INTRAVENOUS

## 2023-03-25 MED ORDER — AZITHROMYCIN 250 MG PO TABS
500.0000 mg | ORAL_TABLET | Freq: Once | ORAL | Status: AC
  Administered 2023-03-25: 500 mg via ORAL
  Filled 2023-03-25: qty 2

## 2023-03-25 MED ORDER — MORPHINE SULFATE (PF) 4 MG/ML IV SOLN
4.0000 mg | Freq: Once | INTRAVENOUS | Status: AC
  Administered 2023-03-25: 4 mg via INTRAVENOUS
  Filled 2023-03-25: qty 1

## 2023-03-25 MED ORDER — ONDANSETRON 8 MG PO TBDP
8.0000 mg | ORAL_TABLET | Freq: Once | ORAL | Status: AC
  Administered 2023-03-25: 8 mg via ORAL

## 2023-03-25 MED ORDER — METOCLOPRAMIDE HCL 5 MG/ML IJ SOLN
10.0000 mg | Freq: Once | INTRAMUSCULAR | Status: AC
  Administered 2023-03-25: 10 mg via INTRAVENOUS
  Filled 2023-03-25: qty 2

## 2023-03-25 MED ORDER — HYDROMORPHONE HCL 1 MG/ML IJ SOLN
1.0000 mg | Freq: Once | INTRAMUSCULAR | Status: AC
  Administered 2023-03-25: 1 mg via INTRAVENOUS
  Filled 2023-03-25: qty 1

## 2023-03-25 MED ORDER — ONDANSETRON HCL 4 MG/2ML IJ SOLN
4.0000 mg | Freq: Once | INTRAMUSCULAR | Status: AC
  Administered 2023-03-25: 4 mg via INTRAVENOUS
  Filled 2023-03-25: qty 2

## 2023-03-25 NOTE — ED Notes (Signed)
Patient is being discharged from the Urgent Care and sent to the Emergency Department via POV. Per Trevor Iha, FNP-C, patient is in need of higher level of care due to RUQ abd pain. Patient is aware and verbalizes understanding of plan of care.  Vitals:   03/25/23 0827  BP: 133/82  Pulse: 95  Resp: 19  Temp: 97.8 F (36.6 C)  SpO2: 93%

## 2023-03-25 NOTE — Discharge Instructions (Addendum)
Instructed patient to go to [facility] ED now for further evaluation of right upper quadrant abdominal pain.

## 2023-03-25 NOTE — ED Notes (Signed)

## 2023-03-25 NOTE — ED Provider Notes (Signed)
  Physical Exam  BP 130/79   Pulse 96   Temp 98.8 F (37.1 C) (Oral)   Resp (!) 22   Ht 5\' 1"  (1.549 m)   Wt 87.5 kg   SpO2 95%   BMI 36.47 kg/m   Physical Exam  Procedures  Procedures  ED Course / MDM   Clinical Course as of 03/26/23 1308  Thu Mar 25, 2023  1138 Patient instantly she declined pain medicine but now stating that she is hurting more, primarily in her left chest.  It is pleuritic but it also hurts when she moves but is not reproducible.  She does indicate that she has had some left calf pain on and off and her left ankle has been swollen for about a month.  Will get a DVT ultrasound in addition to her right upper quadrant ultrasound and add on a D-dimer. [SG]  1320 Patient is continuing to complain about chest and back pain.  While think dissection is less likely, I think she will need a CT of at least her abdomen due to the continued abdominal pain with no clear cause (right upper quadrant ultrasound unremarkable).  Given the continued chest/back pain will make it a dissection study though I do think dissection is less likely. [SG]  1540 Assumed care from Dr Criss Alvine. Presented with chest pain. Has had some vomiting as well.  EKG and serial trops WNL.  D-dimer negative.  Did have a dissection study that showed possible multifocal pneumonia and does have mild elevation in her white blood cell count so was given Augmentin which she will be sent home with.  Had a right upper quadrant ultrasound that did not show evidence of cholecystitis.  Did have some vomiting and will attempt to get her nausea under control prior to discharge. [RP]   Patient reassessed in the emergency department.  Remained stable on room air.  Was able to tolerate p.o.  And chest pain had markedly improved.  Suspect that she is having pleurisy from her pneumonia.  Was given antibiotics for her multifocal pneumonia which she was discharged home with.  Discharged home with instructions to follow-up with her  primary doctor in several days.  Clinical Course User Index [RP] Rondel Baton, MD [SG] Pricilla Loveless, MD   Medical Decision Making Amount and/or Complexity of Data Reviewed Labs: ordered. Radiology: ordered.  Risk Prescription drug management.      Rondel Baton, MD 03/26/23 1309

## 2023-03-25 NOTE — ED Provider Notes (Signed)
[place] [facility] Provider Note   CSN: [id number] Arrival date & time: 03/25/23  4098     History  Chief Complaint  Patient presents with   Chest Pain    Terri Zimmerman is a 57 y.o. female.  HPI 57 year old female presents with chest pain and abdominal pain. About 3 days ago started having nausea. Has taken Pepto-Bismol but it's not helping.  Over the last 2 days she has had black stools.  No diarrhea.  She is having some right-sided abdominal pain.  Last night around midnight she started developing chest pain in her left chest that goes to her back.  Went to urgent care and was sent here.  Pain is moderate.  She had some nausea but went away when they gave her some medicine. No significant NSAID use or history of prior gastritis/ulcers. Has a colonoscopy a couple months ago that was ok except for some polyps.   Home Medications Prior to Admission medications   Medication Sig Start Date End Date Taking? Authorizing Provider  acetaminophen (TYLENOL) 325 MG tablet Take 650 mg by mouth 2 (two) times daily.    [provider]  albuterol (VENTOLIN HFA) 108 (90 Base) MCG/ACT inhaler TAKE 2 PUFFS BY MOUTH EVERY 6 HOURS AS NEEDED 06/09/21   Agapito Games, MD  AMBULATORY NON FORMULARY MEDICATION Medication Name: CPAP supplies hose, humidifier  Dx OSA with AHI 99. [employer]: [phone] 02/02/23   Agapito Games, MD  Continuous Blood Gluc Receiver (DEXCOM G7 RECEIVER) DEVI by Does not apply route. 08/05/22   [provider]  doxycycline (VIBRAMYCIN) 100 MG capsule Take one cap PO Q12hr with food. 11/05/22   Lattie Haw, MD  fluticasone (FLONASE) 50 MCG/ACT nasal spray Place 2 sprays into both nostrils daily. 07/07/21 10/07/23  Clayborne Dana, NP  insulin degludec (TRESIBA FLEXTOUCH) 200 UNIT/ML FlexTouch Pen Inject 82 Units into the skin in the morning and at bedtime. Patient taking differently: Inject 160 Units into the  skin in the morning. 12/01/21   Shamleffer, Konrad Dolores, MD  insulin lispro (HUMALOG KWIKPEN) 100 UNIT/ML KwikPen Inject 5 Units into the skin daily with breakfast AND 10 Units daily with lunch AND 12 Units daily with supper. 12/01/21   Shamleffer, Konrad Dolores, MD  Insulin Pen Needle (B-D UF III MINI PEN NEEDLES) 31G X 5 MM MISC Inject 1 Device into the skin in the morning, at noon, in the evening, and at bedtime. 12/01/21   Shamleffer, Konrad Dolores, MD  meloxicam (MOBIC) 15 MG tablet Take 1 tablet (15 mg total) by mouth daily. 01/05/23   Louann Sjogren, DPM  metFORMIN (GLUCOPHAGE-XR) 500 MG 24 hr tablet Take 2 tablets (1,000 mg total) by mouth daily with breakfast AND 1 tablet (500 mg total) daily with supper. Patient taking differently: Take 2 tablets (1,000 mg total) by mouth daily with breakfast AND 2 tablets (1000 mg total) daily with supper. 12/01/21   Shamleffer, Konrad Dolores, MD  metoprolol succinate (TOPROL-XL) 50 MG 24 hr tablet TAKE 1 TABLET BY MOUTH EVERY DAY WITH OR IMMEDIATELY FOLLOWING A MEAL 01/15/23   Agapito Games, MD  mometasone-formoterol [facility]) 200-5 MCG/ACT AERO Inhale 2 puffs into the lungs in the morning and at bedtime. 10/06/22   Agapito Games, MD  Multiple Vitamin (MULTIVITAMIN) tablet Take 1 tablet by mouth daily.    [provider]  Pitavastatin Calcium 2 MG TABS Take 1 tablet (2 mg total) by mouth at  bedtime. 03/24/22   Agapito Games, MD  predniSONE (DELTASONE) 20 MG tablet Take one tab by mouth twice daily for 4 days, then one daily. Take with food. 11/05/22   Lattie Haw, MD  valsartan (DIOVAN) 320 MG tablet TAKE 1 TABLET BY MOUTH EVERY DAY 12/14/22   Agapito Games, MD      Allergies    Trulicity [dulaglutide], Crestor [rosuvastatin], Hydrochlorothiazide, Jentadueto [linagliptin-metformin hcl er], Lipitor [atorvastatin], Onglyza [saxagliptin], Ozempic (0.25 or 0.5 mg-dose) [semaglutide(0.25 or 0.5mg -dos)], Valsartan,  and Victoza [liraglutide]    Review of Systems   Review of Systems  Constitutional:  Negative for fever.  Respiratory:  Positive for shortness of breath.   Cardiovascular:  Positive for chest pain.  Gastrointestinal:  Positive for abdominal pain and nausea. Negative for vomiting.  Musculoskeletal:  Positive for back pain.    Physical Exam Updated Vital Signs BP 130/79   Pulse 96   Temp 98.8 F (37.1 C) (Oral)   Resp (!) 22   Ht 5\' 1"  (1.549 m)   Wt 87.5 kg   SpO2 95%   BMI 36.47 kg/m  Physical Exam Vitals and nursing note reviewed. Exam conducted with a chaperone present.  Constitutional:      General: She is not in acute distress.    Appearance: She is well-developed. She is obese. She is not ill-appearing or diaphoretic.  HENT:     Head: Normocephalic and atraumatic.  Cardiovascular:     Rate and Rhythm: Normal rate and regular rhythm.     Heart sounds: Normal heart sounds.  Pulmonary:     Effort: Pulmonary effort is normal.     Breath sounds: Normal breath sounds.  Abdominal:     Palpations: Abdomen is soft.     Tenderness: There is abdominal tenderness in the right upper quadrant and epigastric area.  Genitourinary:    Rectum: No mass, tenderness, anal fissure or external hemorrhoid.     Comments: Minimal stool on DRE. No black stool/melena Skin:    General: Skin is warm and dry.  Neurological:     Mental Status: She is alert.     ED Results / Procedures / Treatments   Labs (all labs ordered are listed, but only abnormal results are displayed) Labs Reviewed  CBC - Abnormal; Notable for the following components:      Result Value   WBC 16.3 (*)    RBC 5.12 (*)    All other components within normal limits  COMPREHENSIVE METABOLIC PANEL - Abnormal; Notable for the following components:   Glucose, Bld 211 (*)    All other components within normal limits  URINALYSIS, ROUTINE W REFLEX MICROSCOPIC - Abnormal; Notable for the following components:   Glucose,  UA 100 (*)    All other components within normal limits  LIPASE, BLOOD  D-DIMER, QUANTITATIVE  OCCULT BLOOD X 1 CARD TO LAB, STOOL  TROPONIN I (HIGH SENSITIVITY)  TROPONIN I (HIGH SENSITIVITY)    EKG EKG Interpretation  Date/Time:  Thursday Mar 25 2023 09:41:09 EDT Ventricular Rate:  97 PR Interval:  145 QRS Duration: 95 QT Interval:  345 QTC Calculation: 439 R Axis:   -68 Text Interpretation: Sinus rhythm Left anterior fascicular block no significant change since earlier in the day or Aug 2023 Confirmed by Pricilla Loveless [phone]) on 03/25/2023 10:30:21 AM  Radiology CT Angio Chest/Abd/Pel for Dissection W and/or Wo Contrast  Result Date: 03/25/2023 CLINICAL DATA:  Chest and back pain. EXAM: CT ANGIOGRAPHY CHEST, ABDOMEN AND PELVIS  TECHNIQUE: Non-contrast CT of the chest was initially obtained. Multidetector CT imaging through the chest, abdomen and pelvis was performed using the standard protocol during bolus administration of intravenous contrast. Multiplanar reconstructed images and MIPs were obtained and reviewed to evaluate the vascular anatomy. RADIATION DOSE REDUCTION: This exam was performed according to the departmental dose-optimization program which includes automated exposure control, adjustment of the mA and/or kV according to patient size and/or use of iterative reconstruction technique. CONTRAST:  OMNIPAQUE IOHEXOL 350 MG/ML SOLN COMPARISON:  June 19, 2022.  May 24, 2021. FINDINGS: CTA CHEST FINDINGS Cardiovascular: Preferential opacification of the thoracic aorta. No evidence of thoracic aortic aneurysm or dissection. Normal heart size. No pericardial effusion. Mediastinum/Nodes: No enlarged mediastinal, hilar, or axillary lymph nodes. Thyroid gland, trachea, and esophagus demonstrate no significant findings. Lungs/Pleura: No pneumothorax or pleural effusion is noted. Stable 4 mm nodule is noted in left lower lobe best seen in image number 42 of series 8 which can be  considered benign at this point. Interval development of multi patchy airspace opacities bilaterally, most prominently seen in right middle lobe and left upper lobe, concerning for multifocal pneumonia. Musculoskeletal: No chest wall abnormality. No acute or significant osseous findings. Review of the MIP images confirms the above findings. CTA ABDOMEN AND PELVIS FINDINGS VASCULAR Aorta: Atherosclerosis of abdominal aorta is noted without aneurysm or dissection. Celiac: Patent without evidence of aneurysm, dissection, vasculitis or significant stenosis. SMA: Patent without evidence of aneurysm, dissection, vasculitis or significant stenosis. Renals: Both renal arteries are patent without evidence of aneurysm, dissection, vasculitis, fibromuscular dysplasia or significant stenosis. IMA: Patent without evidence of aneurysm, dissection, vasculitis or significant stenosis. Inflow: Patent without evidence of aneurysm, dissection, vasculitis or significant stenosis. Veins: No obvious venous abnormality within the limitations of this arterial phase study. Review of the MIP images confirms the above findings. NON-VASCULAR Hepatobiliary: No cholelithiasis or biliary dilatation is noted. Hepatic steatosis. Pancreas: Unremarkable. No pancreatic ductal dilatation or surrounding inflammatory changes. Spleen: Normal in size without focal abnormality. Adrenals/Urinary Tract: Grossly stable 3.6 cm right adrenal adenoma. Left adrenal gland is unremarkable. No hydronephrosis or renal obstruction is noted. Urinary bladder is unremarkable. Stomach/Bowel: Stomach is unremarkable. There is no evidence of bowel obstruction or inflammation. Status post appendectomy. Lymphatic: No adenopathy is noted. Reproductive: Status post hysterectomy. No adnexal masses. Other: No abdominal wall hernia or abnormality. No abdominopelvic ascites. Musculoskeletal: No acute or significant osseous findings. Review of the MIP images confirms the above  findings. IMPRESSION: No evidence of thoracic or abdominal aortic dissection or aneurysm. Multiple small focal patchy airspace opacities are noted bilaterally within the lungs most consistent with multifocal pneumonia, but follow-up chest CT in 2-3 weeks is recommended to ensure resolution and rule out underlying neoplasm or metastatic disease. Hepatic steatosis. Stable right adrenal adenoma. Aortic Atherosclerosis (ICD10-I70.0). Electronically Signed   By: Lupita Raider M.D.   On: 03/25/2023 14:59   US Abdomen Limited RUQ (LIVER/GB)  Result Date: 03/25/2023 CLINICAL DATA:  Right-sided abdominal pain EXAM: ULTRASOUND ABDOMEN LIMITED RIGHT UPPER QUADRANT COMPARISON:  CT 06/19/2022 FINDINGS: Gallbladder: No gallstones or wall thickening visualized. No sonographic Murphy sign noted by sonographer. Common bile duct: Diameter: 2 mm Liver: Diffusely echogenic hepatic parenchyma consistent with fatty liver infiltration. With this level of echogenicity evaluation for underlying mass lesion is limited and if needed follow-up contrast CT or MRI as clinically directed. Portal vein is patent on color Doppler imaging with normal direction of blood flow towards the liver. Other: None. IMPRESSION: Fatty  liver infiltration.  No gallstones or ductal dilatation Electronically Signed   By: Karen Kays M.D.   On: 03/25/2023 13:08   US Venous Img Lower Unilateral Left  Result Date: 03/25/2023 CLINICAL DATA:  Left lower extremity pain. EXAM: LEFT LOWER EXTREMITY VENOUS DOPPLER ULTRASOUND TECHNIQUE: Gray-scale sonography with graded compression, as well as color Doppler and duplex ultrasound were performed to evaluate the lower extremity deep venous systems from the level of the common femoral vein and including the common femoral, femoral, profunda femoral, popliteal and calf veins including the posterior tibial, peroneal and gastrocnemius veins when visible. The superficial great saphenous vein was also interrogated. Spectral  Doppler was utilized to evaluate flow at rest and with distal augmentation maneuvers in the common femoral, femoral and popliteal veins. COMPARISON:  None Available. FINDINGS: Contralateral Common Femoral Vein: Respiratory phasicity is normal and symmetric with the symptomatic side. No evidence of thrombus. Normal compressibility. Common Femoral Vein: No evidence of thrombus. Normal compressibility, respiratory phasicity and response to augmentation. Saphenofemoral Junction: No evidence of thrombus. Normal compressibility and flow on color Doppler imaging. Profunda Femoral Vein: No evidence of thrombus. Normal compressibility and flow on color Doppler imaging. Femoral Vein: No evidence of thrombus. Normal compressibility, respiratory phasicity and response to augmentation. Popliteal Vein: No evidence of thrombus. Normal compressibility, respiratory phasicity and response to augmentation. Calf Veins: No evidence of thrombus. Normal compressibility and flow on color Doppler imaging. Superficial Great Saphenous Vein: No evidence of thrombus. Normal compressibility. Venous Reflux:  None. Other Findings: Well-circumscribed and oval shaped area relative hypoechoic soft tissue in the subcutaneous fat of the anterior upper thigh measures roughly 3.8 x 0.7 x 2.5 cm. This is nonspecific. This may relate to prior injury. This does not have the appearance fluid. IMPRESSION: 1. No evidence of left lower extremity deep vein thrombosis. 2. Well-circumscribed and oval shaped area of relative hypoechoic soft tissue in the subcutaneous fat of the anterior upper thigh. This is nonspecific but may relate to prior injury. Electronically Signed   By: Irish Lack M.D.   On: 03/25/2023 12:50   DG Chest 2 View  Result Date: 03/25/2023 CLINICAL DATA:  Chest pain EXAM: CHEST - 2 VIEW COMPARISON:  CXR 04/21/22 FINDINGS: The heart size and mediastinal contours are within normal limits. Both lungs are clear. The visualized skeletal  structures are unremarkable. IMPRESSION: No focal airspace opacity. Electronically Signed   By: Lorenza Cambridge M.D.   On: 03/25/2023 10:25    Procedures Procedures    Medications Ordered in ED Medications  amoxicillin-clavulanate (AUGMENTIN) 875-125 MG per tablet 1 tablet (has no administration in time range)  azithromycin (ZITHROMAX) tablet 500 mg (has no administration in time range)  sodium chloride 0.9 % bolus 1,000 mL ( Intravenous Stopped 03/25/23 1236)  morphine (PF) 4 MG/ML injection 4 mg (4 mg Intravenous Given 03/25/23 1155)  HYDROmorphone (DILAUDID) injection 1 mg (1 mg Intravenous Given 03/25/23 1501)  ketorolac (TORADOL) 15 MG/ML injection 15 mg (15 mg Intravenous Given 03/25/23 1501)  iohexol (OMNIPAQUE) 350 MG/ML injection 100 mL (100 mLs Intravenous Contrast Given 03/25/23 1424)    ED Course/ Medical Decision Making/ A&P Clinical Course as of 03/25/23 1550  Thu Mar 25, 2023  1138 Patient instantly she declined pain medicine but now stating that she is hurting more, primarily in her left chest.  It is pleuritic but it also hurts when she moves but is not reproducible.  She does indicate that she has had some left calf pain on and off  and her left ankle has been swollen for about a month.  Will get a DVT ultrasound in addition to her right upper quadrant ultrasound and add on a D-dimer. [SG]  1320 Patient is continuing to complain about chest and back pain.  While think dissection is less likely, I think she will need a CT of at least her abdomen due to the continued abdominal pain with no clear cause (right upper quadrant ultrasound unremarkable).  Given the continued chest/back pain will make it a dissection study though I do think dissection is less likely. [SG]       Clinical Course User Index [RP] Rondel Baton, MD [SG] Pricilla Loveless, MD                             Medical Decision Making Amount and/or Complexity of Data Reviewed Labs: ordered.    Details: Leukocytosis  but negative D-dimer, negative troponins, unremarkable LFTs. Radiology: ordered and independent interpretation performed.    Details: No cholecystitis on ultrasound.  CT with small patches of possible pneumonia, abdominal CT otherwise benign. ECG/medicine tests: ordered and independent interpretation performed.    Details: No acute ischemia.  Risk Prescription drug management.   Patient's chest pain is probably coming from these areas of pneumonia.  She did not have much relief with morphine and so she was given some Toradol and Dilaudid as well.  Overall her vitals are unremarkable besides some soft but normal oxygenation.  Heart rate has been in the low 90s.  With a negative D-dimer and no DVT on ultrasound I think PE is pretty unlikely.  Fortunately there is no dissection or other acute intra-abdominal emergency.    However given the degree of pain she had despite some IV morphine she was given IV Toradol and Dilaudid.  However I think this ended up being too strong for her as she developed some vomiting afterwards.  Care transferred to Dr. Eloise Harman, she will need reevaluation and treatment for pneumonia.        Final Clinical Impression(s) / ED Diagnoses Final diagnoses:  Multifocal pneumonia    Rx / DC Orders ED Discharge Orders     None         Pricilla Loveless, MD 03/25/23 1551

## 2023-03-25 NOTE — Discharge Instructions (Signed)
You were seen for your pneumonia and vomiting in the emergency department.   At home, please take the antibiotics (doxycycline and Augmentin) and the nausea medication (Zofran).    Follow-up with your primary doctor in 2-3 days regarding your visit.    Return immediately to the emergency department if you experience any of the following: Worsening pain, difficulty breathing, or any other concerning symptoms.    Thank you for visiting our Emergency Department. It was a pleasure taking care of you today.

## 2023-03-25 NOTE — ED Provider Notes (Addendum)
[facility]    CSN: [id number] Arrival date & time: 03/25/23  0815      History   Chief Complaint Chief Complaint  Patient presents with   Abdominal Pain   Nausea   Chest Pain   Shortness of Breath   Melena    HPI Terri Zimmerman is a 57 y.o. female.   HPI 57 year old female presents with nausea for 1 week right-sided upper abdominal pain and new onset left chest pain to left back pain with left arm weakness that began yesterday.  Additionally, patient reports chills, cold sweats and black tarry stools.  PMH significant for polyarthralgia, T2DM, and HTN.  Patient is a current daily cigarette smoker.  Past Medical History:  Diagnosis Date   Asthma 04-25-07   Diabetes mellitus 11-12-06   type 2   Hypertension 11-04-06    Patient Active Problem List   Diagnosis Date Noted   Primary osteoarthritis of right knee 07/30/2021   Aortic atherosclerosis (HCC) 05/26/2021   History of COVID-19 05/16/2021   Tobacco dependence 05/16/2021   Recurrent non-productive cough 05/09/2021   Lymphocytosis 05/09/2021   Abnormal QT interval present on electrocardiogram 05/09/2021   Left leg swelling 04/24/2021   Irregular heart rhythm 04/23/2021   Cough, persistent 04/23/2021   Polyarthralgia 04/15/2021   Adrenal adenoma, right 04/15/2021   Trigger finger of left thumb and right ring finger 03/19/2021   Right foot pain 03/19/2021   Elevated cortisol level 01/13/2021   Type 2 diabetes mellitus with hyperglycemia, with long-term current use of insulin (HCC) 12/31/2020   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   Class 2 obesity due to excess calories without serious comorbidity with body mass index (BMI) of 36.0 to 36.9 in adult 01/22/2020   Low back pain radiating to both legs 10/27/2019   PVC's (premature ventricular contractions) 04/25/2019   Hematuria 04/18/2019   Primary osteoarthritis of right hip 04/18/2019   OSA (obstructive sleep apnea) 09/02/2018   NAFL (nonalcoholic fatty  liver) 06/30/2016   De Quervain's tenosynovitis, left 02/20/2016   Patchy loss of hair 03/02/2015   AMENORRHEA 04/17/2010   Hereditary and idiopathic peripheral neuropathy 10/22/2009   Essential hypertension 11/29/2008   PALPITATIONS, OCCASIONAL 04/12/2008   Diabetes mellitus type 2 with complications (HCC) 01/10/2008   VITAMIN D DEFICIENCY 01/10/2008   ALLERGIC RHINITIS 01/10/2008   ASTHMA, UNSPECIFIED, UNSPECIFIED STATUS 01/10/2008    Past Surgical History:  Procedure Laterality Date   ABDOMINAL HYSTERECTOMY  10/2010   Complete   APPENDECTOMY  07/1999   BREAST BIOPSY Left 02/1999   BREAST LUMPECTOMY WITH AXILLARY LYMPH NODE DISSECTION Left 10/2010   FOOT TENDON SURGERY  05/16/2012   bone spur.     LAPAROSCOPY  02/2007   for cyst and endometriosis    OB History   No obstetric history on file.      Home Medications    Prior to Admission medications   Medication Sig Start Date End Date Taking? Authorizing Provider  acetaminophen (TYLENOL) 325 MG tablet Take 650 mg by mouth 2 (two) times daily.    [provider]  albuterol (VENTOLIN HFA) 108 (90 Base) MCG/ACT inhaler TAKE 2 PUFFS BY MOUTH EVERY 6 HOURS AS NEEDED 06/09/21   Agapito Games, MD  AMBULATORY NON FORMULARY MEDICATION Medication Name: CPAP supplies hose, humidifier  Dx OSA with AHI 99. [employer]: [phone] 02/02/23   Agapito Games, MD  Continuous Blood Gluc Receiver (DEXCOM G7 RECEIVER) DEVI by Does not apply route. 08/05/22   [provider]  doxycycline (VIBRAMYCIN) 100 MG capsule Take one cap PO Q12hr with food. 11/05/22   Lattie Haw, MD  fluticasone (FLONASE) 50 MCG/ACT nasal spray Place 2 sprays into both nostrils daily. 07/07/21 10/07/23  Clayborne Dana, NP  insulin degludec (TRESIBA FLEXTOUCH) 200 UNIT/ML FlexTouch Pen Inject 82 Units into the skin in the morning and at bedtime. Patient taking differently: Inject 160 Units into the skin in the morning. 12/01/21    Shamleffer, Konrad Dolores, MD  insulin lispro (HUMALOG KWIKPEN) 100 UNIT/ML KwikPen Inject 5 Units into the skin daily with breakfast AND 10 Units daily with lunch AND 12 Units daily with supper. 12/01/21   Shamleffer, Konrad Dolores, MD  Insulin Pen Needle (B-D UF III MINI PEN NEEDLES) 31G X 5 MM MISC Inject 1 Device into the skin in the morning, at noon, in the evening, and at bedtime. 12/01/21   Shamleffer, Konrad Dolores, MD  meloxicam (MOBIC) 15 MG tablet Take 1 tablet (15 mg total) by mouth daily. 01/05/23   Louann Sjogren, DPM  metFORMIN (GLUCOPHAGE-XR) 500 MG 24 hr tablet Take 2 tablets (1,000 mg total) by mouth daily with breakfast AND 1 tablet (500 mg total) daily with supper. Patient taking differently: Take 2 tablets (1,000 mg total) by mouth daily with breakfast AND 2 tablets (1000 mg total) daily with supper. 12/01/21   Shamleffer, Konrad Dolores, MD  metoprolol succinate (TOPROL-XL) 50 MG 24 hr tablet TAKE 1 TABLET BY MOUTH EVERY DAY WITH OR IMMEDIATELY FOLLOWING A MEAL 01/15/23   Agapito Games, MD  mometasone-formoterol [facility]) 200-5 MCG/ACT AERO Inhale 2 puffs into the lungs in the morning and at bedtime. 10/06/22   Agapito Games, MD  Multiple Vitamin (MULTIVITAMIN) tablet Take 1 tablet by mouth daily.    [provider]  Pitavastatin Calcium 2 MG TABS Take 1 tablet (2 mg total) by mouth at bedtime. 03/24/22   Agapito Games, MD  predniSONE (DELTASONE) 20 MG tablet Take one tab by mouth twice daily for 4 days, then one daily. Take with food. 11/05/22   Lattie Haw, MD  valsartan (DIOVAN) 320 MG tablet TAKE 1 TABLET BY MOUTH EVERY DAY 12/14/22   Agapito Games, MD    Family History Family History  Problem Relation Age of Onset   Other Mother        RA and CHF   Diabetes Father    Diabetes Other    Heart disease Other     Social History Social History   Tobacco Use   Smoking status: Every Day    Packs/day: .5    Types: Cigarettes    Smokeless tobacco: Never  Vaping Use   Vaping Use: Never used  Substance Use Topics   Alcohol use: No   Drug use: No     Allergies   Trulicity [dulaglutide], Crestor [rosuvastatin], Hydrochlorothiazide, Jentadueto [linagliptin-metformin hcl er], Lipitor [atorvastatin], Onglyza [saxagliptin], Ozempic (0.25 or 0.5 mg-dose) [semaglutide(0.25 or 0.5mg -dos)], Valsartan, and Victoza [liraglutide]   Review of Systems Review of Systems  Gastrointestinal:  Positive for abdominal pain and nausea.     Physical Exam Triage Vital Signs ED Triage Vitals  Enc Vitals Group     BP      Pulse      Resp      Temp      Temp src      SpO2      Weight      Height      Head Circumference  Peak Flow      Pain Score      Pain Loc      Pain Edu?      Excl. in GC?    No data found.  Updated Vital Signs BP 133/82   Pulse 95   Temp 97.8 F (36.6 C)   Resp 19   SpO2 93%     Physical Exam Vitals and nursing note reviewed.  Constitutional:      Appearance: Normal appearance. She is obese. She is ill-appearing.  HENT:     Head: Normocephalic and atraumatic.     Mouth/Throat:     Mouth: Mucous membranes are moist.     Pharynx: Oropharynx is clear.  Eyes:     Extraocular Movements: Extraocular movements intact.     Conjunctiva/sclera: Conjunctivae normal.     Pupils: Pupils are equal, round, and reactive to light.  Cardiovascular:     Rate and Rhythm: Normal rate and regular rhythm.     Pulses: Normal pulses.     Heart sounds: Normal heart sounds.  Pulmonary:     Effort: Pulmonary effort is normal.     Breath sounds: Normal breath sounds. No wheezing, rhonchi or rales.  Abdominal:     General: Bowel sounds are decreased. There is no distension or abdominal bruit.     Palpations: Abdomen is soft. There is no shifting dullness, fluid wave, hepatomegaly, splenomegaly, mass or pulsatile mass.     Tenderness: There is abdominal tenderness in the right upper quadrant and right  lower quadrant. There is no right CVA tenderness, left CVA tenderness, guarding or rebound. Negative signs include Murphy's sign, Rovsing's sign and McBurney's sign.     Hernia: No hernia is present.  Musculoskeletal:        General: Normal range of motion.     Cervical back: Normal range of motion and neck supple.  Skin:    General: Skin is warm and dry.  Neurological:     General: No focal deficit present.     Mental Status: She is alert and oriented to person, place, and time. Mental status is at baseline.  Psychiatric:        Mood and Affect: Mood normal.        Behavior: Behavior normal.      UC Treatments / Results  Labs (all labs ordered are listed, but only abnormal results are displayed) Labs Reviewed  POCT URINALYSIS DIP (MANUAL ENTRY)    EKG   Radiology No results found.  Procedures Procedures (including critical care time)  Medications Ordered in UC Medications  ondansetron (ZOFRAN-ODT) disintegrating tablet 8 mg (8 mg Oral Given 03/25/23 0837)    Initial Impression / Assessment and Plan / UC Course  I have reviewed the triage vital signs and the nursing notes.  Pertinent labs & imaging results that were available during my care of the patient were reviewed by me and considered in my medical decision making (see chart for details).     MDM: 1.  Abdominal pain, right upper quadrant-EKG revealed normal sinus rhythm similar in appearance to previous EKG of 06/19/2022.  UA revealed above, 2.  Nausea Zofran 8 mg given once in clinic. Instructed patient to go to [facility] ED now for further evaluation of right upper quadrant abdominal pain.  Patient agreed and verbalized understanding of these instructions and this plan of care today.  Patient discharged to ED, hemodynamically stable. Final Clinical Impressions(s) / UC Diagnoses  Final diagnoses:  Abdominal pain, right upper quadrant  Nausea     Discharge Instructions      Instructed  patient to go to [facility] ED now for further evaluation of right upper quadrant abdominal pain.     ED Prescriptions   None    PDMP not reviewed this encounter.   Trevor Iha, FNP 03/25/23 0926    Trevor Iha, FNP 03/25/23 1023

## 2023-03-25 NOTE — ED Triage Notes (Signed)
Patient arrives ambulatory by POV c/o left sided chest pain, upper left sided back pain, nausea, shortness of breath and black stools. Denies any blood thinners. Has tried tylenol, pepto and tums for symptoms.

## 2023-03-25 NOTE — ED Notes (Signed)
Pt given gingerale and saltines to attempt to quell her nausea.

## 2023-03-25 NOTE — ED Triage Notes (Addendum)
Pt presents to uc with co of nausea for one week and pain in right side/ abd pain.new onset of left cp, left back pain, left arm weakness, chills and cold sweats,  and black tarry stools since yesterday with sob. Pt reports she has taken tylenol this morning and tums. No history of bloody stools or cardiac events does reports she had colon poylps removed recently.

## 2023-03-25 NOTE — ED Notes (Signed)
Pt requesting pain medication. EDP notified. 

## 2023-03-25 NOTE — ED Notes (Signed)
Patient transported to CT 

## 2023-04-06 ENCOUNTER — Ambulatory Visit (INDEPENDENT_AMBULATORY_CARE_PROVIDER_SITE_OTHER): Payer: BC Managed Care – PPO

## 2023-04-06 ENCOUNTER — Encounter: Payer: Self-pay | Admitting: Family Medicine

## 2023-04-06 ENCOUNTER — Ambulatory Visit (INDEPENDENT_AMBULATORY_CARE_PROVIDER_SITE_OTHER): Payer: BC Managed Care – PPO | Admitting: Family Medicine

## 2023-04-06 VITALS — BP 149/68 | HR 77 | Ht 61.0 in | Wt 204.0 lb

## 2023-04-06 DIAGNOSIS — E1165 Type 2 diabetes mellitus with hyperglycemia: Secondary | ICD-10-CM

## 2023-04-06 DIAGNOSIS — J189 Pneumonia, unspecified organism: Secondary | ICD-10-CM | POA: Diagnosis not present

## 2023-04-06 DIAGNOSIS — Z794 Long term (current) use of insulin: Secondary | ICD-10-CM

## 2023-04-06 DIAGNOSIS — I10 Essential (primary) hypertension: Secondary | ICD-10-CM

## 2023-04-06 DIAGNOSIS — J9811 Atelectasis: Secondary | ICD-10-CM | POA: Diagnosis not present

## 2023-04-06 DIAGNOSIS — G4733 Obstructive sleep apnea (adult) (pediatric): Secondary | ICD-10-CM | POA: Diagnosis not present

## 2023-04-06 MED ORDER — AMOXICILLIN-POT CLAVULANATE 875-125 MG PO TABS: 1.0000 | ORAL_TABLET | Freq: Two times a day (BID) | ORAL | 0 refills | Status: DC

## 2023-04-06 MED ORDER — PREDNISONE 20 MG PO TABS: 40.0000 mg | ORAL_TABLET | Freq: Every day | ORAL | 0 refills | Status: DC

## 2023-04-06 MED ORDER — VALSARTAN-HYDROCHLOROTHIAZIDE 320-12.5 MG PO TABS
1.0000 | ORAL_TABLET | Freq: Every day | ORAL | 0 refills | Status: DC
Start: 2023-04-06 — End: 2023-07-02

## 2023-04-06 MED ORDER — ALBUTEROL SULFATE HFA 108 (90 BASE) MCG/ACT IN AERS
2.0000 | INHALATION_SPRAY | Freq: Four times a day (QID) | RESPIRATORY_TRACT | 1 refills | Status: AC | PRN
Start: 2023-04-06 — End: ?

## 2023-04-06 NOTE — Progress Notes (Signed)
Established Patient Office Visit  Subjective   Patient ID: CASIDEE BRANSON, female    DOB: 12-11-1965  Age: 57 y.o. MRN: [id number]  Chief Complaint  Patient presents with   Hypertension    Bp has been elevated for sometime now   Follow-up    Pt stated that she will need a CXR done to check for PNE, she said that she feels worse than she did. She has L leg swelling, vertigo affecting her R side, ear fullness    HPI  Hypertension- Pt denies chest pain, SOB, dizziness, or heart palpitations.  Taking meds as directed w/o problems.  Denies medication side effects.    Follow-up diabetes-she is currently followed by endocrinology.  Currently on long-acting and short acting insulin as well as metformin.  Was also recently in the hospital earlier in the month for multifocal pneumonia.  She says that she did complete her antibiotics and felt a lot better for a few days but then started to feel bad again with lots of ear pressure and pain, sore throat and continued cough and congestion.  Ports feeling more short of breath.  Occasionally feels like her heart is racing.  He also notes that she has been having some more swelling in her left ankle she has had a workup including a negative Doppler which was reassuring so VTE.    ROS    Objective:     BP (!) 149/68   Pulse 77   Ht 5\' 1"  (1.549 m)   Wt 204 lb (92.5 kg)   SpO2 94%   BMI 38.55 kg/m    Physical Exam Vitals and nursing note reviewed.  Constitutional:      Appearance: She is well-developed.  HENT:     Head: Normocephalic and atraumatic.     Right Ear: Tympanic membrane, ear canal and external ear normal.     Left Ear: Tympanic membrane, ear canal and external ear normal.     Nose: Nose normal.  Eyes:     Conjunctiva/sclera: Conjunctivae normal.     Pupils: Pupils are equal, round, and reactive to light.  Neck:     Thyroid: No thyromegaly.  Cardiovascular:     Rate and Rhythm: Normal rate and regular rhythm.     Heart  sounds: Normal heart sounds.  Pulmonary:     Effort: Pulmonary effort is normal.     Breath sounds: Normal breath sounds. No wheezing.  Musculoskeletal:     Cervical back: Neck supple.  Lymphadenopathy:     Cervical: No cervical adenopathy.  Skin:    General: Skin is warm and dry.  Neurological:     Mental Status: She is alert and oriented to person, place, and time.  Psychiatric:        Behavior: Behavior normal.      Results for orders placed or performed in visit on 04/06/23  Microalbumin / creatinine urine ratio  Result Value Ref Range   Microalb Creat Ratio 78.7   Hemoglobin A1c  Result Value Ref Range   Hemoglobin A1C 7.8       The 10-year ASCVD risk score (Arnett DK, et al., 2019) is: 19.8%    Assessment & Plan:   Problem List Items Addressed This Visit       Cardiovascular and Mediastinum   Essential hypertension    Add hct to diovan. F/U in 4 weeks.       Relevant Medications   valsartan-hydrochlorothiazide (DIOVAN-HCT) 320-12.5 MG tablet  Respiratory   OSA (obstructive sleep apnea)    Started wearing her CPAP again.         Endocrine   Type 2 diabetes mellitus with hyperglycemia, with long-term current use of insulin (HCC) - Primary   Relevant Medications   valsartan-hydrochlorothiazide (DIOVAN-HCT) 320-12.5 MG tablet   Other Visit Diagnoses     Multifocal pneumonia       Relevant Medications   albuterol (VENTOLIN HFA) 108 (90 Base) MCG/ACT inhaler   amoxicillin-clavulanate (AUGMENTIN) 875-125 MG tablet   Other Relevant Orders   DG Chest 2 View (Completed)       Multifocal pneumonia-continue to use the [facility] daily and I will send over prescription for albuterol to use in between for chest tightness wheezing or shortness of breath.  Will get chest x-ray today for further workup.  I suspect that the pneumonia may not have completely cleared as its only about 3 weeks postdiagnosis but I want a make sure that it is not worsening or there  is any sign of other complication or edema etc.  Return in about 4 weeks (around 05/04/2023) for Hypertension, recheck lungs.    Nani Gasser, MD

## 2023-04-06 NOTE — Assessment & Plan Note (Signed)
Started wearing her CPAP again.

## 2023-04-06 NOTE — Progress Notes (Signed)
Hi Terri Zimmerman, chest x-ray actually looks really reassuring which is fantastic.  So I sent over a prescription for Augmentin for you to take for your sinuses and ears.  Call if you are not feeling better by the end of the week.

## 2023-04-06 NOTE — Assessment & Plan Note (Signed)
Add hct to diovan. F/U in 4 weeks.

## 2023-04-08 ENCOUNTER — Telehealth: Payer: Self-pay | Admitting: Family Medicine

## 2023-04-08 NOTE — Telephone Encounter (Signed)
Patient called about high blood sugar levels due to taking Predinsone 20mg  she says her its been running into the 300 range please advise

## 2023-04-08 NOTE — Telephone Encounter (Signed)
Increase Tresiba by 10 units until come off the prednisone

## 2023-04-09 NOTE — Telephone Encounter (Signed)
Patient advised.

## 2023-05-03 ENCOUNTER — Other Ambulatory Visit: Payer: Self-pay | Admitting: Family Medicine

## 2023-05-03 DIAGNOSIS — R053 Chronic cough: Secondary | ICD-10-CM

## 2023-05-03 DIAGNOSIS — I10 Essential (primary) hypertension: Secondary | ICD-10-CM

## 2023-05-11 ENCOUNTER — Ambulatory Visit (INDEPENDENT_AMBULATORY_CARE_PROVIDER_SITE_OTHER): Payer: BC Managed Care – PPO

## 2023-05-11 ENCOUNTER — Ambulatory Visit (INDEPENDENT_AMBULATORY_CARE_PROVIDER_SITE_OTHER): Payer: BC Managed Care – PPO | Admitting: Family Medicine

## 2023-05-11 ENCOUNTER — Encounter: Payer: Self-pay | Admitting: Family Medicine

## 2023-05-11 VITALS — BP 127/65 | HR 94 | Ht 61.0 in | Wt 204.0 lb

## 2023-05-11 DIAGNOSIS — I10 Essential (primary) hypertension: Secondary | ICD-10-CM

## 2023-05-11 DIAGNOSIS — R1011 Right upper quadrant pain: Secondary | ICD-10-CM | POA: Diagnosis not present

## 2023-05-11 DIAGNOSIS — Z794 Long term (current) use of insulin: Secondary | ICD-10-CM | POA: Diagnosis not present

## 2023-05-11 DIAGNOSIS — E1165 Type 2 diabetes mellitus with hyperglycemia: Secondary | ICD-10-CM | POA: Diagnosis not present

## 2023-05-11 NOTE — Assessment & Plan Note (Signed)
Blood pressure looks phenomenal today.  She has been tracking it at home.  Several of the blood pressures have looked great she still had a couple in the 130s but overall much improved.  She still noticing a little bit of mild side effects with HCTZ so we discussed continuing it for at least another month to see if she continues to improve.  But if not we can always consider switching back to valsartan and chlorthalidone separately and discontinue the HCTZ completely.

## 2023-05-11 NOTE — Progress Notes (Addendum)
Established Patient Office Visit  Subjective   Patient ID: Terri Zimmerman, female    DOB: Mar 20, 1966  Age: 57 y.o. MRN: [id number]  Chief Complaint  Patient presents with   Follow-up         HPI  Hypertension- Pt denies chest pain, SOB, dizziness, or heart palpitations.  Taking meds as directed w/o problems.  Denies medication side effects. We added hct to her BP pill and she is doing well. Took 2 weeks to get used to it. Brought in home BP log.  They made her feel really tired and a little dizzy here and there.   She also reports that she has been having some intermittent right upper quadrant pain as well as some nausea and loss of appetite and increased GERD.  She says it started really around the time that she was seen in the emergency department for pneumonia so probably about 7 weeks.  She said even last night she felt nauseated enough she ended up going to bed early.  At first she thought maybe it was a side effect from the medication.    ROS    Objective:     BP 127/65   Pulse 94   Ht 5\' 1"  (1.549 m)   Wt 204 lb (92.5 kg)   SpO2 95%   BMI 38.55 kg/m    Physical Exam Vitals and nursing note reviewed.  Constitutional:      Appearance: She is well-developed.  HENT:     Head: Normocephalic and atraumatic.  Cardiovascular:     Rate and Rhythm: Normal rate and regular rhythm.     Heart sounds: Normal heart sounds.  Pulmonary:     Effort: Pulmonary effort is normal.     Breath sounds: Normal breath sounds.  Skin:    General: Skin is warm and dry.  Neurological:     Mental Status: She is alert and oriented to person, place, and time.  Psychiatric:        Behavior: Behavior normal.      No results found for any visits on 05/11/23.    The 10-year ASCVD risk score (Arnett DK, et al., 2019) is: 14.8%    Assessment & Plan:   Problem List Items Addressed This Visit       Cardiovascular and Mediastinum   Essential hypertension    Blood pressure looks  phenomenal today.  She has been tracking it at home.  Several of the blood pressures have looked great she still had a couple in the 130s but overall much improved.  She still noticing a little bit of mild side effects with HCTZ so we discussed continuing it for at least another month to see if she continues to improve.  But if not we can always consider switching back to valsartan and chlorthalidone separately and discontinue the HCTZ completely.        Endocrine   Type 2 diabetes mellitus with hyperglycemia, with long-term current use of insulin [facility])    She has follow-up with endocrinology coming up soon.  She has a CGM in place.      Other Visit Diagnoses     RUQ pain    -  Primary   Relevant Orders   US Abdomen Complete      Also follow-up recheck on lungs-lung exam is clear today and reassuring she is actually feeling much better just a mild intermittent cough.  But overall much improved.  Right upper quadrant pain-suspicious for gallbladder dysfunction/cholestasis.  Will schedule ultrasound for further evaluation.  Encouraged her to ask endocrine to order lipids when they draw labs next week.    No follow-ups on file.    Nani Gasser, MD

## 2023-05-11 NOTE — Assessment & Plan Note (Signed)
She has follow-up with endocrinology coming up soon.  She has a CGM in place.

## 2023-05-12 ENCOUNTER — Telehealth: Payer: Self-pay | Admitting: Family Medicine

## 2023-05-12 ENCOUNTER — Other Ambulatory Visit: Payer: Self-pay | Admitting: Family Medicine

## 2023-05-12 DIAGNOSIS — R1011 Right upper quadrant pain: Secondary | ICD-10-CM

## 2023-05-12 DIAGNOSIS — Z1231 Encounter for screening mammogram for malignant neoplasm of breast: Secondary | ICD-10-CM

## 2023-05-12 NOTE — Telephone Encounter (Signed)
05/12/2023-Spoke w/ [facility], Patient is scheduled for 05/19/2023 at 10:30am with Elenore Paddy, PA at [facility] at [facility] office.   Pt aware of appointment.

## 2023-05-12 NOTE — Telephone Encounter (Signed)
Referral placed and fwd to [facility] for scheduling

## 2023-05-12 NOTE — Progress Notes (Signed)
Hi Terri Zimmerman, ultrasound looks reassuring they did not see any gallstones or gallbladder wall thickening.  It does not 100% rule out dysfunction of the gallbladder but at least it rules out the stones.  If you are still having that right upper quadrant pain as well as the nausea I think the neck step would be to get you in with GI for further workup.  They did see the echotexture increase in the liver which is typical for fatty liver.  Which is already known.  The main treatment for fatty liver is to continue to work on healthy diet and regular exercise and decrease BMI.

## 2023-05-12 NOTE — Telephone Encounter (Signed)
Referral, clinical notes and copies of insurance cards have been faxed to [facility] at [phone]. Office will contact patient to schedule referral appointment.  

## 2023-05-12 NOTE — Telephone Encounter (Signed)
Pt called. She states she is still having the issue and wants to be referred to GI.

## 2023-05-19 DIAGNOSIS — R1011 Right upper quadrant pain: Secondary | ICD-10-CM | POA: Diagnosis not present

## 2023-05-19 DIAGNOSIS — E1165 Type 2 diabetes mellitus with hyperglycemia: Secondary | ICD-10-CM | POA: Diagnosis not present

## 2023-05-19 DIAGNOSIS — K219 Gastro-esophageal reflux disease without esophagitis: Secondary | ICD-10-CM | POA: Diagnosis not present

## 2023-05-19 DIAGNOSIS — R110 Nausea: Secondary | ICD-10-CM | POA: Diagnosis not present

## 2023-05-19 DIAGNOSIS — Z794 Long term (current) use of insulin: Secondary | ICD-10-CM | POA: Diagnosis not present

## 2023-05-21 DIAGNOSIS — Z794 Long term (current) use of insulin: Secondary | ICD-10-CM | POA: Diagnosis not present

## 2023-05-21 DIAGNOSIS — E1165 Type 2 diabetes mellitus with hyperglycemia: Secondary | ICD-10-CM | POA: Diagnosis not present

## 2023-05-21 LAB — PROTEIN / CREATININE RATIO, URINE
Albumin, U: 7.7
Creatinine, Urine: 101.5

## 2023-05-21 LAB — MICROALBUMIN / CREATININE URINE RATIO: Microalb Creat Ratio: 7.5

## 2023-06-11 DIAGNOSIS — R1011 Right upper quadrant pain: Secondary | ICD-10-CM | POA: Diagnosis not present

## 2023-06-11 DIAGNOSIS — R110 Nausea: Secondary | ICD-10-CM | POA: Diagnosis not present

## 2023-06-30 ENCOUNTER — Other Ambulatory Visit: Payer: Self-pay | Admitting: Family Medicine

## 2023-06-30 DIAGNOSIS — I10 Essential (primary) hypertension: Secondary | ICD-10-CM

## 2023-06-30 DIAGNOSIS — R053 Chronic cough: Secondary | ICD-10-CM

## 2023-08-07 ENCOUNTER — Other Ambulatory Visit: Payer: Self-pay | Admitting: Family Medicine

## 2023-08-07 DIAGNOSIS — I10 Essential (primary) hypertension: Secondary | ICD-10-CM

## 2023-08-07 DIAGNOSIS — R053 Chronic cough: Secondary | ICD-10-CM

## 2023-08-09 ENCOUNTER — Other Ambulatory Visit: Payer: Self-pay | Admitting: Family Medicine

## 2023-08-10 ENCOUNTER — Telehealth: Payer: Self-pay | Admitting: Family Medicine

## 2023-08-10 NOTE — Telephone Encounter (Signed)
Patient called requesting a refill of ; metoprolol succinate (TOPROL-XL) 50 MG 24 hr tablet [[id number]]   The pharmacy stated that Dr. Linford Arnold has discontinued this medication for the pt. Patient is wanting clarification on this.  Pharmacy ;   [id number] [address], [place] - [address]

## 2023-08-11 NOTE — Telephone Encounter (Signed)
Medication has been sent.  

## 2023-08-19 DIAGNOSIS — E1165 Type 2 diabetes mellitus with hyperglycemia: Secondary | ICD-10-CM | POA: Diagnosis not present

## 2023-08-19 DIAGNOSIS — L570 Actinic keratosis: Secondary | ICD-10-CM | POA: Diagnosis not present

## 2023-08-19 DIAGNOSIS — L300 Nummular dermatitis: Secondary | ICD-10-CM | POA: Diagnosis not present

## 2023-08-19 DIAGNOSIS — C44311 Basal cell carcinoma of skin of nose: Secondary | ICD-10-CM | POA: Diagnosis not present

## 2023-08-19 DIAGNOSIS — L820 Inflamed seborrheic keratosis: Secondary | ICD-10-CM | POA: Diagnosis not present

## 2023-08-19 DIAGNOSIS — Z794 Long term (current) use of insulin: Secondary | ICD-10-CM | POA: Diagnosis not present

## 2023-08-19 LAB — HEMOGLOBIN A1C: Hemoglobin A1C: 8.9

## 2023-09-13 ENCOUNTER — Emergency Department (HOSPITAL_BASED_OUTPATIENT_CLINIC_OR_DEPARTMENT_OTHER): Payer: BC Managed Care – PPO

## 2023-09-13 ENCOUNTER — Encounter (HOSPITAL_BASED_OUTPATIENT_CLINIC_OR_DEPARTMENT_OTHER): Payer: Self-pay

## 2023-09-13 ENCOUNTER — Emergency Department (HOSPITAL_BASED_OUTPATIENT_CLINIC_OR_DEPARTMENT_OTHER)
Admission: EM | Admit: 2023-09-13 | Discharge: 2023-09-13 | Disposition: A | Discharge status: Home / Self Care | Payer: BC Managed Care – PPO | Attending: Emergency Medicine | Admitting: Emergency Medicine

## 2023-09-13 ENCOUNTER — Other Ambulatory Visit: Payer: Self-pay

## 2023-09-13 DIAGNOSIS — Z79899 Other long term (current) drug therapy: Secondary | ICD-10-CM | POA: Diagnosis not present

## 2023-09-13 DIAGNOSIS — R911 Solitary pulmonary nodule: Secondary | ICD-10-CM | POA: Diagnosis not present

## 2023-09-13 DIAGNOSIS — J40 Bronchitis, not specified as acute or chronic: Secondary | ICD-10-CM | POA: Insufficient documentation

## 2023-09-13 DIAGNOSIS — E119 Type 2 diabetes mellitus without complications: Secondary | ICD-10-CM | POA: Insufficient documentation

## 2023-09-13 DIAGNOSIS — I10 Essential (primary) hypertension: Secondary | ICD-10-CM | POA: Diagnosis not present

## 2023-09-13 DIAGNOSIS — Z794 Long term (current) use of insulin: Secondary | ICD-10-CM | POA: Insufficient documentation

## 2023-09-13 DIAGNOSIS — R918 Other nonspecific abnormal finding of lung field: Secondary | ICD-10-CM | POA: Diagnosis not present

## 2023-09-13 DIAGNOSIS — Z7984 Long term (current) use of oral hypoglycemic drugs: Secondary | ICD-10-CM | POA: Insufficient documentation

## 2023-09-13 DIAGNOSIS — R059 Cough, unspecified: Secondary | ICD-10-CM | POA: Diagnosis not present

## 2023-09-13 DIAGNOSIS — R0789 Other chest pain: Secondary | ICD-10-CM | POA: Diagnosis not present

## 2023-09-13 DIAGNOSIS — R079 Chest pain, unspecified: Secondary | ICD-10-CM

## 2023-09-13 DIAGNOSIS — R0602 Shortness of breath: Secondary | ICD-10-CM | POA: Diagnosis not present

## 2023-09-13 DIAGNOSIS — J029 Acute pharyngitis, unspecified: Secondary | ICD-10-CM | POA: Diagnosis not present

## 2023-09-13 LAB — CBC
HCT: 40.3 % (ref 36.0–46.0)
Hemoglobin: 12.7 g/dL (ref 12.0–15.0)
MCH: 26.5 pg (ref 26.0–34.0)
MCHC: 31.5 g/dL (ref 30.0–36.0)
MCV: 84.1 fL (ref 80.0–100.0)
Platelets: 270 10*3/uL (ref 150–400)
RBC: 4.79 MIL/uL (ref 3.87–5.11)
RDW: 14.4 % (ref 11.5–15.5)
WBC: 11.7 10*3/uL — ABNORMAL HIGH (ref 4.0–10.5)
nRBC: 0 % (ref 0.0–0.2)

## 2023-09-13 LAB — BASIC METABOLIC PANEL
Anion gap: 11 (ref 5–15)
BUN: 9 mg/dL (ref 6–20)
CO2: 26 mmol/L (ref 22–32)
Calcium: 9.1 mg/dL (ref 8.9–10.3)
Chloride: 100 mmol/L (ref 98–111)
Creatinine, Ser: 0.72 mg/dL (ref 0.44–1.00)
GFR, Estimated: >60 mL/min (ref >60)
Glucose, Bld: 295 mg/dL — ABNORMAL HIGH (ref 70–99)
Potassium: 3.7 mmol/L (ref 3.5–5.1)
Sodium: 137 mmol/L (ref 135–145)

## 2023-09-13 LAB — TROPONIN I (HIGH SENSITIVITY)
Troponin I (High Sensitivity): 3 ng/L (ref <18)
Troponin I (High Sensitivity): 3 ng/L (ref <18)

## 2023-09-13 LAB — D-DIMER, QUANTITATIVE: D-Dimer, Quant: 0.52 ug{FEU}/mL — ABNORMAL HIGH (ref 0.00–0.50)

## 2023-09-13 MED ORDER — ALBUTEROL SULFATE HFA 108 (90 BASE) MCG/ACT IN AERS
1.0000 | INHALATION_SPRAY | Freq: Four times a day (QID) | RESPIRATORY_TRACT | Status: DC | PRN
  Administered 2023-09-13: 2 via RESPIRATORY_TRACT
  Filled 2023-09-13: qty 6.7

## 2023-09-13 MED ORDER — BENZONATATE 100 MG PO CAPS: 100.0000 mg | ORAL_CAPSULE | Freq: Three times a day (TID) | ORAL | 0 refills | Status: DC

## 2023-09-13 MED ORDER — BENZONATATE 100 MG PO CAPS
100.0000 mg | ORAL_CAPSULE | Freq: Once | ORAL | Status: AC
  Administered 2023-09-13: 100 mg via ORAL
  Filled 2023-09-13: qty 1

## 2023-09-13 NOTE — ED Triage Notes (Signed)
Pt reports shortness of breath, non-productive cough, chest pain and a sore throat; onset Friday. She states this feels like pneumonia. Denies fever/chills.

## 2023-09-13 NOTE — ED Provider Notes (Signed)
[place] [facility] Provider Note   CSN: [id number] Arrival date & time: 09/13/23  1631     History  Chief Complaint  Patient presents with   Shortness of Breath    RINIYAH BRENNEKE is a 57 y.o. female,k history of diabetes, hypertension, asthma, who presents to the ED secondary to shortness of breath, and chest tightness that is been going on for the last 4 days.  She states it started last Friday, and has progressively gotten worse.  She states that it feels just tight, like she is having a hard time taking a deep breath.  She does not hurt when she takes a deep breath, but does feel short of breath, have sore throat, productive cough, and just feels unwell.  Has not taken a COVID test, but does not believe it is COVID.  She describes the chest pain, as all over her chest, and tight in nature.  Denies any alleviating factors, or aggravating factors.     Home Medications Prior to Admission medications   Medication Sig Start Date End Date Taking? Authorizing Provider  benzonatate (TESSALON) 100 MG capsule Take 1 capsule (100 mg total) by mouth every 8 (eight) hours. 09/13/23  Yes Hajra Port L, PA  acetaminophen (TYLENOL) 325 MG tablet Take 650 mg by mouth 2 (two) times daily.    [provider]  albuterol (VENTOLIN HFA) 108 (90 Base) MCG/ACT inhaler Inhale 2-4 puffs into the lungs every 6 (six) hours as needed for wheezing or shortness of breath. 04/06/23   Agapito Games, MD  AMBULATORY NON FORMULARY MEDICATION Medication Name: CPAP supplies hose, humidifier  Dx OSA with AHI 99. [employer]: [phone] 02/02/23   Agapito Games, MD  Continuous Blood Gluc Receiver (DEXCOM G7 RECEIVER) DEVI by Does not apply route. 08/05/22   [provider]  Continuous Glucose Sensor (DEXCOM G7 SENSOR) MISC by Does not apply route. 01/18/23   [provider]  fluticasone (FLONASE) 50 MCG/ACT nasal spray Place 2 sprays into both  nostrils daily. 07/07/21 10/07/23  Clayborne Dana, NP  insulin degludec (TRESIBA FLEXTOUCH) 200 UNIT/ML FlexTouch Pen Inject 82 Units into the skin in the morning and at bedtime. Patient taking differently: Inject 160 Units into the skin in the morning. 12/01/21   Shamleffer, Konrad Dolores, MD  insulin lispro (HUMALOG KWIKPEN) 100 UNIT/ML KwikPen Inject 5 Units into the skin daily with breakfast AND 10 Units daily with lunch AND 12 Units daily with supper. Patient taking differently: Inject 5 Units into the skin daily with breakfast AND 10 Units daily with lunch AND 12 Units daily with supper. Continue Humalog 20 units before meals plus your sliding scale 12/01/21   Shamleffer, Konrad Dolores, MD  Insulin Pen Needle (B-D UF III MINI PEN NEEDLES) 31G X 5 MM MISC Inject 1 Device into the skin in the morning, at noon, in the evening, and at bedtime. 12/01/21   Shamleffer, Konrad Dolores, MD  metFORMIN (GLUCOPHAGE-XR) 500 MG 24 hr tablet Take 2 tablets (1,000 mg total) by mouth daily with breakfast AND 1 tablet (500 mg total) daily with supper. Patient taking differently: Take 2 tablets (1,000 mg total) by mouth daily with breakfast AND 2 tablets (1000 mg total) daily with supper. 12/01/21   Shamleffer, Konrad Dolores, MD  metoprolol succinate (TOPROL-XL) 50 MG 24 hr tablet TAKE 1 TABLET BY MOUTH EVERY DAY WITH OR IMMEDIATELY FOLLOWING A MEAL 08/10/23   Agapito Games, MD  mometasone-formoterol [facility]) 200-5 MCG/ACT  AERO Inhale 2 puffs into the lungs in the morning and at bedtime. 10/06/22   Agapito Games, MD  Multiple Vitamin (MULTIVITAMIN) tablet Take 1 tablet by mouth daily.    [provider]  Pitavastatin Calcium 2 MG TABS TAKE 1 TABLET BY MOUTH AT BEDTIME. 05/03/23   Agapito Games, MD  valsartan-hydrochlorothiazide (DIOVAN-HCT) 320-12.5 MG tablet TAKE 1 TABLET BY MOUTH EVERY DAY 08/09/23   Agapito Games, MD      Allergies    Trulicity [dulaglutide], Crestor  [rosuvastatin], Jentadueto [linagliptin-metformin hcl er], Lipitor [atorvastatin], Onglyza [saxagliptin], Ozempic (0.25 or 0.5 mg-dose) [semaglutide(0.25 or 0.5mg -dos)], and Victoza [liraglutide]    Review of Systems   Review of Systems  Respiratory:  Positive for shortness of breath.     Physical Exam Updated Vital Signs BP (!) 142/67   Pulse 96   Temp 98.8 F (37.1 C) (Oral)   Resp (!) 27   Ht 5\' 1"  (1.549 m)   Wt 90.7 kg   SpO2 95%   BMI 37.79 kg/m  Physical Exam Vitals and nursing note reviewed.  Constitutional:      General: She is not in acute distress.    Appearance: She is well-developed.  HENT:     Head: Normocephalic and atraumatic.  Eyes:     Conjunctiva/sclera: Conjunctivae normal.  Cardiovascular:     Rate and Rhythm: Normal rate and regular rhythm.     Heart sounds: No murmur heard. Pulmonary:     Effort: Pulmonary effort is normal. No respiratory distress.     Breath sounds: Examination of the right-lower field reveals decreased breath sounds. Examination of the left-lower field reveals decreased breath sounds. Decreased breath sounds present.  Abdominal:     Palpations: Abdomen is soft.     Tenderness: There is no abdominal tenderness.  Musculoskeletal:        General: No swelling.     Cervical back: Neck supple.  Skin:    General: Skin is warm and dry.     Capillary Refill: Capillary refill takes less than 2 seconds.  Neurological:     Mental Status: She is alert.  Psychiatric:        Mood and Affect: Mood normal.     ED Results / Procedures / Treatments   Labs (all labs ordered are listed, but only abnormal results are displayed) Labs Reviewed  BASIC METABOLIC PANEL - Abnormal; Notable for the following components:      Result Value   Glucose, Bld 295 (*)    All other components within normal limits  CBC - Abnormal; Notable for the following components:   WBC 11.7 (*)    All other components within normal limits  D-DIMER, QUANTITATIVE -  Abnormal; Notable for the following components:   D-Dimer, Quant 0.52 (*)    All other components within normal limits  TROPONIN I (HIGH SENSITIVITY)  TROPONIN I (HIGH SENSITIVITY)    EKG EKG Interpretation Date/Time:  Monday September 13 2023 16:46:34 EDT Ventricular Rate:  95 PR Interval:  148 QRS Duration:  86 QT Interval:  356 QTC Calculation: 447 R Axis:   246  Text Interpretation:   Normal sinus rhythm Right superior axis deviation  Confirmed by Vivi Barrack [phone]) on 09/13/2023 4:57:08 PM  Radiology DG Chest 2 View  Result Date: 09/13/2023 CLINICAL DATA:  Chest pain, shortness of breath and nonproductive cough with sore throat onset 3 days ago. EXAM: CHEST - 2 VIEW COMPARISON:  PA Lat chest 04/06/2023, CTA chest 03/25/2023.  FINDINGS: There is mild pulmonary overinflation, central bronchial thickening consistent with bronchitis. No consolidation or effusion is seen. There are scattered linear scar-like opacities in the left base. 7 mm irregular nodule newly noted in the left lower lung field in an area of previous band atelectasis, possibly nodular atelectasis but indeterminate. Remainder of the lungs are clear. The cardiomediastinal silhouette and vasculature are normal. Mild osteopenia, spondylosis and thoracic kyphosis. IMPRESSION: 1. 7 mm irregular nodule newly noted in the left lower lung field in an area of previous band atelectasis, possibly nodular atelectasis but indeterminate. Follow-up chest radiograph in 3-4 weeks recommended. 2. Mild pulmonary overinflation and central bronchial thickening consistent with bronchitis. 3. Osteopenia, spondylosis and thoracic kyphosis. Electronically Signed   By: Almira Bar M.D.   On: 09/13/2023 20:32    Procedures Procedures    Medications Ordered in ED Medications  albuterol (VENTOLIN HFA) 108 (90 Base) MCG/ACT inhaler 1-2 puff (2 puffs Inhalation Given 09/13/23 1902)  benzonatate (TESSALON) capsule 100 mg (100 mg Oral Given  09/13/23 1820)    ED Course/ Medical Decision Making/ A&P             HEART Score: 3                    Medical Decision Making Patient is a 57 year old female, here for chest tightness, shortness of breath, sore throat, that is been going on for the last 4 days.  She has diminished breath sounds, the bilateral lower lungs, we will obtain chest x-ray, as well as a D-dimer given her tachycardia, chest pain, shortness of breath, and she is PERC positive.  Troponins, given chest discomfort.  EKG is overall reassuring.  Suspicious for about possible bronchitis versus pneumonia, will start with albuterol inhaler, hold off on steroids given her diabetes.  Amount and/or Complexity of Data Reviewed Labs: ordered.    Details: Troponin flat, D-dimer age stratified, within normal limits Radiology: ordered.    Details: Chest x-ray was 7 mm lung nodule, as well as a bronchial thickening consistent with bronchitis Discussion of management or test interpretation with external provider(s): Age-adjusted D-dimer, within normal limits, VTE unlikely.  Chest x-ray is clear other than some millimeter lung nodule, bronchial thickening, consistent with bronchitis.  She feels a little bit better after the albuterol, and Tessalon Perle.  Will discharge her with Tessalon Perles, albuterol, for symptomatic relief.  We will hold off on any kind of antibiotics given that she has no fevers, at this time, and no crackles on my exam, is fairly well-appearing.  Likely viral in nature.  We discussed emergent return precautions, and symptoms of possible pneumonia however.  She has follow-up with her PCP.  She was told about her 7 mm lung nodule and need for follow-up with PCP and she voiced understanding  Risk Prescription drug management.  Final Clinical Impression(s) / ED Diagnoses Final diagnoses:  Bronchitis  Pulmonary nodule  Chest pain, unspecified type    Rx / DC Orders ED Discharge Orders          Ordered     benzonatate (TESSALON) 100 MG capsule  Every 8 hours        09/13/23 2040              Lacinda Curvin Elbert Ewings, [place] 09/13/23 2046    Loetta Rough, MD 09/15/23 [phone]

## 2023-09-13 NOTE — Discharge Instructions (Addendum)
Your blood work was reassuring, I believe that your chest heaviness is likely secondary to your bronchitis.  I have prescribed you an albuterol inhaler, and Tessalon Perles to, help with your symptoms.  You could also take Mucinex to help with your symptoms as well.  Return to the ER if you feel like your chest pain is getting worse, or you are having worsening shortness of breath.  There is no evidence of pneumonia on your chest x-ray, but if you feel like symptoms are worsening, I recommend that you get further evaluated.  You have an abnormal lung nodule, on your chest x-ray, you will need to get your chest x-ray repeated in 1 month

## 2023-09-25 ENCOUNTER — Other Ambulatory Visit: Payer: Self-pay | Admitting: Family Medicine

## 2023-09-25 DIAGNOSIS — R059 Cough, unspecified: Secondary | ICD-10-CM

## 2023-09-27 IMAGING — DX DG CHEST 2V
2 series · 2 of 2 positions shown · non-contrast
Comparison: 04/23/2021, CT 05/24/2021

CLINICAL DATA: Cough and congestion. Brown sputum. Shortness of
breath. Smoker. COPD.

EXAM:
CHEST - 2 VIEW

[chest pa]
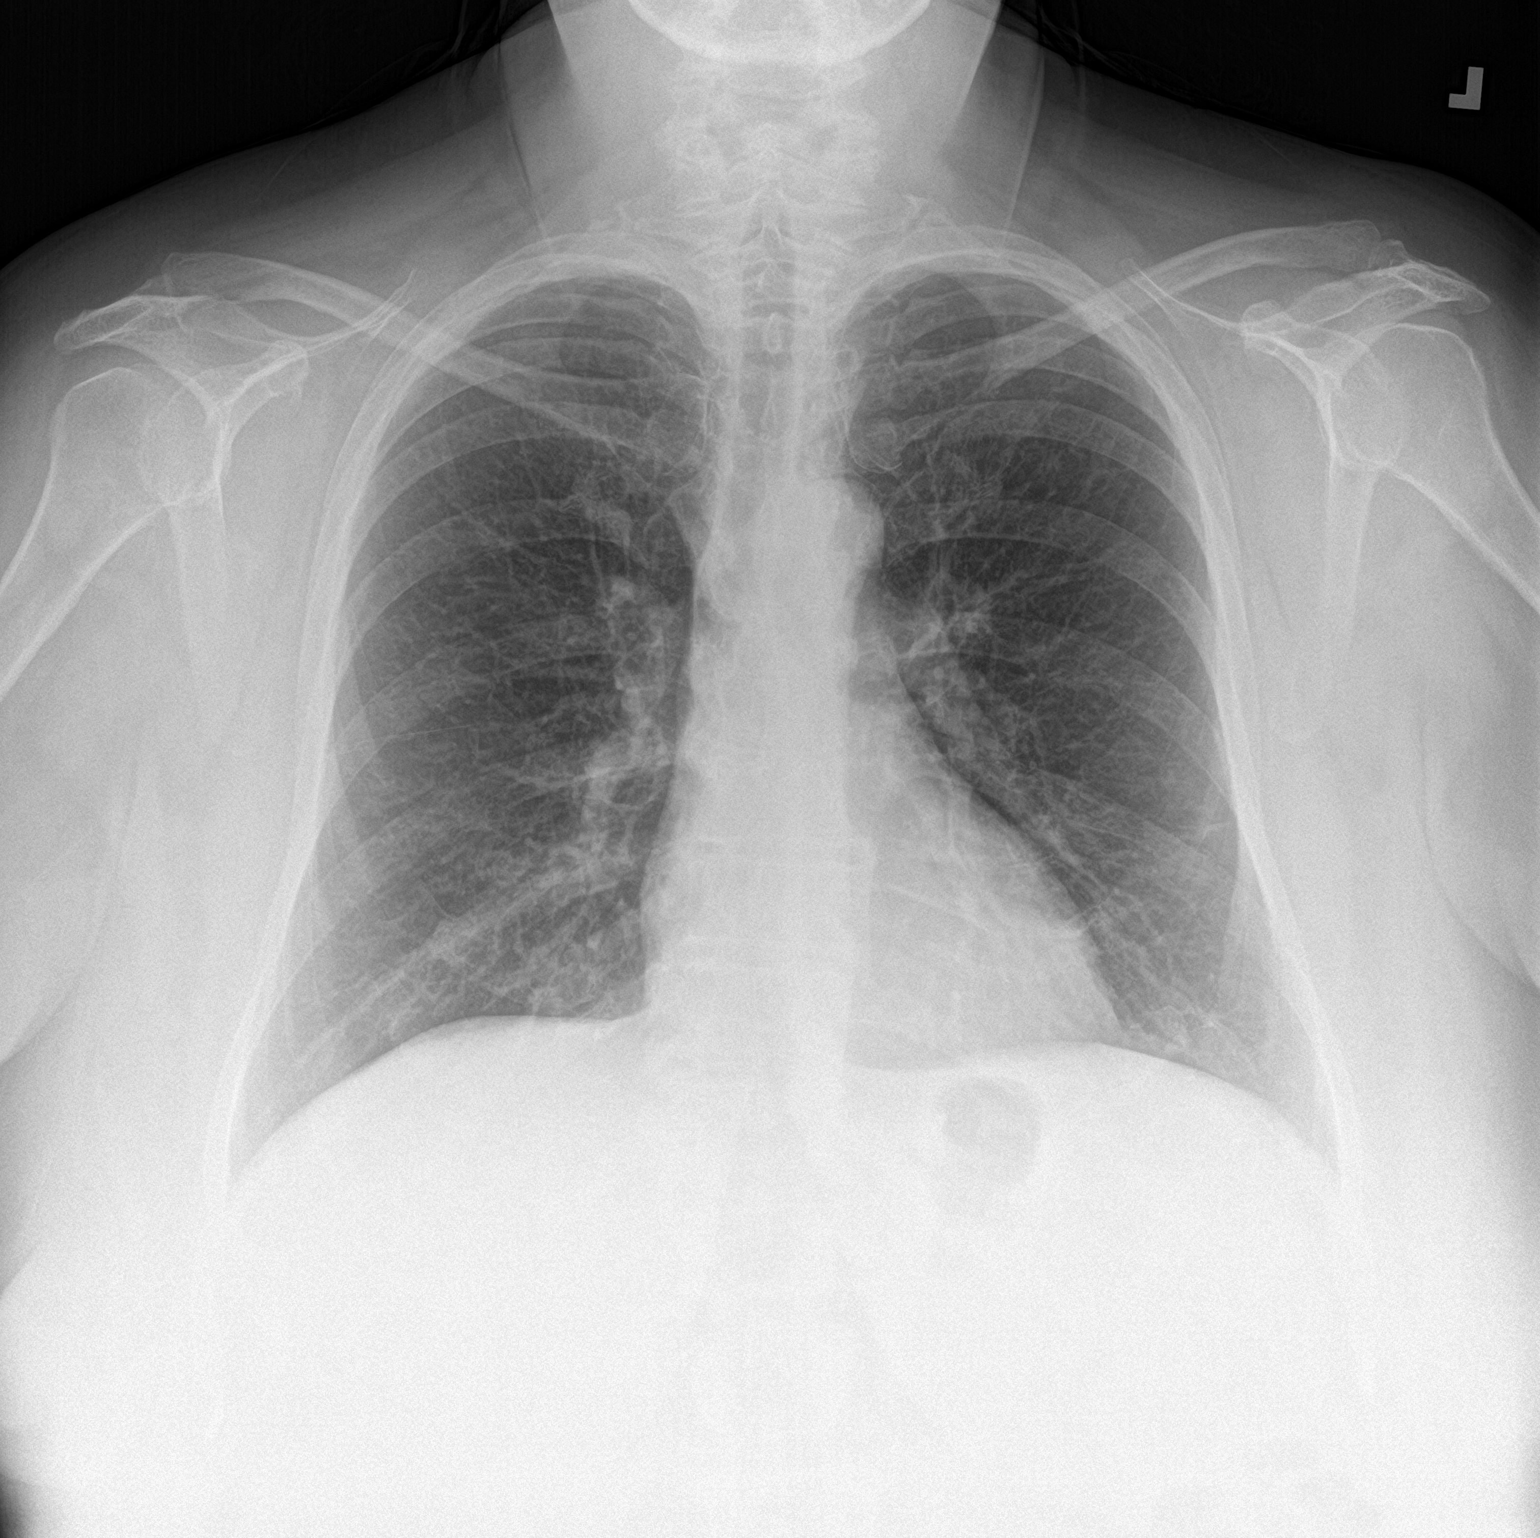

[chest lat]
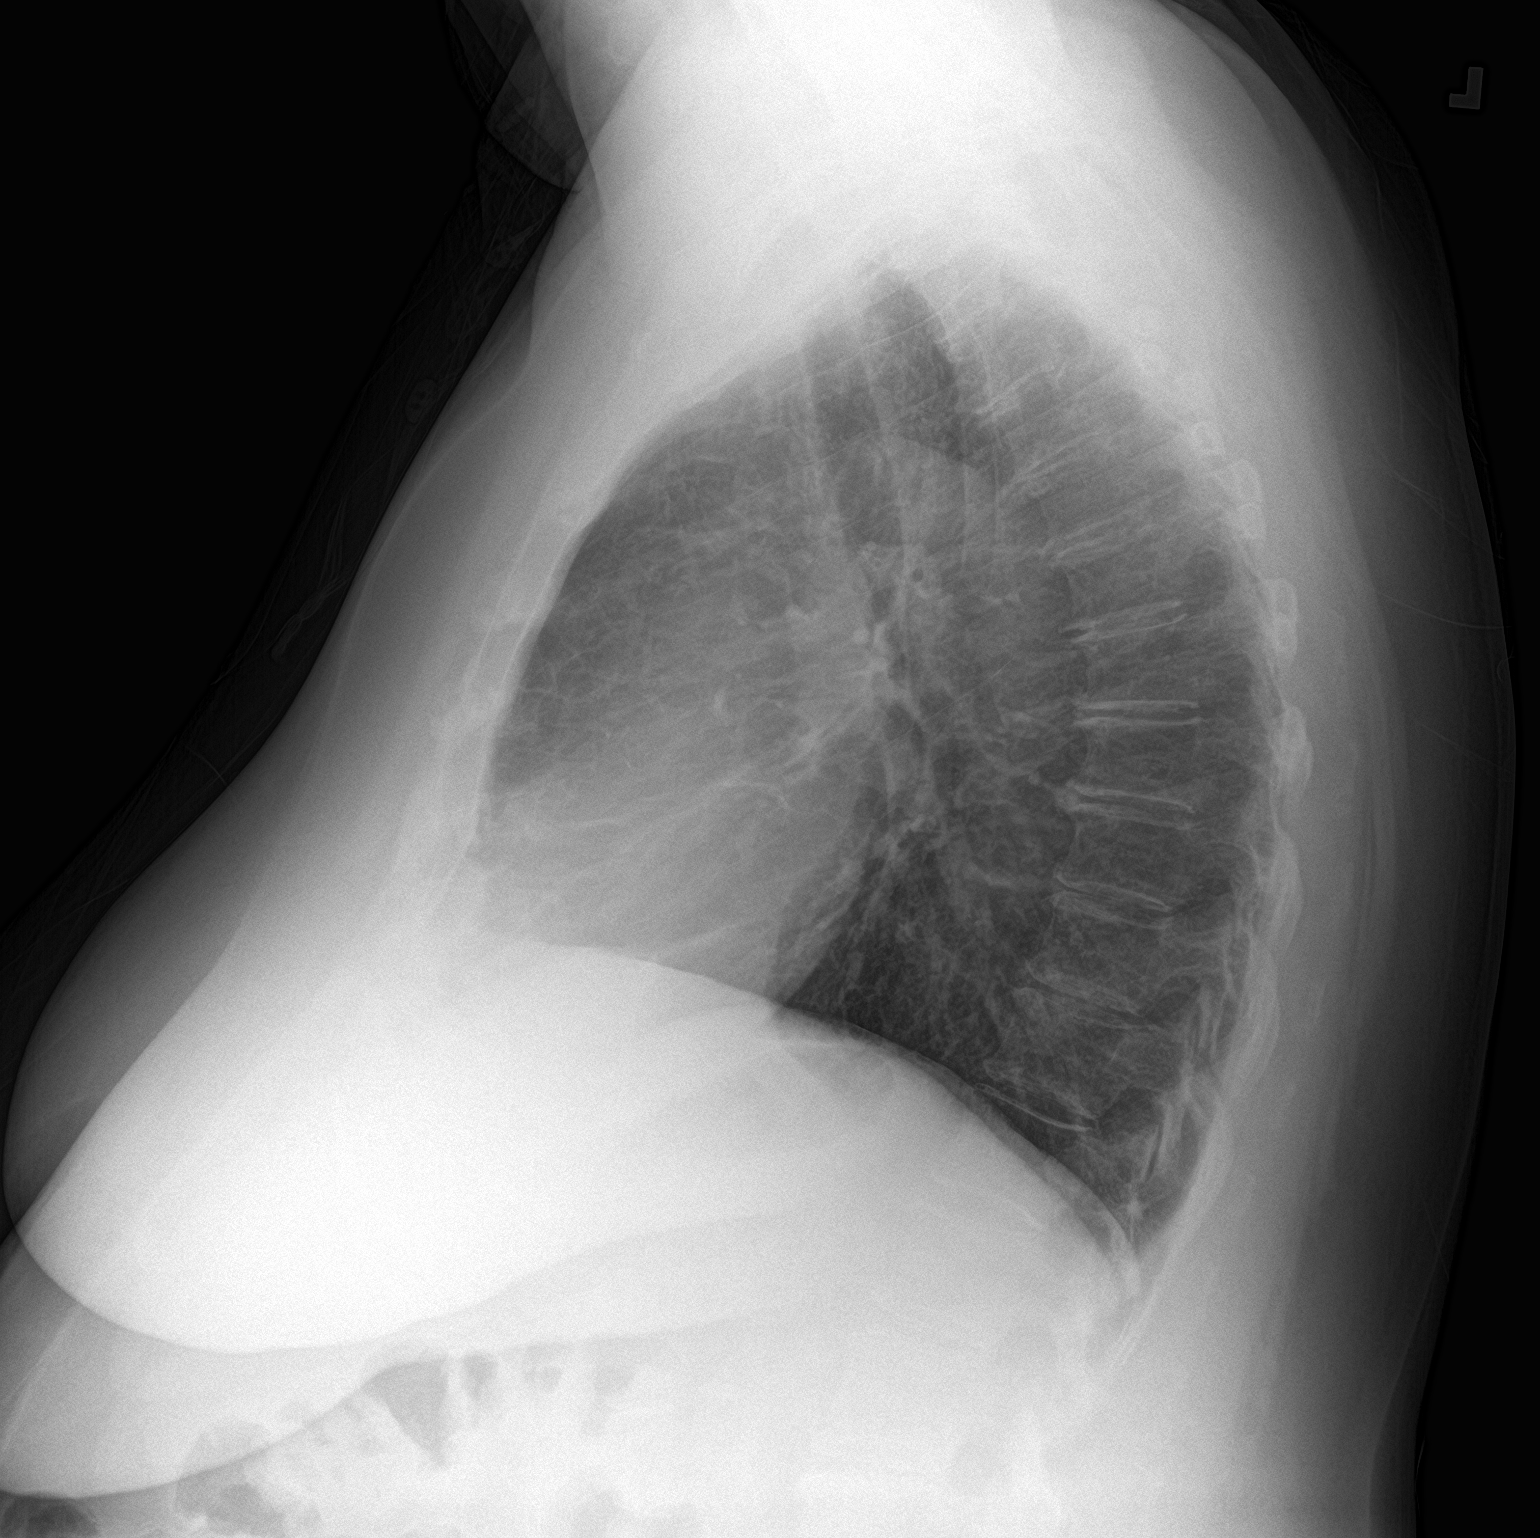

[2 of 2 positions shown; findings below may reference images not displayed]

FINDINGS: The cardiomediastinal contours are normal. Chronic bronchial
thickening with mild hyperinflation. Subsegmental scarring in the
lingula. Pulmonary vasculature is normal. No consolidation, pleural
effusion, or pneumothorax. No acute osseous abnormalities are seen.
IMPRESSION: Chronic bronchial thickening with mild hyperinflation. No acute
chest findings.

## 2023-10-13 ENCOUNTER — Ambulatory Visit: Payer: BC Managed Care – PPO

## 2023-10-13 ENCOUNTER — Ambulatory Visit: Payer: BC Managed Care – PPO | Admitting: Family Medicine

## 2023-10-13 ENCOUNTER — Encounter: Payer: Self-pay | Admitting: Family Medicine

## 2023-10-13 VITALS — BP 120/60 | HR 77 | Resp 12 | Ht 61.0 in | Wt 206.1 lb

## 2023-10-13 DIAGNOSIS — I700 Atherosclerosis of aorta: Secondary | ICD-10-CM

## 2023-10-13 DIAGNOSIS — R911 Solitary pulmonary nodule: Secondary | ICD-10-CM | POA: Diagnosis not present

## 2023-10-13 DIAGNOSIS — I10 Essential (primary) hypertension: Secondary | ICD-10-CM

## 2023-10-13 DIAGNOSIS — Z1231 Encounter for screening mammogram for malignant neoplasm of breast: Secondary | ICD-10-CM | POA: Diagnosis not present

## 2023-10-13 DIAGNOSIS — Z794 Long term (current) use of insulin: Secondary | ICD-10-CM

## 2023-10-13 DIAGNOSIS — R252 Cramp and spasm: Secondary | ICD-10-CM

## 2023-10-13 DIAGNOSIS — E1165 Type 2 diabetes mellitus with hyperglycemia: Secondary | ICD-10-CM | POA: Diagnosis not present

## 2023-10-13 DIAGNOSIS — E538 Deficiency of other specified B group vitamins: Secondary | ICD-10-CM | POA: Diagnosis not present

## 2023-10-13 NOTE — Assessment & Plan Note (Signed)
Last A1c was 8.9 with endocrinology they did recently make some adjustments to her regimen.  Gust that when her A1c is elevated it is going to make her more prone and susceptible to yeast infections.  Lab Results  Component Value Date   HGBA1C 8.9 08/19/2023

## 2023-10-13 NOTE — Assessment & Plan Note (Addendum)
Currently on pitavastatin.

## 2023-10-13 NOTE — Assessment & Plan Note (Signed)
Well controlled. Continue current regimen. Follow up in  6 mo  

## 2023-10-13 NOTE — Progress Notes (Signed)
Established Patient Office Visit  Subjective   Patient ID: Terri Zimmerman, female    DOB: 20-Jan-1966  Age: 57 y.o. MRN: [id number]  Chief Complaint  Patient presents with   lung    Follow up for spot on lung.    HPI  Having muscle cramps in her upper and lower body. Wonders if related to the valsartan.  He says sometimes the cramps are in her abdomen.  Sometimes even just movement and stretching will trigger the cramping.  Diabetes - follows with endocrine and they recently made changes to her regimen. Uncontrolled.   Lab Results  Component Value Date   HGBA1C 8.9 08/19/2023   Has been getting yeast infections frequently.  No sxs right now.   She is concerned bc her white blood count has been high on a couple of different labs.Marland Kitchen  Her white blood cell count was 11 in October when she was sick and back in May it was 16.  She also wanted to follow-up on her recent chest x-ray.  She was seen in the emergency department and had a chest x-ray which showed bronchitis on 09/13/2023.  They did note a 7 mm irregular nodule in the left lower lung fields and a previous span of atelectasis so they did recommend a repeat chest x-ray in 3 to 4 weeks.  Her all she is actually feeling a lot better.  Did complete her treatment.    ROS    Objective:     BP 120/60   Pulse 77   Resp 12   Ht 5\' 1"  (1.549 m)   Wt 206 lb 1.9 oz (93.5 kg)   SpO2 96%   BMI 38.95 kg/m    Physical Exam Vitals and nursing note reviewed.  Constitutional:      Appearance: Normal appearance.  HENT:     Head: Normocephalic and atraumatic.  Eyes:     Conjunctiva/sclera: Conjunctivae normal.  Cardiovascular:     Rate and Rhythm: Normal rate and regular rhythm.  Pulmonary:     Effort: Pulmonary effort is normal.     Breath sounds: Normal breath sounds.  Skin:    General: Skin is warm and dry.  Neurological:     Mental Status: She is alert.  Psychiatric:        Mood and Affect: Mood normal.       Results for orders placed or performed in visit on 10/13/23  Hemoglobin A1c  Result Value Ref Range   Hemoglobin A1C 8.9       The ASCVD Risk score (Arnett DK, et al., 2019) failed to calculate for the following reasons:   The valid total cholesterol range is 130 to 320 mg/dL    Assessment & Plan:   Problem List Items Addressed This Visit       Cardiovascular and Mediastinum   Essential hypertension    Well controlled. Continue current regimen. Follow up in  75mo       Aortic atherosclerosis (HCC)    Currently on pitavastatin.      Relevant Orders   CMP14+EGFR   CK (Creatine Kinase)   Magnesium   TSH   B12     Respiratory   Nodule of lower lobe of left lung   Relevant Orders   DG Chest 2 View     Endocrine   Type 2 diabetes mellitus with hyperglycemia, with long-term current use of insulin (HCC)    Last A1c was 8.9 with endocrinology they did recently make  some adjustments to her regimen.  Gust that when her A1c is elevated it is going to make her more prone and susceptible to yeast infections.  Lab Results  Component Value Date   HGBA1C 8.9 08/19/2023         Relevant Orders   CBC with Differential/Platelet   Fe+TIBC+Fer     Other   Muscle cramp - Primary   Relevant Orders   CMP14+EGFR   CK (Creatine Kinase)   Magnesium   TSH   B12   CBC with Differential/Platelet   Fe+TIBC+Fer   B12 deficiency   Relevant Orders   CMP14+EGFR   CK (Creatine Kinase)   Magnesium   TSH   B12    Vitamin 12 deficiency-due to recheck levels.  Also cramping-it seems quite diffuse and more than what I would expect.  Will check for electrolyte shifts, abnormal thyroid levels, elevated muscle enzyme etc.  If everything is normal consider referral to neurology.  Return in about 6 months (around 04/11/2024) for Hypertension.    Nani Gasser, MD

## 2023-10-14 LAB — CMP14+EGFR
ALT: 23 [IU]/L (ref 0–32)
AST: 26 [IU]/L (ref 0–40)
Albumin: 4 g/dL (ref 3.8–4.9)
Alkaline Phosphatase: 124 [IU]/L — ABNORMAL HIGH (ref 44–121)
BUN/Creatinine Ratio: 11 (ref 9–23)
BUN: 9 mg/dL (ref 6–24)
Bilirubin Total: <0.2 mg/dL (ref 0.0–1.2)
CO2: 22 mmol/L (ref 20–29)
Calcium: 10 mg/dL (ref 8.7–10.2)
Chloride: 103 mmol/L (ref 96–106)
Creatinine, Ser: 0.82 mg/dL (ref 0.57–1.00)
Globulin, Total: 2.5 g/dL (ref 1.5–4.5)
Glucose: 105 mg/dL — ABNORMAL HIGH (ref 70–99)
Potassium: 4.3 mmol/L (ref 3.5–5.2)
Sodium: 145 mmol/L — ABNORMAL HIGH (ref 134–144)
Total Protein: 6.5 g/dL (ref 6.0–8.5)
eGFR: 83 mL/min/{1.73_m2} (ref >59)

## 2023-10-14 LAB — CBC WITH DIFFERENTIAL/PLATELET
Basophils Absolute: 0.1 10*3/uL (ref 0.0–0.2)
Basos: 1 %
EOS (ABSOLUTE): 0.2 10*3/uL (ref 0.0–0.4)
Eos: 2 %
Hematocrit: 45.2 % (ref 34.0–46.6)
Hemoglobin: 13.6 g/dL (ref 11.1–15.9)
Immature Grans (Abs): 0.1 10*3/uL (ref 0.0–0.1)
Immature Granulocytes: 1 %
Lymphocytes Absolute: 3.4 10*3/uL — ABNORMAL HIGH (ref 0.7–3.1)
Lymphs: 28 %
MCH: 26 pg — ABNORMAL LOW (ref 26.6–33.0)
MCHC: 30.1 g/dL — ABNORMAL LOW (ref 31.5–35.7)
MCV: 86 fL (ref 79–97)
Monocytes Absolute: 0.5 10*3/uL (ref 0.1–0.9)
Monocytes: 4 %
Neutrophils Absolute: 7.8 10*3/uL — ABNORMAL HIGH (ref 1.4–7.0)
Neutrophils: 64 %
Platelets: 275 10*3/uL (ref 150–450)
RBC: 5.23 x10E6/uL (ref 3.77–5.28)
RDW: 14.2 % (ref 11.7–15.4)
WBC: 12 10*3/uL — ABNORMAL HIGH (ref 3.4–10.8)

## 2023-10-14 LAB — CK: Total CK: 83 U/L (ref 32–182)

## 2023-10-14 LAB — IRON,TIBC AND FERRITIN PANEL
Ferritin: 35 ng/mL (ref 15–150)
Iron Saturation: 12 % — ABNORMAL LOW (ref 15–55)
Iron: 50 ug/dL (ref 27–159)
Total Iron Binding Capacity: 411 ug/dL (ref 250–450)
UIBC: 361 ug/dL (ref 131–425)

## 2023-10-14 LAB — TSH: TSH: 2.51 u[IU]/mL (ref 0.450–4.500)

## 2023-10-14 LAB — VITAMIN B12

## 2023-10-14 LAB — MAGNESIUM: Magnesium: 1.9 mg/dL (ref 1.6–2.3)

## 2023-10-17 NOTE — Progress Notes (Signed)
HI Terri Zimmerman, your blood count is stable but your iron is trending low. Please start over the counter iron. If it is constipating then please take a stool softener with it. Your alk phos which is a live enzymes is elevated mildly. Not in a worrisome way but I do want to recheck this in 4-6 weeks.  Would you be ok with me referring you to Neurology for the muscle cramps?

## 2023-10-18 NOTE — Progress Notes (Signed)
Please call patient. Normal mammogram.  Repeat in 1 year.  

## 2023-10-19 ENCOUNTER — Telehealth: Payer: Self-pay

## 2023-10-19 ENCOUNTER — Encounter: Payer: Self-pay | Admitting: Neurology

## 2023-10-19 DIAGNOSIS — R252 Cramp and spasm: Secondary | ICD-10-CM

## 2023-10-19 NOTE — Telephone Encounter (Signed)
Copied from CRM [id number]. Topic: Clinical - Medical Advice >> Oct 18, 2023  3:54 PM Clayton Bibles wrote: Reason for CRM: [name] is okay with Doctor referring her to Neurology for the muscle cramps - Please schedule with a doctor as close to [place] as possible.

## 2023-10-19 NOTE — Telephone Encounter (Signed)
Orders Placed This Encounter  Procedures   Ambulatory referral to Neurology    Referral Priority:   Routine    Referral Type:   Consultation    Referral Reason:   Specialty Services Required    Requested Specialty:   Neurology    Number of Visits Requested:   1    

## 2023-10-19 NOTE — Telephone Encounter (Signed)
Pended referral

## 2023-10-20 ENCOUNTER — Telehealth: Payer: Self-pay

## 2023-10-20 ENCOUNTER — Telehealth: Payer: Self-pay | Admitting: Family Medicine

## 2023-10-20 NOTE — Telephone Encounter (Signed)
Task completed. Team msg sent to the imaging dept to expedite reading for the chest x-ray.

## 2023-10-20 NOTE — Telephone Encounter (Signed)
Please call imaging and have them go ahead and bump up the reading for the chest x-ray.

## 2023-10-20 NOTE — Telephone Encounter (Signed)
Advised pt that Radiology has not read the CXR. Pt requests MyChart response when available from Dr. Linford Arnold with results.      Copied from CRM [id number]. Topic: Clinical - Lab/Test Results >> Oct 20, 2023 10:38 AM Terri Zimmerman wrote: Reason for CRM: Pt is calling to check status of chest x-ray result that was done on last week. I informed her that the provider have not review the x-ray yet.

## 2023-10-22 NOTE — Progress Notes (Signed)
Hi Terri Zimmerman, we had to call imaging to read your film. They are running really behind.  Thank you for reaching out.  They said the nodule can barely be seen on the film but they did recommend that we consider doing a CT scan of your chest for follow-up.  If you are okay with that then we will plan to schedule the CT in January.  Just let me know.

## 2023-10-22 NOTE — Telephone Encounter (Signed)
Pt is calling back in she called imaging and was told she has to have orders put in first from the dr in order to schedule the appt she would like a call back regarding this matter.

## 2023-10-25 ENCOUNTER — Other Ambulatory Visit: Payer: Self-pay | Admitting: Family Medicine

## 2023-10-25 DIAGNOSIS — R918 Other nonspecific abnormal finding of lung field: Secondary | ICD-10-CM

## 2023-10-25 DIAGNOSIS — R911 Solitary pulmonary nodule: Secondary | ICD-10-CM

## 2023-10-25 NOTE — Progress Notes (Signed)
Orders Placed This Encounter  Procedures   CT Chest Wo Contrast    Standing Status:   Future    Standing Expiration Date:   10/24/2024    Order Specific Question:   Is patient pregnant?    Answer:   No    Order Specific Question:   Preferred imaging location?    Answer:   [facility]

## 2023-10-26 ENCOUNTER — Telehealth: Payer: Self-pay

## 2023-10-26 DIAGNOSIS — R748 Abnormal levels of other serum enzymes: Secondary | ICD-10-CM

## 2023-10-26 DIAGNOSIS — E538 Deficiency of other specified B group vitamins: Secondary | ICD-10-CM

## 2023-10-26 NOTE — Telephone Encounter (Signed)
Patient informed that imaging orders are in chart.

## 2023-10-26 NOTE — Telephone Encounter (Signed)
Patient has upcoming appt for 11/21/22 to recheck labs  She would like to know if this has to be a scheduled appt with provider or if she can just walk in  for lab work instead?

## 2023-10-27 NOTE — Telephone Encounter (Signed)
Left detailed vm message that patient can have Vit B12 drawn at next appt in January.

## 2023-10-27 NOTE — Telephone Encounter (Signed)
Ok to just go to lab

## 2023-10-27 NOTE — Telephone Encounter (Signed)
What blood work should I order for patient?  She would come in for this testing around first of January as previously scheduled? She also wanted to know if the Vit B12 could be drawn in January or if is needed sooner will come by for this test tomorrow after her CT??

## 2023-10-27 NOTE — Telephone Encounter (Signed)
Orders Placed This Encounter  Procedures   Hepatic function panel   Hepatitis, Acute   Ferritin   B12

## 2023-10-28 ENCOUNTER — Ambulatory Visit: Payer: BC Managed Care – PPO

## 2023-10-28 DIAGNOSIS — J439 Emphysema, unspecified: Secondary | ICD-10-CM | POA: Diagnosis not present

## 2023-10-28 DIAGNOSIS — R918 Other nonspecific abnormal finding of lung field: Secondary | ICD-10-CM

## 2023-10-28 DIAGNOSIS — I700 Atherosclerosis of aorta: Secondary | ICD-10-CM | POA: Diagnosis not present

## 2023-10-28 DIAGNOSIS — R911 Solitary pulmonary nodule: Secondary | ICD-10-CM

## 2023-10-28 DIAGNOSIS — I2583 Coronary atherosclerosis due to lipid rich plaque: Secondary | ICD-10-CM

## 2023-10-28 DIAGNOSIS — R590 Localized enlarged lymph nodes: Secondary | ICD-10-CM | POA: Diagnosis not present

## 2023-10-29 NOTE — Telephone Encounter (Signed)
Left detailed voice mail message =kph

## 2023-11-11 ENCOUNTER — Encounter: Payer: Self-pay | Admitting: Family Medicine

## 2023-11-11 NOTE — Progress Notes (Signed)
Terri Zimmerman, CT of the chest sows that the opacity in both lungs seems to have resolved which is great there is a little bit of residual scarring but it is minor.  The long nodules that were there before are stable all the way back to 2022.  So they appear to be benign and they do not recommend any further follow-up on the those nodules.  Did note that you do have some mild early emphysema.  There is also some plaque formation in your coronary arteries in your aorta.

## 2023-11-22 ENCOUNTER — Ambulatory Visit: Payer: BC Managed Care – PPO | Admitting: Family Medicine

## 2023-11-22 ENCOUNTER — Ambulatory Visit: Payer: BC Managed Care – PPO | Admitting: Neurology

## 2023-11-23 ENCOUNTER — Other Ambulatory Visit: Payer: Self-pay | Admitting: *Deleted

## 2023-11-23 DIAGNOSIS — E538 Deficiency of other specified B group vitamins: Secondary | ICD-10-CM | POA: Diagnosis not present

## 2023-11-23 DIAGNOSIS — Z794 Long term (current) use of insulin: Secondary | ICD-10-CM | POA: Diagnosis not present

## 2023-11-23 DIAGNOSIS — Z1331 Encounter for screening for depression: Secondary | ICD-10-CM | POA: Diagnosis not present

## 2023-11-23 DIAGNOSIS — R748 Abnormal levels of other serum enzymes: Secondary | ICD-10-CM | POA: Diagnosis not present

## 2023-11-23 DIAGNOSIS — E1165 Type 2 diabetes mellitus with hyperglycemia: Secondary | ICD-10-CM | POA: Diagnosis not present

## 2023-11-23 LAB — HEMOGLOBIN A1C: Hemoglobin A1C: 8.7

## 2023-11-24 ENCOUNTER — Encounter: Payer: Self-pay | Admitting: Family Medicine

## 2023-11-24 LAB — FERRITIN: Ferritin: 41 ng/mL (ref 15–150)

## 2023-11-24 LAB — ACUTE VIRAL HEPATITIS (HAV, HBV, HCV)
HCV Ab: NONREACTIVE
Hep A IgM: NEGATIVE
Hep B C IgM: NEGATIVE
Hepatitis B Surface Ag: NEGATIVE

## 2023-11-24 LAB — HEPATIC FUNCTION PANEL
ALT: 24 [IU]/L (ref 0–32)
AST: 22 [IU]/L (ref 0–40)
Albumin: 3.9 g/dL (ref 3.8–4.9)
Alkaline Phosphatase: 121 [IU]/L (ref 44–121)
Bilirubin Total: 0.3 mg/dL (ref 0.0–1.2)
Bilirubin, Direct: 0.14 mg/dL (ref 0.00–0.40)
Total Protein: 6.2 g/dL (ref 6.0–8.5)

## 2023-11-24 LAB — HCV INTERPRETATION

## 2023-11-24 LAB — VITAMIN B12: Vitamin B-12: 871 pg/mL (ref 232–1245)

## 2023-11-24 NOTE — Progress Notes (Signed)
 Hi Ceniyah, you are negative for hepatitis AB and C.

## 2023-11-24 NOTE — Progress Notes (Signed)
 Your lab work is within acceptable range and there are no concerning findings.   ?

## 2023-12-04 ENCOUNTER — Other Ambulatory Visit: Payer: Self-pay | Admitting: Family Medicine

## 2023-12-04 DIAGNOSIS — I10 Essential (primary) hypertension: Secondary | ICD-10-CM

## 2023-12-04 DIAGNOSIS — R053 Chronic cough: Secondary | ICD-10-CM

## 2023-12-27 ENCOUNTER — Ambulatory Visit: Payer: BC Managed Care – PPO | Admitting: Neurology

## 2024-01-17 LAB — HM DIABETES EYE EXAM

## 2024-01-27 ENCOUNTER — Emergency Department (HOSPITAL_BASED_OUTPATIENT_CLINIC_OR_DEPARTMENT_OTHER)

## 2024-01-27 ENCOUNTER — Other Ambulatory Visit: Payer: Self-pay

## 2024-01-27 ENCOUNTER — Emergency Department (HOSPITAL_BASED_OUTPATIENT_CLINIC_OR_DEPARTMENT_OTHER)
Admission: EM | Admit: 2024-01-27 | Discharge: 2024-01-27 | Disposition: A | Discharge status: Home / Self Care | Attending: Emergency Medicine | Admitting: Emergency Medicine

## 2024-01-27 ENCOUNTER — Ambulatory Visit: Payer: Self-pay | Admitting: Family Medicine

## 2024-01-27 ENCOUNTER — Encounter (HOSPITAL_BASED_OUTPATIENT_CLINIC_OR_DEPARTMENT_OTHER): Payer: Self-pay | Admitting: Emergency Medicine

## 2024-01-27 DIAGNOSIS — M79605 Pain in left leg: Secondary | ICD-10-CM | POA: Diagnosis not present

## 2024-01-27 DIAGNOSIS — S92515D Nondisplaced fracture of proximal phalanx of left lesser toe(s), subsequent encounter for fracture with routine healing: Secondary | ICD-10-CM | POA: Diagnosis not present

## 2024-01-27 DIAGNOSIS — L03116 Cellulitis of left lower limb: Secondary | ICD-10-CM | POA: Insufficient documentation

## 2024-01-27 DIAGNOSIS — R0602 Shortness of breath: Secondary | ICD-10-CM | POA: Diagnosis not present

## 2024-01-27 DIAGNOSIS — R918 Other nonspecific abnormal finding of lung field: Secondary | ICD-10-CM | POA: Diagnosis not present

## 2024-01-27 LAB — CBC WITH DIFFERENTIAL/PLATELET
Abs Immature Granulocytes: 0.06 10*3/uL (ref 0.00–0.07)
Basophils Absolute: 0.1 10*3/uL (ref 0.0–0.1)
Basophils Relative: 1 %
Eosinophils Absolute: 0.2 10*3/uL (ref 0.0–0.5)
Eosinophils Relative: 2 %
HCT: 42.3 % (ref 36.0–46.0)
Hemoglobin: 13.4 g/dL (ref 12.0–15.0)
Immature Granulocytes: 1 %
Lymphocytes Relative: 31 %
Lymphs Abs: 3.2 10*3/uL (ref 0.7–4.0)
MCH: 27 pg (ref 26.0–34.0)
MCHC: 31.7 g/dL (ref 30.0–36.0)
MCV: 85.1 fL (ref 80.0–100.0)
Monocytes Absolute: 0.5 10*3/uL (ref 0.1–1.0)
Monocytes Relative: 4 %
Neutro Abs: 6.4 10*3/uL (ref 1.7–7.7)
Neutrophils Relative %: 61 %
Platelets: 254 10*3/uL (ref 150–400)
RBC: 4.97 MIL/uL (ref 3.87–5.11)
RDW: 14.5 % (ref 11.5–15.5)
WBC: 10.3 10*3/uL (ref 4.0–10.5)
nRBC: 0 % (ref 0.0–0.2)

## 2024-01-27 LAB — BASIC METABOLIC PANEL
Anion gap: 10 (ref 5–15)
BUN: 11 mg/dL (ref 6–20)
CO2: 25 mmol/L (ref 22–32)
Calcium: 8.7 mg/dL — ABNORMAL LOW (ref 8.9–10.3)
Chloride: 100 mmol/L (ref 98–111)
Creatinine, Ser: 0.71 mg/dL (ref 0.44–1.00)
GFR, Estimated: >60 mL/min (ref >60)
Glucose, Bld: 283 mg/dL — ABNORMAL HIGH (ref 70–99)
Potassium: 3.4 mmol/L — ABNORMAL LOW (ref 3.5–5.1)
Sodium: 135 mmol/L (ref 135–145)

## 2024-01-27 LAB — LACTIC ACID, PLASMA
Lactic Acid, Venous: 2.2 mmol/L (ref 0.5–1.9)
Lactic Acid, Venous: 1.6 mmol/L (ref 0.5–1.9)

## 2024-01-27 LAB — D-DIMER, QUANTITATIVE: D-Dimer, Quant: <0.27 ug{FEU}/mL (ref 0.00–0.50)

## 2024-01-27 LAB — TROPONIN I (HIGH SENSITIVITY)
Troponin I (High Sensitivity): 3 ng/L (ref <18)
Troponin I (High Sensitivity): 3 ng/L (ref <18)

## 2024-01-27 MED ORDER — SODIUM CHLORIDE 0.9 % IV BOLUS
1000.0000 mL | Freq: Once | INTRAVENOUS | Status: AC
  Administered 2024-01-27: 1000 mL via INTRAVENOUS

## 2024-01-27 MED ORDER — CEFAZOLIN SODIUM-DEXTROSE 2-4 GM/100ML-% IV SOLN
2.0000 g | Freq: Once | INTRAVENOUS | Status: AC
  Administered 2024-01-27: 2 g via INTRAVENOUS
  Filled 2024-01-27: qty 100

## 2024-01-27 MED ORDER — DOXYCYCLINE HYCLATE 100 MG PO CAPS: 100.0000 mg | ORAL_CAPSULE | Freq: Two times a day (BID) | ORAL | 0 refills | Status: DC

## 2024-01-27 NOTE — Telephone Encounter (Signed)
 Copied from CRM [id number]. Topic: Clinical - Red Word Triage >> Jan 27, 2024  1:10 PM Tiffany S wrote: [facility] that prompted transfer to Nurse Triage: Patient is having pain and swelling in Lt foot patient stated swelling has not come down she is worried due to being diabetic Lt calf is swollen and red.   Chief Complaint: Leg swelling  Symptoms: Left foot and calf swelling, redness and pain to the left calf, some shortness of breath  Frequency: Constant  Pertinent Negatives: Patient denies chest pain Disposition: [x] ED /[] Urgent Care (no appt availability in office) / [] Appointment(In office/virtual)/ []  [facility]/ [] Home Care/ [] Refused Recommended Disposition /[] [place]/ []  Follow-up with PCP Additional Notes: Patient reports she injured her left foot 2 months ago and believes she may have broken a toe. She states that she had swelling to her left foot that resolved, but states that about 1 week ago she began to experience the swelling again. She states that the swelling has been worsening and states that she now has swelling, pain, and redness to her left calf. She reports she has also had some shortness of breath for the last week as well. Patient advised that with her symptoms she should go to the ED for evaluation and treatment. Patient verbalized understanding and agreement with this plan.      Reason for Disposition  Difficulty breathing at rest  Answer Assessment - Initial Assessment Questions 1. ONSET: "When did the swelling start?" (e.g., minutes, hours, days)     1 week 2. LOCATION: "What part of the leg is swollen?"  "Are both legs swollen or just one leg?"     Left foot and calf 3. SEVERITY: "How bad is the swelling?" (e.g., localized; mild, moderate, severe)   - Localized: Small area of swelling localized to one leg.   - MILD pedal edema: Swelling limited to foot and ankle, pitting edema < 1/4 inch (6 mm) deep, rest and elevation eliminate most or  all swelling.   - MODERATE edema: Swelling of lower leg to knee, pitting edema > 1/4 inch (6 mm) deep, rest and elevation only partially reduce swelling.   - SEVERE edema: Swelling extends above knee, facial or hand swelling present.      Moderate  4. REDNESS: "Does the swelling look red or infected?"     Yes 5. PAIN: "Is the swelling painful to touch?" If Yes, ask: "How painful is it?"   (Scale 1-10; mild, moderate or severe)     8/10 6. FEVER: "Do you have a fever?" If Yes, ask: "What is it, how was it measured, and when did it start?"      No 7. CAUSE: "What do you think is causing the leg swelling?"     Unsure, injured foot 2 months ago  8. MEDICAL HISTORY: "Do you have a history of blood clots (e.g., DVT), cancer, heart failure, kidney disease, or liver failure?"     No 9. RECURRENT SYMPTOM: "Have you had leg swelling before?" If Yes, ask: "When was the last time?" "What happened that time?"     No 10. OTHER SYMPTOMS: "Do you have any other symptoms?" (e.g., chest pain, difficulty breathing)       Some difficulty breathing  Protocols used: Leg Swelling and Edema-A-AH

## 2024-01-27 NOTE — ED Provider Notes (Addendum)
 [place] [facility] Provider Note   CSN: [id number] Arrival date & time: 01/27/24  1408     History  Chief Complaint  Patient presents with   Shortness of Breath    Terri Zimmerman is a 58 y.o. female.  Patient with a complaint of left leg swelling redness and discomfort from the knee down for a week.  No recent injury but 3 weeks ago did have an injury to her left little toe where she sort of jammed it.  But no open wounds.  Patient also with a complaint of shortness of breath for a week patient normally uses albuterol inhaler she does have a history of asthma also nausea for a week but no vomiting no diarrhea she is a type II diabetic did take Tylenol at 7 this morning with no relief.  Vital signs on presentation 98 pulse 107 respirations 20 blood pressure 149/86 and oxygen saturation is 97%.  Patient denies any history of any congestive heart failure patient not on blood thinners not on a diuretic.  Past medical history significant for hypertension asthma and the diabetes.  Past surgical history significant for abdominal hysterectomy appendectomy patient is an everyday smoker of cigarettes.       Home Medications Prior to Admission medications   Medication Sig Start Date End Date Taking? Authorizing Provider  acetaminophen (TYLENOL) 325 MG tablet Take 650 mg by mouth 2 (two) times daily.    [provider]  albuterol (VENTOLIN HFA) 108 (90 Base) MCG/ACT inhaler Inhale 2-4 puffs into the lungs every 6 (six) hours as needed for wheezing or shortness of breath. 04/06/23   Agapito Games, MD  AMBULATORY NON FORMULARY MEDICATION Medication Name: CPAP supplies hose, humidifier  Dx OSA with AHI 99. [employer]: [phone] 02/02/23   Agapito Games, MD  Continuous Blood Gluc Receiver (DEXCOM G7 RECEIVER) DEVI by Does not apply route. 08/05/22   [provider]  Continuous Glucose Sensor (DEXCOM G7 SENSOR) MISC by Does not apply  route. 01/18/23   [provider]  DULERA 200-5 MCG/ACT AERO INHALE 2 PUFFS INTO THE LUNGS IN THE MORNING AND AT BEDTIME. 09/27/23   Agapito Games, MD  fluticasone (FLONASE) 50 MCG/ACT nasal spray Place 2 sprays into both nostrils daily. 07/07/21 10/07/23  Clayborne Dana, NP  insulin degludec (TRESIBA FLEXTOUCH) 200 UNIT/ML FlexTouch Pen Inject 82 Units into the skin in the morning and at bedtime. Patient taking differently: Inject 160 Units into the skin in the morning. 12/01/21   Shamleffer, Konrad Dolores, MD  insulin lispro (HUMALOG KWIKPEN) 100 UNIT/ML KwikPen Inject 5 Units into the skin daily with breakfast AND 10 Units daily with lunch AND 12 Units daily with supper. Patient taking differently: Inject 5 Units into the skin daily with breakfast AND 10 Units daily with lunch AND 12 Units daily with supper. Continue Humalog 20 units before meals plus your sliding scale 12/01/21   Shamleffer, Konrad Dolores, MD  Insulin Pen Needle (B-D UF III MINI PEN NEEDLES) 31G X 5 MM MISC Inject 1 Device into the skin in the morning, at noon, in the evening, and at bedtime. 12/01/21   Shamleffer, Konrad Dolores, MD  metFORMIN (GLUCOPHAGE-XR) 500 MG 24 hr tablet Take 2 tablets (1,000 mg total) by mouth daily with breakfast AND 1 tablet (500 mg total) daily with supper. Patient taking differently: Take 2 tablets (1,000 mg total) by mouth daily with breakfast AND 2 tablets (1000 mg total) daily with supper.  12/01/21   Shamleffer, Konrad Dolores, MD  metoprolol succinate (TOPROL-XL) 50 MG 24 hr tablet TAKE 1 TABLET BY MOUTH EVERY DAY WITH OR IMMEDIATELY FOLLOWING A MEAL 12/09/23   Agapito Games, MD  Multiple Vitamin (MULTIVITAMIN) tablet Take 1 tablet by mouth daily.    [provider]  Pitavastatin Calcium 2 MG TABS TAKE 1 TABLET BY MOUTH AT BEDTIME. 05/03/23   Agapito Games, MD  valsartan-hydrochlorothiazide (DIOVAN-HCT) 320-12.5 MG tablet TAKE 1 TABLET BY MOUTH EVERY DAY  12/09/23   Agapito Games, MD      Allergies    Trulicity [dulaglutide], Crestor [rosuvastatin], Jentadueto [linagliptin-metformin hcl er], Lipitor [atorvastatin], Onglyza [saxagliptin], Ozempic (0.25 or 0.5 mg-dose) [semaglutide(0.25 or 0.5mg -dos)], and Victoza [liraglutide]    Review of Systems   Review of Systems  Constitutional:  Negative for chills and fever.  HENT:  Negative for ear pain and sore throat.   Eyes:  Negative for pain and visual disturbance.  Respiratory:  Positive for shortness of breath. Negative for cough.   Cardiovascular:  Positive for leg swelling. Negative for chest pain and palpitations.  Gastrointestinal:  Negative for abdominal pain and vomiting.  Genitourinary:  Negative for dysuria and hematuria.  Musculoskeletal:  Negative for arthralgias and back pain.  Skin:  Negative for color change and rash.  Neurological:  Negative for seizures and syncope.  All other systems reviewed and are negative.   Physical Exam Updated Vital Signs BP (!) 149/86   Pulse (!) 107   Temp 98 F (36.7 C)   Resp 20   Ht 1.549 m (5\' 1" )   Wt 90.7 kg   SpO2 97%   BMI 37.79 kg/m  Physical Exam Vitals and nursing note reviewed.  Constitutional:      General: She is not in acute distress.    Appearance: Normal appearance. She is well-developed.  HENT:     Head: Normocephalic and atraumatic.     Mouth/Throat:     Mouth: Mucous membranes are moist.  Eyes:     Extraocular Movements: Extraocular movements intact.     Conjunctiva/sclera: Conjunctivae normal.     Pupils: Pupils are equal, round, and reactive to light.  Cardiovascular:     Rate and Rhythm: Normal rate and regular rhythm.     Heart sounds: No murmur heard. Pulmonary:     Effort: Pulmonary effort is normal. No respiratory distress.     Breath sounds: Wheezing present. No rhonchi or rales.     Comments: Very faint bilateral wheezing Abdominal:     Palpations: Abdomen is soft.     Tenderness: There is  no abdominal tenderness.  Musculoskeletal:        General: Swelling present.     Cervical back: Normal range of motion and neck supple. No rigidity.     Comments: Good cap refill to both big toes on lower extremity.  Left lower extremity with swelling increased erythema and increased warmth from knee below no swelling to the knee joint itself.  Left little toe without any obvious deformity.  Skin:    General: Skin is warm and dry.     Capillary Refill: Capillary refill takes less than 2 seconds.  Neurological:     General: No focal deficit present.     Mental Status: She is alert and oriented to person, place, and time.     Cranial Nerves: No cranial nerve deficit.     Sensory: No sensory deficit.     Motor: No weakness.  Psychiatric:  Mood and Affect: Mood normal.     ED Results / Procedures / Treatments   Labs (all labs ordered are listed, but only abnormal results are displayed) Labs Reviewed  BASIC METABOLIC PANEL - Abnormal; Notable for the following components:      Result Value   Potassium 3.4 (*)    Glucose, Bld 283 (*)    Calcium 8.7 (*)    All other components within normal limits  LACTIC ACID, PLASMA - Abnormal; Notable for the following components:   Lactic Acid, Venous 2.2 (*)    All other components within normal limits  CULTURE, BLOOD (ROUTINE X 2)  CULTURE, BLOOD (ROUTINE X 2)  CBC WITH DIFFERENTIAL/PLATELET  D-DIMER, QUANTITATIVE  TROPONIN I (HIGH SENSITIVITY)    EKG EKG Interpretation Date/Time:  Thursday January 27 2024 14:16:39 EDT Ventricular Rate:  101 PR Interval:  154 QRS Duration:  87 QT Interval:  336 QTC Calculation: 436 R Axis:   -77  Text Interpretation: Sinus tachycardia Left anterior fascicular block Anterior infarct, old No significant change since last tracing Confirmed by Elayne Snare ([id number]) on 01/27/2024 2:24:11 PM  Radiology DG Chest 2 View Result Date: 01/27/2024 CLINICAL DATA:  Shortness of breath EXAM: CHEST - 2 VIEW  COMPARISON:  X-ray 10/13/2023 and older.  CT noncontrast 10/28/2023. FINDINGS: No consolidation, pneumothorax or effusion. There is some linear opacity left lung base likely scar or atelectasis. Normal cardiopericardial silhouette. Mild degenerative changes along the spine. IMPRESSION: Mild linear opacity left lung base likely scar or atelectasis. No consolidation. Electronically Signed   By: Karen Kays M.D.   On: 01/27/2024 15:29   US Venous Img Lower  Left (DVT Study) Result Date: 01/27/2024 CLINICAL DATA:  Left leg pain. EXAM: Left LOWER EXTREMITY VENOUS DOPPLER ULTRASOUND TECHNIQUE: Gray-scale sonography with compression, as well as color and duplex ultrasound, were performed to evaluate the deep venous system(s) from the level of the common femoral vein through the popliteal and proximal calf veins. COMPARISON:  03/25/2023 FINDINGS: VENOUS Normal compressibility of the common femoral, superficial femoral, and popliteal veins, as well as the visualized calf veins. Visualized portions of profunda femoral vein and great saphenous vein unremarkable. No filling defects to suggest DVT on grayscale or color Doppler imaging. Doppler waveforms show normal direction of venous flow, normal respiratory plasticity and response to augmentation. Limited views of the contralateral common femoral vein are unremarkable. OTHER None. Limitations: none IMPRESSION: No evidence of left lower extremity DVT. Electronically Signed   By: Karen Kays M.D.   On: 01/27/2024 15:27    Procedures Procedures    Medications Ordered in ED Medications  ceFAZolin (ANCEF) IVPB 2g/100 mL premix (has no administration in time range)  sodium chloride 0.9 % bolus 1,000 mL (1,000 mLs Intravenous New Bag/Given 01/27/24 1536)    ED Course/ Medical Decision Making/ A&P                                 Medical Decision Making Amount and/or Complexity of Data Reviewed Labs: ordered. Radiology: ordered.  Risk Prescription drug  management.   Patient appears to have cellulitis component to the left lower extremity from the knee below.  No obvious wound.  Patient's vital signs not consistent with sepsis other than heart rate 107 her lactic acid was slightly elevated at 2.2 but white count is 10.3.  No hypotension no fever.  Troponin was 3 D-dimer normal at less than 0.27 ultrasound of  the left lower extremity showed no evidence of any lower extremity DVTs.  X-ray of the lungs mild linear opacity left base likely scar atelectasis no consolidation nothing consistent with a pneumonia.  Patient's EKG without any acute changes.  Medically it seems like there is definitely a component of cellulitis we will going give a dose of Ancef will get blood cultures and will do 1 L of fluid and follow-up on the lactic acid.  Patient's basic metabolic panel sodium 135 potassium 3.4 glucose 284 but CO2 is 25 so does not appear to be acidotic renal function normal BUN and creatinine normal.  At this point do not feel patient is meeting any sepsis criteria but I do feel that there is a cellulitis to the left lower extremity.  Her shortness of breath I think is due to a little bit of an exacerbation of asthma patient does have an albuterol inhaler patient's oxygen saturations are very good.  Repeat lactic acid was 1.6.  Which is very reassuring blood cultures are pending.  Will reevaluate patient.  Patient will require probably oral antibiotics would be ideal for something that does cover staph so may be doxycycline if she is dischargeable.  Patient is lactic acid improved to 1.6 as stated above.  X-ray does show evidence of a nondisplaced subacute fracture of the fifth toe.  Patient could buddy tape this.  Or wear firm shoe.   Final Clinical Impression(s) / ED Diagnoses Final diagnoses:  Cellulitis of left lower extremity    Rx / DC Orders ED Discharge Orders     None         Vanetta Mulders, MD 01/27/24 1540    Vanetta Mulders, MD 01/27/24 Herbie Baltimore    Vanetta Mulders, MD 01/27/24 2017

## 2024-01-27 NOTE — ED Notes (Signed)
 Assumed care of patient at this time. Report received from [place], [place].

## 2024-01-27 NOTE — Discharge Instructions (Addendum)
 Take the antibiotic doxycycline as directed for the next 7 days.  Would expect some improvement after a couple days.  Return for any new or worse symptoms.  Radiologist does think there is a subacute fracture to the left fifth little toe.  Buddy taping it to the toe next to it or wearing firm shoe would be appropriate.  Already showing signs of healing so this probably happened a while ago.

## 2024-01-27 NOTE — ED Triage Notes (Signed)
 Pt POV in cam boot on L foot- reports breaking toe one month ago, new swelling x1 week, increased pain. Redness from L Knee down to foot.  Denies drainage.   C/o increased shob x1 week. Nausea x1 week, no emesis.  Hx of asthma. DM1.  Tylenol taken appx 0700, no relief.

## 2024-01-28 ENCOUNTER — Telehealth: Payer: Self-pay

## 2024-01-28 DIAGNOSIS — R059 Cough, unspecified: Secondary | ICD-10-CM

## 2024-01-28 MED ORDER — DULERA 200-5 MCG/ACT IN AERO
2.0000 | INHALATION_SPRAY | Freq: Two times a day (BID) | RESPIRATORY_TRACT | 1 refills | Status: AC
Start: 2024-01-28 — End: ?

## 2024-01-28 NOTE — Telephone Encounter (Signed)
 Copied from CRM [id number]. Topic: Clinical - Prescription Issue >> Jan 28, 2024  8:40 AM Marlow Baars wrote: Reason for CRM: The patient called in stating she has been without her DULERA 200-5 MCG/ACT AERO for about a week and she needs it as soon as possible. She states it needs a prior authorization through her insurance and she really needs this as soon as possible. She states even if there is another med comparable that may not require a prior auth to please call that in to her pharmacy so she can get it by this weekend sometime. She also states she was in the ED for cellulitis and was told by the ER doctor that the albuterol (VENTOLIN HFA) 108 (90 Base) MCG/ACT inhaler is not a good maintenance inhaler. She also had shortness of breath while there. She was prescribed an antibiotic . Please assist further  [id number] [place], [place] - [address]  Phone: [phone] Fax: [phone]

## 2024-01-28 NOTE — Telephone Encounter (Signed)
 Forwarding message to [facility] covering Dr. Linford Arnold

## 2024-02-01 LAB — CULTURE, BLOOD (ROUTINE X 2)
Culture: NO GROWTH
Culture: NO GROWTH
Special Requests: ADEQUATE
Special Requests: ADEQUATE

## 2024-02-02 ENCOUNTER — Ambulatory Visit (INDEPENDENT_AMBULATORY_CARE_PROVIDER_SITE_OTHER): Payer: BC Managed Care – PPO | Admitting: Neurology

## 2024-02-02 ENCOUNTER — Encounter: Payer: Self-pay | Admitting: Neurology

## 2024-02-02 ENCOUNTER — Telehealth: Payer: Self-pay

## 2024-02-02 VITALS — BP 133/76 | HR 98 | Ht 61.0 in | Wt 209.0 lb

## 2024-02-02 DIAGNOSIS — R252 Cramp and spasm: Secondary | ICD-10-CM | POA: Diagnosis not present

## 2024-02-02 DIAGNOSIS — E1142 Type 2 diabetes mellitus with diabetic polyneuropathy: Secondary | ICD-10-CM

## 2024-02-02 DIAGNOSIS — I951 Orthostatic hypotension: Secondary | ICD-10-CM | POA: Diagnosis not present

## 2024-02-02 DIAGNOSIS — R2681 Unsteadiness on feet: Secondary | ICD-10-CM

## 2024-02-02 NOTE — Progress Notes (Signed)
 [facility] Clinic Note - Initial Visit   Date: 02/02/2024   Terri Zimmerman MRN: [id number] DOB: Oct 17, 1966   Dear Dr. Linford Arnold:   Thank you for your kind referral of Terri Zimmerman for consultation of muscle cramps. Although her history is well known to you, please allow [place] to reiterate it for the purpose of our medical record. The patient was accompanied to the clinic by self.    Terri Zimmerman is a 58 y.o. right-handed female with poorly controlled insulin-dependent diabetes mellitus, hypertension, and asthma presenting for evaluation of muscle cramps.   IMPRESSION: Muscle cramps, benign - possibly related to diuretics. Fortunately cramps are less frequent now.  Diabetic neuropathy manifesting with distal paresthesias and sensory ataxia. Orthostatic hypotension with drop in DBP > 10 points.  Recommend staying well-hydrated, wear compression stockings and discuss with PCP about BP medications.  Right side head pressure. No worrisome findings on exam to suggest intracranial pathology.  Reassurance provided.   PLAN: NCS/EMG was offered to further evaluation symptoms.  She declined testing and will contact my office, if she would like to proceed with it. Recommend PT for gait training  Return to clinic as needed  ------------------------------------------------------------- History of present illness: Starting around the fall of 2024, she began having muscle cramps involving the legs, thighs, and in the arms.  She occasionally gets twitching in the hands.  Cramps are less frequent and has them once every few weeks.  It lasts less than 10 minutes and improved with stretching the area.  Her electrolytes and TSH is within normal limits.  No associated weakness.  She has chronic low back pain.   She complains of right side of her head is full and heavy.  She denies headaches.  She also complains of spells of vertigo.  She has numbness/tingling of the feet and  hands. She has imbalance and has been falling at night time.  She also has unsteadiness when the ground is uneven. She feels as if she is rocking, especially when standing up. She has been diabetic for 15+ years, last HbA1c is 8.9.    She works in [employer].  She smokes half-pack for the past 40 years. No alcohol use.  She lives at home with husband.    Out-side paper records, electronic medical record, and images have been reviewed where available and summarized as:  Lab Results  Component Value Date   HGBA1C 8.9 08/19/2023   Lab Results  Component Value Date   VITAMINB12 871 11/23/2023   Lab Results  Component Value Date   TSH 2.510 10/13/2023   Lab Results  Component Value Date   ESRSEDRATE 9 04/26/2020    Past Medical History:  Diagnosis Date   Asthma 04-25-07   Diabetes mellitus 11-12-06   type 2   Hypertension 11-04-06    Past Surgical History:  Procedure Laterality Date   ABDOMINAL HYSTERECTOMY  10/2010   Complete   APPENDECTOMY  07/1999   BREAST BIOPSY Left 02/1999   BREAST LUMPECTOMY WITH AXILLARY LYMPH NODE DISSECTION Left 10/2010   FOOT TENDON SURGERY  05/16/2012   bone spur.     LAPAROSCOPY  02/2007   for cyst and endometriosis     Medications:  Outpatient Encounter Medications as of 02/02/2024  Medication Sig   acetaminophen (TYLENOL) 325 MG tablet Take 650 mg by mouth 2 (two) times daily.   albuterol (VENTOLIN HFA) 108 (90 Base) MCG/ACT inhaler Inhale 2-4 puffs into the lungs every 6 (six)  hours as needed for wheezing or shortness of breath.   AMBULATORY NON FORMULARY MEDICATION Medication Name: CPAP supplies hose, humidifier  Dx OSA with AHI 99. [employer]: [phone]   Continuous Blood Gluc Receiver (DEXCOM G7 RECEIVER) DEVI by Does not apply route.   Continuous Glucose Sensor (DEXCOM G7 SENSOR) MISC by Does not apply route.   doxycycline (VIBRAMYCIN) 100 MG capsule Take 1 capsule (100 mg total) by mouth 2 (two) times daily.   fluticasone  (FLONASE) 50 MCG/ACT nasal spray Place 2 sprays into both nostrils daily.   insulin degludec (TRESIBA FLEXTOUCH) 200 UNIT/ML FlexTouch Pen Inject 82 Units into the skin in the morning and at bedtime. (Patient taking differently: Inject 160 Units into the skin in the morning.)   insulin lispro (HUMALOG KWIKPEN) 100 UNIT/ML KwikPen Inject 5 Units into the skin daily with breakfast AND 10 Units daily with lunch AND 12 Units daily with supper. (Patient taking differently: Inject 5 Units into the skin daily with breakfast AND 10 Units daily with lunch AND 12 Units daily with supper. Continue Humalog 20 units before meals plus your sliding scale)   Insulin Pen Needle (B-D UF III MINI PEN NEEDLES) 31G X 5 MM MISC Inject 1 Device into the skin in the morning, at noon, in the evening, and at bedtime.   metFORMIN (GLUCOPHAGE-XR) 500 MG 24 hr tablet Take 2 tablets (1,000 mg total) by mouth daily with breakfast AND 1 tablet (500 mg total) daily with supper. (Patient taking differently: Take 2 tablets (1,000 mg total) by mouth daily with breakfast AND 2 tablets (1000 mg total) daily with supper.)   metoprolol succinate (TOPROL-XL) 50 MG 24 hr tablet TAKE 1 TABLET BY MOUTH EVERY DAY WITH OR IMMEDIATELY FOLLOWING A MEAL   mometasone-formoterol (DULERA) 200-5 MCG/ACT AERO Inhale 2 puffs into the lungs 2 (two) times daily. in the morning and at bedtime.   Multiple Vitamin (MULTIVITAMIN) tablet Take 1 tablet by mouth daily.   Pitavastatin Calcium 2 MG TABS TAKE 1 TABLET BY MOUTH AT BEDTIME.   valsartan-hydrochlorothiazide (DIOVAN-HCT) 320-12.5 MG tablet TAKE 1 TABLET BY MOUTH EVERY DAY   No facility-administered encounter medications on file as of 02/02/2024.    Allergies:  Allergies  Allergen Reactions   Trulicity [Dulaglutide] Nausea Only    Abdominal pain,   Crestor [Rosuvastatin] Nausea Only   Jentadueto [Linagliptin-Metformin Hcl Er]     Abdominal pains and diarrhea   Lipitor [Atorvastatin]     Sudden hair  loss 2 weeks after starting lipitor.    Onglyza [Saxagliptin] Other (See Comments)    Pancreatitis   Ozempic (0.25 Or 0.5 Mg-Dose) [Semaglutide(0.25 Or 0.5mg -Dos)] Diarrhea and Other (See Comments)    Abdominal pain   Victoza [Liraglutide] Other (See Comments)    Abdominal pain and nausea.     Family History: Family History  Problem Relation Age of Onset   Other Mother        RA and CHF   Rheum arthritis Mother    Congestive Heart Failure Mother    Diabetes Father    Diabetes Other    Heart disease Other     Social History: Social History   Tobacco Use   Smoking status: Every Day    Current packs/day: 0.50    Types: Cigarettes   Smokeless tobacco: Never   Tobacco comments:    Smokes a quarter of a pack a day   Vaping Use   Vaping status: Never Used  Substance Use Topics   Alcohol use: No  Drug use: No   Social History   Social History Narrative   Are you right handed or left handed? Right Handed    Are you currently employed ? 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 No    Who lives with you? Husband    What type of home do you live in: 1 story or 2 story? Lives in a one story home        Vital Signs:  BP 133/76   Pulse 98   Ht 5\' 1"  (1.549 m)   Wt 209 lb (94.8 kg)   SpO2 98%   BMI 39.49 kg/m  Orthostatic VS for the past 72 hrs (Last 3 readings):  Orthostatic BP Patient Position BP Location Orthostatic Pulse  02/02/24 0855 102/77 Standing Left Arm 95  02/02/24 0854 100/78 Sitting Left Arm 94  02/02/24 0853 92/60 Supine Left Arm 89     Neurological Exam: MENTAL STATUS including orientation to time, place, person, recent and remote memory, attention span and concentration, language, and fund of knowledge is normal.  Speech is not dysarthric.  CRANIAL NERVES: II:  No visual field defects.     III-IV-VI: Pupils equal round and reactive to light.  Normal conjugate, extra-ocular eye movements in all  directions of gaze.  No nystagmus.  No ptosis.   V:  Normal facial sensation.    VII:  Normal facial symmetry and movements.   VIII:  Normal hearing and vestibular function.   IX-X:  Normal palatal movement.   XI:  Normal shoulder shrug and head rotation.   XII:  Normal tongue strength and range of motion, no deviation or fasciculation.  MOTOR:  No atrophy, fasciculations or abnormal movements.  No pronator drift.   Upper Extremity:  Right  Left  Deltoid  5/5   5/5   Biceps  5/5   5/5   Triceps  5/5   5/5   Wrist extensors  5/5   5/5   Wrist flexors  5/5   5/5   Finger extensors  5/5   5/5   Finger flexors  5/5   5/5   Dorsal interossei  5/5   5/5   Abductor pollicis  5/5   5/5   Tone (Ashworth scale)  0  0   Lower Extremity:  Right  Left  Hip flexors  5/5   5/5   Knee flexors  5/5   5/5   Knee extensors  5/5   5/5   Dorsiflexors  5/5   5/5   Plantarflexors  5/5   5/5   Toe extensors  5/5   5/5   Toe flexors  5/5   5/5   Tone (Ashworth scale)  0  0   MSRs:                                           Right        Left brachioradialis 2+  2+  biceps 2+  2+  triceps 2+  2+  patellar 2+  2+  ankle jerk 1+  1+  Hoffman no  no  plantar response down  down   SENSORY:  Reduced temperature in the feet.  Vibration and pin prick intact.  There is mild sway with Rhomberg testing.  COORDINATION/GAIT: Normal finger-to- nose-finger.  Intact rapid  alternating movements bilaterally. Gait is mildly wide-based, unassisted, stable.  Stressed gait intact.  Unable to perform tandem gait.     Thank you for allowing me to participate in patient's care.  If I can answer any additional questions, I would be pleased to do so.    Sincerely,    Vivica Dobosz K. Allena Katz, DO

## 2024-02-02 NOTE — Telephone Encounter (Signed)
 Pls let pt know that bc her BP did drop, I want her to increase hydration, wear compression stockings and discuss with PCP about whether her BP medications needs adjustment.     Called patient and informed her of above. Patient verbalized understanding and had no further questions or concerns.

## 2024-02-02 NOTE — Telephone Encounter (Signed)
 Pt called in checking status of PA for med, DULERA 200-5 MCG

## 2024-02-02 NOTE — Patient Instructions (Signed)
 If you would like to do nerve testing, please let me know  We will refer you to physical therapy

## 2024-02-03 ENCOUNTER — Telehealth: Payer: Self-pay

## 2024-02-03 NOTE — Telephone Encounter (Signed)
 Terri Zimmerman (Key: [id number]) PA Case ID #: [id number] Rx #: [id number]   Additional Information Required This request has been approved using information available on the patient's profile. [id number];Review Type:Prior Auth;Coverage Start Date:01/04/2024;Coverage End Date:02/02/2025;

## 2024-02-09 NOTE — Telephone Encounter (Signed)
 This request has been approved. [id number]:[id number];Review Type:Prior Auth;Coverage Start Date:01/04/2024;Coverage End Date:02/02/2025. Patient notified via MyChart message.

## 2024-02-10 ENCOUNTER — Other Ambulatory Visit: Payer: Self-pay

## 2024-02-10 ENCOUNTER — Ambulatory Visit (INDEPENDENT_AMBULATORY_CARE_PROVIDER_SITE_OTHER): Admitting: Podiatry

## 2024-02-10 ENCOUNTER — Ambulatory Visit (INDEPENDENT_AMBULATORY_CARE_PROVIDER_SITE_OTHER)

## 2024-02-10 DIAGNOSIS — M7752 Other enthesopathy of left foot: Secondary | ICD-10-CM

## 2024-02-10 DIAGNOSIS — M778 Other enthesopathies, not elsewhere classified: Secondary | ICD-10-CM

## 2024-02-10 DIAGNOSIS — M79672 Pain in left foot: Secondary | ICD-10-CM | POA: Diagnosis not present

## 2024-02-10 DIAGNOSIS — S92515A Nondisplaced fracture of proximal phalanx of left lesser toe(s), initial encounter for closed fracture: Secondary | ICD-10-CM | POA: Diagnosis not present

## 2024-02-10 DIAGNOSIS — M779 Enthesopathy, unspecified: Secondary | ICD-10-CM | POA: Diagnosis not present

## 2024-02-10 MED ORDER — METHYLPREDNISOLONE 4 MG PO TBPK: ORAL_TABLET | ORAL | 0 refills | Status: DC

## 2024-02-10 NOTE — Progress Notes (Signed)
  Subjective:  Patient ID: Terri Zimmerman, female    DOB: [birth year],   MRN: [id number]  No chief complaint on file.   58 y.o. female presents for concern of fifth toe pain on left foot that has been ongoing for several months. Relates a few months ago she broke her toe. Relates since then the pain has been worsening and now is all across her foot and into her ankle. She has tried wearing a boot and taken anti-inflammatories. She has a history of lower back pain and is diabetic with some neuropathy symptoms.  . Denies any other pedal complaints. Denies n/v/f/c.   Past Medical History:  Diagnosis Date   Asthma 04-25-07   Diabetes mellitus 11-12-06   type 2   Hypertension 11-04-06    Objective:  Physical Exam: Vascular: DP/PT pulses 2/4 bilateral. CFT <3 seconds. Normal hair growth on digits. No edema.  Skin. No lacerations or abrasions bilateral feet.  Musculoskeletal: MMT 5/5 bilateral lower extremities in DF, PF, Inversion and Eversion. Deceased ROM in DF of ankle joint. Tender to the fifth toe area but tender along the whole ball of the foot and digits as well is into the ankle and leg. No one specifica area for pain noted. Mild edema noted. No erythema.  Neurological: Sensation intact to light touch.   Assessment:   1. Capsulitis of left foot      Plan:  Patient was evaluated and treated and all questions answered. -Xrays reviewed. No acute fractures or dislocatiosn. Fifth digit fracture healed.  -Discussed treatement options for  possible systemic arthritis and gout and RA education provided. -Rx Medrol dose pack provided.  -Ordered arthritic lab panel; will call patient with results if abnormal -Advised patient to call if symptoms are not improved within 1 week -Patient to return in 3 weeks for re-check/further discussion for long term management of gout or sooner if condition worsens.   Louann Sjogren, DPM

## 2024-02-11 LAB — ARTHRITIS PANEL
Anti Nuclear Antibody (ANA): NEGATIVE
Rheumatoid fact SerPl-aCnc: <10 [IU]/mL (ref <14.0)
Sed Rate: 32 mm/h (ref 0–40)
Uric Acid: 4 mg/dL (ref 3.0–7.2)

## 2024-02-14 ENCOUNTER — Encounter: Payer: Self-pay | Admitting: Family Medicine

## 2024-02-14 ENCOUNTER — Ambulatory Visit (INDEPENDENT_AMBULATORY_CARE_PROVIDER_SITE_OTHER): Admitting: Family Medicine

## 2024-02-14 VITALS — BP 131/80 | HR 85 | Ht 61.0 in | Wt 207.0 lb

## 2024-02-14 DIAGNOSIS — M79672 Pain in left foot: Secondary | ICD-10-CM | POA: Diagnosis not present

## 2024-02-14 DIAGNOSIS — Z794 Long term (current) use of insulin: Secondary | ICD-10-CM

## 2024-02-14 DIAGNOSIS — E1165 Type 2 diabetes mellitus with hyperglycemia: Secondary | ICD-10-CM | POA: Diagnosis not present

## 2024-02-14 NOTE — Assessment & Plan Note (Addendum)
 Diabetes follow-up with endocrinology coming up soon.  She was started on glipizide about 2 months ago and just has not felt great since then and she is even put on a little weight of weight since starting it.

## 2024-02-14 NOTE — Progress Notes (Signed)
 Established Patient Office Visit  Subjective  Patient ID: Terri Zimmerman, female    DOB: 09/23/1966  Age: 58 y.o. MRN: [id number]  Chief Complaint  Patient presents with   Hospitalization Follow-up    HPI  Here for hospital follow-up for cellulitis of the left lower extremity she was seen in the emergency department on March 13 and prescribed doxycycline, after ruling out a DVT.  Lactic acid was elevated at 2.2.  Glucose was 283 while she was in the emergency department her potassium was also a little low and her calcium was a little bit low.  She says it really did not get better and so ended up seeing the podiatrist.  I addressed ended up putting her on a round of prednisone and the swelling has gone down.  Though she still has some pain.  They did do some additional blood test at the end of last week so she has not heard back on those results yet.  They are already in the system and the uric acid was 4 ANA was negative rheumatoid was undetectable and sed rate was 32.  Shooting pain in her foot intermittently even today she is having a lot of pain in the side of the foot and over her toes and the back of her heel even though the swelling has gone down significantly..   Diabetes-she just has not felt great since being started on glipizide about 2 months ago.  Unfortunately she cannot take the GLP-1 class.      ROS    Objective:     BP 131/80   Pulse 85   Ht 5\' 1"  (1.549 m)   Wt 207 lb (93.9 kg)   SpO2 96%   BMI 39.11 kg/m    Physical Exam Vitals and nursing note reviewed.  Constitutional:      Appearance: Normal appearance.  HENT:     Head: Normocephalic and atraumatic.  Eyes:     Conjunctiva/sclera: Conjunctivae normal.  Cardiovascular:     Rate and Rhythm: Normal rate and regular rhythm.  Pulmonary:     Effort: Pulmonary effort is normal.     Breath sounds: Normal breath sounds.  Musculoskeletal:     Comments: Foot and ankle with no swelling.  Dorsal pedal pulse 2+  posterior tibial pulse 2+.  Good skin color no skin rashes etc. no wounds and no erythema.  Skin:    General: Skin is warm and dry.  Neurological:     Mental Status: She is alert.  Psychiatric:        Mood and Affect: Mood normal.      No results found for any visits on 02/14/24.    The ASCVD Risk score (Arnett DK, et al., 2019) failed to calculate for the following reasons:   The valid total cholesterol range is 130 to 320 mg/dL    Assessment & Plan:   Problem List Items Addressed This Visit       Endocrine   Type 2 diabetes mellitus with hyperglycemia, with long-term current use of insulin (HCC) - Primary   Diabetes follow-up with endocrinology coming up soon.  She was started on glipizide about 2 months ago and just has not felt great since then and she is even put on a little weight of weight since starting it.      Relevant Medications   glipiZIDE (GLUCOTROL XL) 5 MG 24 hr tablet   Other Visit Diagnoses       Left foot pain  Left foot pain-swelling has gone down significantly after a round of prednisone still unclear etiology what may have triggered it.  She is gena be going back to work tomorrow just encouraged her to keep it elevated when she can and to get up and get moving.  Again labs are negative so no sign of autoimmune disease at least at this point.  I think next best step would be to get scheduled for her nerve conduction studies she did do a consultation with neurology already and that was what they had recommended.  No follow-ups on file.    Nani Gasser, MD

## 2024-02-22 DIAGNOSIS — E1165 Type 2 diabetes mellitus with hyperglycemia: Secondary | ICD-10-CM | POA: Diagnosis not present

## 2024-02-22 DIAGNOSIS — Z978 Presence of other specified devices: Secondary | ICD-10-CM | POA: Diagnosis not present

## 2024-02-22 DIAGNOSIS — Z794 Long term (current) use of insulin: Secondary | ICD-10-CM | POA: Diagnosis not present

## 2024-03-06 ENCOUNTER — Other Ambulatory Visit: Payer: Self-pay | Admitting: Family Medicine

## 2024-03-06 DIAGNOSIS — I10 Essential (primary) hypertension: Secondary | ICD-10-CM

## 2024-03-08 ENCOUNTER — Other Ambulatory Visit: Payer: Self-pay

## 2024-03-08 ENCOUNTER — Emergency Department (HOSPITAL_BASED_OUTPATIENT_CLINIC_OR_DEPARTMENT_OTHER)
Admission: EM | Admit: 2024-03-08 | Discharge: 2024-03-08 | Disposition: A | Discharge status: Home / Self Care | Attending: Emergency Medicine | Admitting: Emergency Medicine

## 2024-03-08 ENCOUNTER — Emergency Department (HOSPITAL_BASED_OUTPATIENT_CLINIC_OR_DEPARTMENT_OTHER)

## 2024-03-08 ENCOUNTER — Encounter (HOSPITAL_BASED_OUTPATIENT_CLINIC_OR_DEPARTMENT_OTHER): Payer: Self-pay

## 2024-03-08 DIAGNOSIS — R0789 Other chest pain: Secondary | ICD-10-CM | POA: Diagnosis not present

## 2024-03-08 DIAGNOSIS — M545 Low back pain, unspecified: Secondary | ICD-10-CM | POA: Diagnosis not present

## 2024-03-08 DIAGNOSIS — Z79899 Other long term (current) drug therapy: Secondary | ICD-10-CM | POA: Diagnosis not present

## 2024-03-08 DIAGNOSIS — M7918 Myalgia, other site: Secondary | ICD-10-CM | POA: Diagnosis not present

## 2024-03-08 DIAGNOSIS — M791 Myalgia, unspecified site: Secondary | ICD-10-CM | POA: Diagnosis not present

## 2024-03-08 DIAGNOSIS — M48061 Spinal stenosis, lumbar region without neurogenic claudication: Secondary | ICD-10-CM | POA: Diagnosis not present

## 2024-03-08 DIAGNOSIS — E119 Type 2 diabetes mellitus without complications: Secondary | ICD-10-CM | POA: Diagnosis not present

## 2024-03-08 DIAGNOSIS — Z794 Long term (current) use of insulin: Secondary | ICD-10-CM | POA: Diagnosis not present

## 2024-03-08 DIAGNOSIS — J9811 Atelectasis: Secondary | ICD-10-CM | POA: Diagnosis not present

## 2024-03-08 DIAGNOSIS — I10 Essential (primary) hypertension: Secondary | ICD-10-CM | POA: Diagnosis not present

## 2024-03-08 DIAGNOSIS — M5136 Other intervertebral disc degeneration, lumbar region with discogenic back pain only: Secondary | ICD-10-CM | POA: Diagnosis not present

## 2024-03-08 MED ORDER — OXYCODONE-ACETAMINOPHEN 5-325 MG PO TABS
1.0000 | ORAL_TABLET | Freq: Once | ORAL | Status: AC
  Administered 2024-03-08: 1 via ORAL
  Filled 2024-03-08: qty 1

## 2024-03-08 MED ORDER — METAXALONE 800 MG PO TABS: 800.0000 mg | ORAL_TABLET | Freq: Three times a day (TID) | ORAL | 0 refills | Status: DC

## 2024-03-08 MED ORDER — LIDOCAINE 5 % EX PTCH
2.0000 | MEDICATED_PATCH | CUTANEOUS | Status: DC
  Administered 2024-03-08: 2 via TRANSDERMAL
  Filled 2024-03-08: qty 2

## 2024-03-08 MED ORDER — LIDOCAINE 5 % EX PTCH: 2.0000 | MEDICATED_PATCH | CUTANEOUS | 0 refills | Status: DC

## 2024-03-08 NOTE — ED Triage Notes (Signed)
 Pt reports she sat up to get out of bed Monday morning. Fell back to sleep and then fell out of bed and hit left side. This morning coughed and sneezed which increased the pain and now pain lower back radiating to left hip area

## 2024-03-08 NOTE — ED Notes (Signed)
 The pt advised she has been "rolling out of bed" confused and falling more frequently. The pt advised she falls asleep at her desk at work as well, and does not sleep well at night. The pt informed staff she doesn't wear her CPAP and has not for a while and she's supposed to. The pt was strongly encouraged to begin using her CPAP again and was informed of the risks of severe cardiac illness and chronic problem developing due to noncompliance.

## 2024-03-08 NOTE — Discharge Instructions (Addendum)
 Today you were seen for left side and left low back pain.  Please pick up your medications and take as prescribed.  You may also alternate taking Tylenol  and Motrin as needed for pain.  Thank you for letting us  treat you today. After reviewing your imaging, I feel you are safe to go home. Please follow up with your PCP in the next several days and provide them with your records from this visit. Return to the Emergency Room if pain becomes severe or symptoms worsen.

## 2024-03-08 NOTE — ED Provider Notes (Signed)
 [place] [facility] Provider Note   CSN: [id number] Arrival date & time: 03/08/24  7829     History  Chief Complaint  Patient presents with   Back Pain    Terri Zimmerman is a 58 y.o. female past medical history significant for diabetes hypertension presents today for left side and lower back pain after falling out of bed on Monday morning.  Patient states that her pain is worse with coughing, sneezing, and deep inspiration.  Patient also reports pain in her left lower back radiating to her hip.  Patient denies numbness, tingling, loss of bowel or bladder, fever, chills, bruising, head injury or blood thinner use.   Back Pain      Home Medications Prior to Admission medications   Medication Sig Start Date End Date Taking? Authorizing Provider  lidocaine  (LIDODERM ) 5 % Place 2 patches onto the skin daily. Remove & Discard patch within 12 hours or as directed by MD 03/08/24  Yes Kerry-Anne Mezo N, PA-C  metaxalone  (SKELAXIN ) 800 MG tablet Take 1 tablet (800 mg total) by mouth 3 (three) times daily. 03/08/24  Yes Carie Charity, PA-C  acetaminophen  (TYLENOL ) 325 MG tablet Take 650 mg by mouth 2 (two) times daily.    [provider]  albuterol  (VENTOLIN  HFA) 108 (90 Base) MCG/ACT inhaler Inhale 2-4 puffs into the lungs every 6 (six) hours as needed for wheezing or shortness of breath. 04/06/23   Cydney Draft, MD  AMBULATORY NON FORMULARY MEDICATION Medication Name: CPAP supplies hose, humidifier  Dx OSA with AHI 99. [employer]: [phone] 02/02/23   Cydney Draft, MD  Continuous Blood Gluc Receiver (DEXCOM G7 RECEIVER) DEVI by Does not apply route. 08/05/22   [provider]  Continuous Glucose Sensor (DEXCOM G7 SENSOR) MISC by Does not apply route. 01/18/23   [provider]  cyanocobalamin  (VITAMIN B12) 500 MCG tablet Take 500 mcg by mouth daily.    [provider]  ferrous sulfate 324 MG TBEC Take 324 mg  by mouth daily with breakfast.    [provider]  fluticasone  (FLONASE ) 50 MCG/ACT nasal spray Place 2 sprays into both nostrils daily. 07/07/21 02/13/25  Everlina Hock, NP  glipiZIDE  (GLUCOTROL  XL) 5 MG 24 hr tablet Take 5 mg by mouth daily with breakfast. 11/23/23   [provider]  insulin  degludec (TRESIBA  FLEXTOUCH) 200 UNIT/ML FlexTouch Pen Inject 82 Units into the skin in the morning and at bedtime. Patient taking differently: Inject 160 Units into the skin in the morning. 12/01/21   Shamleffer, Ibtehal Jaralla, MD  insulin  lispro (HUMALOG  KWIKPEN) 100 UNIT/ML KwikPen Inject 5 Units into the skin daily with breakfast AND 10 Units daily with lunch AND 12 Units daily with supper. Patient taking differently: Inject 5 Units into the skin daily with breakfast AND 10 Units daily with lunch AND 12 Units daily with supper. Continue Humalog  20 units before meals plus your sliding scale 12/01/21   Shamleffer, Ibtehal Jaralla, MD  Insulin  Pen Needle (B-D UF III MINI PEN NEEDLES) 31G X 5 MM MISC Inject 1 Device into the skin in the morning, at noon, in the evening, and at bedtime. 12/01/21   Shamleffer, Ibtehal Jaralla, MD  metFORMIN  (GLUCOPHAGE -XR) 500 MG 24 hr tablet Take 2 tablets (1,000 mg total) by mouth daily with breakfast AND 1 tablet (500 mg total) daily with supper. Patient taking differently: Take 2 tablets (1,000 mg total) by mouth daily with breakfast AND 2 tablets (1000  mg total) daily with supper. 12/01/21   Shamleffer, Ibtehal Jaralla, MD  metoprolol  succinate (TOPROL -XL) 50 MG 24 hr tablet TAKE 1 TABLET BY MOUTH EVERY DAY WITH OR IMMEDIATELY FOLLOWING A MEAL 12/09/23   Cydney Draft, MD  mometasone -formoterol  (DULERA ) 200-5 MCG/ACT AERO Inhale 2 puffs into the lungs 2 (two) times daily. in the morning and at bedtime. 01/28/24   Breeback, Jade L, PA-C  Multiple Vitamin (MULTIVITAMIN) tablet Take 1 tablet by mouth daily.    [provider]  Pitavastatin  Calcium  2 MG  TABS TAKE 1 TABLET BY MOUTH AT BEDTIME. 05/03/23   Cydney Draft, MD  valsartan -hydrochlorothiazide  (DIOVAN -HCT) 320-12.5 MG tablet TAKE 1 TABLET BY MOUTH EVERY DAY 03/06/24   Cydney Draft, MD      Allergies    Trulicity  [dulaglutide ], Crestor  [rosuvastatin ], Jentadueto  [linagliptin -metformin  hcl er], Lipitor [atorvastatin ], Onglyza [saxagliptin ], Ozempic  (0.25 or 0.5 mg-dose) [semaglutide (0.25 or 0.5mg -dos)], and Victoza  [liraglutide ]    Review of Systems   Review of Systems  Musculoskeletal:  Positive for arthralgias and back pain.    Physical Exam Updated Vital Signs BP 114/71   Pulse 84   Temp (!) 97.5 F (36.4 C)   Resp 18   Wt 90.7 kg   SpO2 97%   BMI 37.79 kg/m  Physical Exam Vitals and nursing note reviewed.  Constitutional:      General: She is not in acute distress.    Appearance: She is well-developed. She is obese. She is not ill-appearing, toxic-appearing or diaphoretic.  HENT:     Head: Normocephalic and atraumatic.  Eyes:     Extraocular Movements: Extraocular movements intact.     Conjunctiva/sclera: Conjunctivae normal.  Cardiovascular:     Rate and Rhythm: Normal rate and regular rhythm.     Pulses: Normal pulses.     Heart sounds: Normal heart sounds. No murmur heard. Pulmonary:     Effort: Pulmonary effort is normal. No respiratory distress.     Breath sounds: Normal breath sounds.  Abdominal:     Palpations: Abdomen is soft.     Tenderness: There is no abdominal tenderness.  Musculoskeletal:        General: No swelling.     Cervical back: Normal range of motion and neck supple.     Comments: Mild tenderness to palpation to the left lower lumbar paraspinal muscles.  No bony tenderness, deformity, step-off, ecchymosis noted.  Patient also has tenderness to palpation of the left ribs without obvious deformity or ecchymosis.  Skin:    General: Skin is warm and dry.     Capillary Refill: Capillary refill takes less than 2 seconds.   Neurological:     General: No focal deficit present.     Mental Status: She is alert.     Sensory: No sensory deficit.     Motor: No weakness.  Psychiatric:        Mood and Affect: Mood normal.     ED Results / Procedures / Treatments   Labs (all labs ordered are listed, but only abnormal results are displayed) Labs Reviewed - No data to display  EKG None  Radiology DG Ribs Unilateral W/Chest Left Result Date: 03/08/2024 CLINICAL DATA:  Left chest wall pain.  Recent fall. EXAM: LEFT RIBS AND CHEST - 3+ VIEW COMPARISON:  Chest radiograph dated 01/27/2024. FINDINGS: Minimal bibasilar atelectasis. No focal consolidation, pleural effusion, pneumothorax. The cardiac silhouette is within normal limits. No acute osseous pathology. No displaced rib fractures. IMPRESSION: 1. No displaced rib fractures.  2. Minimal bibasilar atelectasis. Electronically Signed   By: Angus Bark M.D.   On: 03/08/2024 11:10   DG Lumbar Spine Complete Result Date: 03/08/2024 CLINICAL DATA:  Low back pain EXAM: LUMBAR SPINE - COMPLETE 4+ VIEW COMPARISON:  None Available. FINDINGS: No fracture There is mild degenerative disc disease with narrowing L2-L3 L3-L4 and L5-S1 disc spaces with degenerative facet disease L4-5 and L5-S1 without spondylolysis or listhesis IMPRESSION: Mild degenerative disc disease. Electronically Signed   By: Fredrich Jefferson M.D.   On: 03/08/2024 11:08    Procedures Procedures    Medications Ordered in ED Medications  lidocaine  (LIDODERM ) 5 % 2 patch (has no administration in time range)  oxyCODONE -acetaminophen  (PERCOCET/ROXICET) 5-325 MG per tablet 1 tablet (1 tablet Oral Given 03/08/24 1610)    ED Course/ Medical Decision Making/ A&P                                 Medical Decision Making  This patient presents to the ED for concern of fall differential diagnosis includes rib fracture, musculoskeletal pain, vertebral fracture, low back pain    Imaging Studies ordered:  I  ordered imaging studies including left rib and lumbar complete x-rays I independently visualized and interpreted imaging which showed no displaced rib fractures, minimal bibasilar atelectasis.  Mild degenerative disc disease. I agree with the radiologist interpretation   Medicines ordered and prescription drug management:  I ordered medication including Percocet for pain Reevaluation of the patient after these medicines showed that the patient improved I have reviewed the patients home medicines and have made adjustments as needed   Consider for admission or further workup however patient's vital signs, physical exam, and imaging were reassuring.  Patient symptoms likely due to musculoskeletal pain.  Patient given short course of Skelaxin  and Lidoderm  patches outpatient.  Patient to follow-up with primary care if her symptoms persist for further evaluation and treatment.  Patient also advised that she can take Tylenol  and Motrin as needed for pain.        Final Clinical Impression(s) / ED Diagnoses Final diagnoses:  Musculoskeletal pain    Rx / DC Orders ED Discharge Orders          Ordered    metaxalone  (SKELAXIN ) 800 MG tablet  3 times daily        03/08/24 1128    lidocaine  (LIDODERM ) 5 %  Every 24 hours        03/08/24 1128              Carie Charity, PA-C 03/08/24 1128    Owen Blowers P, DO 03/18/24 2327

## 2024-03-08 NOTE — ED Notes (Signed)
 Pt has returned to their room

## 2024-03-08 NOTE — ED Notes (Signed)
 Discharge paperwork reviewed entirely with patient, including follow up care. Pain was under control. The patient received instruction and coaching on their prescriptions, and all follow-up questions were answered.  Pt verbalized understanding as well as all parties involved. No questions or concerns voiced at the time of discharge. No acute distress noted. Pt was encouraged to stay adequately hydrated and eat a healthy diet.   Pt was wheeled out to the PVA in a wheelchair without incident.  Pt advised they will notify their PCP immediately.  The pt was instructed to set up and/or review MyChart for their results; and was informed their Providers all have access to the information as well.

## 2024-03-09 ENCOUNTER — Ambulatory Visit: Admitting: Podiatry

## 2024-03-31 ENCOUNTER — Ambulatory Visit: Payer: Self-pay

## 2024-03-31 ENCOUNTER — Telehealth: Admitting: Physician Assistant

## 2024-03-31 ENCOUNTER — Encounter: Payer: Self-pay | Admitting: Physician Assistant

## 2024-03-31 VITALS — Ht 61.0 in | Wt 201.0 lb

## 2024-03-31 DIAGNOSIS — M5136 Other intervertebral disc degeneration, lumbar region with discogenic back pain only: Secondary | ICD-10-CM | POA: Diagnosis not present

## 2024-03-31 DIAGNOSIS — M545 Low back pain, unspecified: Secondary | ICD-10-CM | POA: Insufficient documentation

## 2024-03-31 MED ORDER — TRAMADOL HCL 50 MG PO TABS: 50.0000 mg | ORAL_TABLET | Freq: Four times a day (QID) | ORAL | 0 refills | Status: AC | PRN

## 2024-03-31 MED ORDER — PREDNISONE 50 MG PO TABS
ORAL_TABLET | ORAL | 0 refills | Status: AC
Start: 2024-03-31 — End: ?

## 2024-03-31 MED ORDER — TIZANIDINE HCL 4 MG PO CAPS: 4.0000 mg | ORAL_CAPSULE | Freq: Three times a day (TID) | ORAL | 0 refills | Status: DC | PRN

## 2024-03-31 NOTE — Progress Notes (Signed)
..  Virtual Visit via Video Note  I connected with Terri Zimmerman on 04/03/24 at 11:10 AM EDT by a video enabled telemedicine application and verified that I am speaking with the correct person using two identifiers.  Location: Patient: home Provider: clinic  .Aaron AasParticipating in visit:  Patient: Terri Zimmerman Provider: Sandy Crumb PA-C   I discussed the limitations of evaluation and management by telemedicine and the availability of in person appointments. The patient expressed understanding and agreed to proceed.  History of Present Illness: Pt is a 58 yo female with lumbar DDD who calls into the clinic to discuss persisting left low back pain after a fall out of bed on 03/06/2024. She was seen at [facility] on 4/23 and given muscle relaxer and lidocaine  patches. They have not helped very much. Pain does not radiate into legs but into buttocks and hips. She denies any leg weakness. There is pain with coughing, sneezing, twisting and moving. Rates pain 9/10 at times. PT has not been helpful in the past and really hard to do with her time.       Observations/Objective: No acute distress Normal breathing Normal mood and appearance .Aaron Aas Today's Vitals   03/31/24 1120  Weight: 201 lb (91.2 kg)  Height: 5\' 1"  (1.549 m)   Body mass index is 37.98 kg/m.    Assessment and Plan: Aaron AasAaron AasMerlie was seen today for medical management of chronic issues.  Diagnoses and all orders for this visit:  Degeneration of intervertebral disc of lumbar region with discogenic back pain -     predniSONE  (DELTASONE ) 50 MG tablet; Take one tablet daily for 5 days. -     traMADol  (ULTRAM ) 50 MG tablet; Take 1 tablet (50 mg total) by mouth every 6 (six) hours as needed for up to 5 days. -     tiZANidine  (ZANAFLEX ) 4 MG capsule; Take 1 capsule (4 mg total) by mouth 3 (three) times daily as needed for muscle spasms.  Acute left-sided low back pain without sciatica -     predniSONE  (DELTASONE ) 50 MG tablet; Take one tablet daily  for 5 days. -     traMADol  (ULTRAM ) 50 MG tablet; Take 1 tablet (50 mg total) by mouth every 6 (six) hours as needed for up to 5 days. -     tiZANidine  (ZANAFLEX ) 4 MG capsule; Take 1 capsule (4 mg total) by mouth 3 (three) times daily as needed for muscle spasms.   Continues to have no red flags.  Prednisone  burst sent.  Changed muscle relaxer to tizanidine .  Tramadol  for break through pain.  Will call and schedule patient with Dr. Elva Hamburger for more interventional planning for ongoing pain.  Discussed low back exercises to help open up back.    Follow Up Instructions:    I discussed the assessment and treatment plan with the patient. The patient was provided an opportunity to ask questions and all were answered. The patient agreed with the plan and demonstrated an understanding of the instructions.   The patient was advised to call back or seek an in-person evaluation if the symptoms worsen or if the condition fails to improve as anticipated.    Tayli Buch, PA-C

## 2024-03-31 NOTE — Telephone Encounter (Signed)
  Chief Complaint: muscle strain Symptoms: neck and left side pain Frequency: x 2 days Pertinent Negatives: Patient denies sob Disposition: [] ED /[] Urgent Care (no appt availability in office) / [x] Appointment(In office/virtual)/ []  [place] [facility]/ [] Home Care/ [] Refused Recommended Disposition /[] [facility]/ []  Follow-up with PCP Additional Notes: pt state that she thinks she reaggrivated a pulled muscle by lifting a heavy box and pulling herself into a big vehicle. States pain started back on yesterday and pain is her neck and left side. States pain is severe and ibuprofen and muscle is not helping. Pt states she would rather have some medication called in and not have to do a visit.   Copied from CRM [id number]. Topic: Clinical - Red Word Triage >> Mar 31, 2024  8:49 AM Tisa Forester wrote: Red Word that prompted transfer to Nurse Triage:  3 or 4 weeks ago originally fell out of bed , patient battling  pull muscles for a month in a left lower side toward back and coming around a little towards her stomach  and side of neck  went to Emergency room was given pain medication and muscle relaxer and the medication is not helping  was given medication for  Patient call back [phone] Reason for Disposition  [1] SEVERE pain (e.g., excruciating, unable to do any normal activities) AND [2] not improved 2 hours after pain medicine  Answer Assessment - Initial Assessment Questions 1. ONSET: "When did the muscle aches or body pains start?"      Left side and abd and neck 2. LOCATION: "What part of your body is hurting?" (e.g., entire body, arms, legs)      Neck, and left side 3. SEVERITY: "How bad is the pain?" (Scale 1-10; or mild, moderate, severe)   - MILD (1-3): doesn't interfere with normal activities    - MODERATE (4-7): interferes with normal activities or awakens from sleep    - SEVERE (8-10):  excruciating pain, unable to do any normal activities      severe 4. CAUSE:  "What do you think is causing the pains?"     Lifting heavy box 5. FEVER: "Have you been having fever?"     no 6. OTHER SYMPTOMS: "Do you have any other symptoms?" (e.g., chest pain, weakness, rash, cold or flu symptoms, weight loss)     no  Protocols used: Muscle Aches and Body Pain-A-AH

## 2024-04-03 ENCOUNTER — Encounter: Payer: Self-pay | Admitting: Physician Assistant

## 2024-04-18 DIAGNOSIS — G4736 Sleep related hypoventilation in conditions classified elsewhere: Secondary | ICD-10-CM | POA: Diagnosis not present

## 2024-04-18 DIAGNOSIS — G4733 Obstructive sleep apnea (adult) (pediatric): Secondary | ICD-10-CM | POA: Diagnosis not present

## 2024-05-01 ENCOUNTER — Ambulatory Visit
Admission: EM | Admit: 2024-05-01 | Discharge: 2024-05-01 | Disposition: A | Discharge status: Home / Self Care | Attending: Family Medicine | Admitting: Family Medicine

## 2024-05-01 ENCOUNTER — Other Ambulatory Visit: Payer: Self-pay

## 2024-05-01 DIAGNOSIS — R051 Acute cough: Secondary | ICD-10-CM

## 2024-05-01 DIAGNOSIS — J438 Other emphysema: Secondary | ICD-10-CM

## 2024-05-01 DIAGNOSIS — F17200 Nicotine dependence, unspecified, uncomplicated: Secondary | ICD-10-CM

## 2024-05-01 DIAGNOSIS — J209 Acute bronchitis, unspecified: Secondary | ICD-10-CM

## 2024-05-01 MED ORDER — DOXYCYCLINE HYCLATE 100 MG PO CAPS: 100.0000 mg | ORAL_CAPSULE | Freq: Two times a day (BID) | ORAL | 0 refills | Status: DC

## 2024-05-01 MED ORDER — PREDNISONE 20 MG PO TABS: 40.0000 mg | ORAL_TABLET | Freq: Every day | ORAL | 0 refills | Status: DC

## 2024-05-01 MED ORDER — HYDROCOD POLI-CHLORPHE POLI ER 10-8 MG/5ML PO SUER: 5.0000 mL | Freq: Two times a day (BID) | ORAL | 0 refills | Status: DC | PRN

## 2024-05-01 NOTE — ED Triage Notes (Signed)
 End of last week started having cough, labored breathing. Inhalers are not working. No fever. Has been taking tylenol  and delsym.

## 2024-05-01 NOTE — ED Provider Notes (Signed)
 [facility]    CSN: [id number] Arrival date & time: 05/01/24  1624      History   Chief Complaint Chief Complaint  Patient presents with   Cough    HPI Terri Zimmerman is a 58 y.o. female.   Terri Zimmerman is a 58 year old smoker with asthma and COPD/emphysema who is here with a cough.  She has been coughing for several days.  She feels short of breath.  Her inhalers are not helping.  She is worried she has bronchitis or pneumonia.  No chest pain.  No sputum production.  States she had to sleep in a chair last night because she did not feel like she could lay down.    Past Medical History:  Diagnosis Date   Asthma 04-25-07   Diabetes mellitus 11-12-06   type 2   Hypertension 11-04-06    Patient Active Problem List   Diagnosis Date Noted   Degeneration of intervertebral disc of lumbar region with discogenic back pain 03/31/2024   Acute left-sided low back pain without sciatica 03/31/2024   Nodule of lower lobe of left lung 10/13/2023   Muscle cramp 10/13/2023   B12 deficiency 10/13/2023   Primary osteoarthritis of right knee 07/30/2021   Aortic atherosclerosis (HCC) 05/26/2021   History of COVID-19 05/16/2021   Tobacco dependence 05/16/2021   Lymphocytosis 05/09/2021   Abnormal QT interval present on electrocardiogram 05/09/2021   Left leg swelling 04/24/2021   Irregular heart rhythm 04/23/2021   Cough, persistent 04/23/2021   Polyarthralgia 04/15/2021   Adrenal adenoma, right 04/15/2021   Trigger finger of left thumb and right ring finger 03/19/2021   Right foot pain 03/19/2021   Elevated cortisol level 01/13/2021   Type 2 diabetes mellitus with hyperglycemia, with long-term current use of insulin  (HCC) 12/31/2020   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   Class 2 obesity due to excess calories without serious comorbidity with body mass index (BMI) of 36.0 to 36.9 in adult 01/22/2020   Low back pain radiating to both legs 10/27/2019   PVC's (premature  ventricular contractions) 04/25/2019   Hematuria 04/18/2019   Primary osteoarthritis of right hip 04/18/2019   OSA (obstructive sleep apnea) 09/02/2018   NAFL (nonalcoholic fatty liver) 06/30/2016   De Quervain's tenosynovitis, left 02/20/2016   Patchy loss of hair 03/02/2015   AMENORRHEA 04/17/2010   Hereditary and idiopathic peripheral neuropathy 10/22/2009   Essential hypertension 11/29/2008   PALPITATIONS, OCCASIONAL 04/12/2008   Diabetes mellitus type 2 with complications (HCC) 01/10/2008   Vitamin D  deficiency 01/10/2008   Allergic rhinitis 01/10/2008   Asthma 01/10/2008    Past Surgical History:  Procedure Laterality Date   ABDOMINAL HYSTERECTOMY  10/2010   Complete   APPENDECTOMY  07/1999   BREAST BIOPSY Left 02/1999   BREAST LUMPECTOMY WITH AXILLARY LYMPH NODE DISSECTION Left 10/2010   FOOT TENDON SURGERY  05/16/2012   bone spur.     LAPAROSCOPY  02/2007   for cyst and endometriosis    OB History   No obstetric history on file.      Home Medications    Prior to Admission medications   Medication Sig Start Date End Date Taking? Authorizing Provider  chlorpheniramine-HYDROcodone  (TUSSIONEX) 10-8 MG/5ML Take 5 mLs by mouth every 12 (twelve) hours as needed for cough. 05/01/24  Yes Stephany Ehrich, MD  doxycycline  (VIBRAMYCIN ) 100 MG capsule Take 1 capsule (100 mg total) by mouth 2 (two) times daily. 05/01/24  Yes Stephany Ehrich, MD  predniSONE  (DELTASONE ) 20  MG tablet Take 2 tablets (40 mg total) by mouth daily with breakfast. 05/01/24  Yes Stephany Ehrich, MD  acetaminophen  (TYLENOL ) 325 MG tablet Take 650 mg by mouth 2 (two) times daily.    [provider]  albuterol  (VENTOLIN  HFA) 108 (90 Base) MCG/ACT inhaler Inhale 2-4 puffs into the lungs every 6 (six) hours as needed for wheezing or shortness of breath. 04/06/23   Cydney Draft, MD  AMBULATORY NON FORMULARY MEDICATION Medication Name: CPAP supplies hose, humidifier  Dx OSA with AHI  99. [employer]: [phone] 02/02/23   Cydney Draft, MD  Continuous Blood Gluc Receiver (DEXCOM G7 RECEIVER) DEVI by Does not apply route. 08/05/22   [provider]  Continuous Glucose Sensor (DEXCOM G7 SENSOR) MISC by Does not apply route. 01/18/23   [provider]  cyanocobalamin  (VITAMIN B12) 500 MCG tablet Take 500 mcg by mouth daily.    [provider]  ferrous sulfate 324 MG TBEC Take 324 mg by mouth daily with breakfast.    [provider]  fluticasone  (FLONASE ) 50 MCG/ACT nasal spray Place 2 sprays into both nostrils daily. 07/07/21 02/13/25  Everlina Hock, NP  glipiZIDE  (GLUCOTROL  XL) 5 MG 24 hr tablet Take 5 mg by mouth daily with breakfast. 11/23/23   [provider]  insulin  degludec (TRESIBA  FLEXTOUCH) 200 UNIT/ML FlexTouch Pen Inject 82 Units into the skin in the morning and at bedtime. Patient taking differently: Inject 160 Units into the skin in the morning. 12/01/21   Shamleffer, Ibtehal Jaralla, MD  insulin  lispro (HUMALOG  KWIKPEN) 100 UNIT/ML KwikPen Inject 5 Units into the skin daily with breakfast AND 10 Units daily with lunch AND 12 Units daily with supper. Patient taking differently: Inject 5 Units into the skin daily with breakfast AND 10 Units daily with lunch AND 12 Units daily with supper. Continue Humalog  20 units before meals plus your sliding scale 12/01/21   Shamleffer, Ibtehal Jaralla, MD  Insulin  Pen Needle (B-D UF III MINI PEN NEEDLES) 31G X 5 MM MISC Inject 1 Device into the skin in the morning, at noon, in the evening, and at bedtime. 12/01/21   Shamleffer, Ibtehal Jaralla, MD  lidocaine  (LIDODERM ) 5 % Place 2 patches onto the skin daily. Remove & Discard patch within 12 hours or as directed by MD 03/08/24   Keith, Kayla N, PA-C  metaxalone  (SKELAXIN ) 800 MG tablet Take 1 tablet (800 mg total) by mouth 3 (three) times daily. 03/08/24   Keith, Kayla N, PA-C  metFORMIN  (GLUCOPHAGE -XR) 500 MG 24 hr tablet Take 2 tablets  (1,000 mg total) by mouth daily with breakfast AND 1 tablet (500 mg total) daily with supper. Patient taking differently: Take 2 tablets (1,000 mg total) by mouth daily with breakfast AND 2 tablets (1000 mg total) daily with supper. 12/01/21   Shamleffer, Ibtehal Jaralla, MD  metoprolol  succinate (TOPROL -XL) 50 MG 24 hr tablet TAKE 1 TABLET BY MOUTH EVERY DAY WITH OR IMMEDIATELY FOLLOWING A MEAL 12/09/23   Cydney Draft, MD  mometasone -formoterol  (DULERA ) 200-5 MCG/ACT AERO Inhale 2 puffs into the lungs 2 (two) times daily. in the morning and at bedtime. 01/28/24   Breeback, Jade L, PA-C  Multiple Vitamin (MULTIVITAMIN) tablet Take 1 tablet by mouth daily.    [provider]  Pitavastatin  Calcium  2 MG TABS TAKE 1 TABLET BY MOUTH AT BEDTIME. 05/03/23   Cydney Draft, MD  tiZANidine  (ZANAFLEX ) 4 MG capsule Take 1 capsule (4 mg total) by mouth 3 (three) times  daily as needed for muscle spasms. 03/31/24   Breeback, Jade L, PA-C  valsartan -hydrochlorothiazide  (DIOVAN -HCT) 320-12.5 MG tablet TAKE 1 TABLET BY MOUTH EVERY DAY 03/06/24   Cydney Draft, MD    Family History Family History  Problem Relation Age of Onset   Other Mother        RA and CHF   Rheum arthritis Mother    Congestive Heart Failure Mother    Diabetes Father    Diabetes Other    Heart disease Other     Social History Social History   Tobacco Use   Smoking status: Every Day    Current packs/day: 0.50    Types: Cigarettes   Smokeless tobacco: Never   Tobacco comments:    Smokes a quarter of a pack a day   Vaping Use   Vaping status: Never Used  Substance Use Topics   Alcohol use: No   Drug use: No     Allergies   Trulicity  [dulaglutide ], Crestor  [rosuvastatin ], Jentadueto  [linagliptin -metformin  hcl er], Lipitor [atorvastatin ], Onglyza [saxagliptin ], Ozempic  (0.25 or 0.5 mg-dose) [semaglutide (0.25 or 0.5mg -dos)], and Victoza  [liraglutide ]   Review of Systems Review of Systems See  HPI  Physical Exam Triage Vital Signs ED Triage Vitals  Encounter Vitals Group     BP 05/01/24 1626 (!) 141/68     Girls Systolic BP Percentile --      Girls Diastolic BP Percentile --      Boys Systolic BP Percentile --      Boys Diastolic BP Percentile --      Pulse Rate 05/01/24 1626 99     Resp 05/01/24 1626 20     Temp 05/01/24 1626 98.6 F (37 C)     Temp src --      SpO2 05/01/24 1626 95 %     Weight --      Height --      Head Circumference --      Peak Flow --      Pain Score 05/01/24 1629 0     Pain Loc --      Pain Education --      Exclude from Growth Chart --    No data found.  Updated Vital Signs BP (!) 141/68   Pulse 99   Temp 98.6 F (37 C)   Resp 20   SpO2 95%      Physical Exam Constitutional:      General: She is in acute distress.     Appearance: She is well-developed. She is ill-appearing.     Comments: Overweight.  Concerned  HENT:     Head: Normocephalic and atraumatic.     Mouth/Throat:     Mouth: Mucous membranes are moist.   Eyes:     Conjunctiva/sclera: Conjunctivae normal.     Pupils: Pupils are equal, round, and reactive to light.    Cardiovascular:     Rate and Rhythm: Normal rate and regular rhythm.     Heart sounds: Normal heart sounds.  Pulmonary:     Effort: Pulmonary effort is normal. No respiratory distress.     Comments: Diminished breath sound bilaterally.  Few scattered wheeze Abdominal:     General: There is no distension.     Palpations: Abdomen is soft.   Musculoskeletal:        General: Normal range of motion.     Cervical back: Normal range of motion.   Skin:    General: Skin is warm and dry.   Neurological:  Mental Status: She is alert.      UC Treatments / Results  Labs (all labs ordered are listed, but only abnormal results are displayed) Labs Reviewed - No data to display  EKG   Radiology No results found.  Procedures Procedures (including critical care time)  Medications  Ordered in UC Medications - No data to display  Initial Impression / Assessment and Plan / UC Course  I have reviewed the triage vital signs and the nursing notes.  Pertinent labs & imaging results that were available during my care of the patient were reviewed by me and considered in my medical decision making (see chart for details).     Final Clinical Impressions(s) / UC Diagnoses   Final diagnoses:  Acute bronchitis, unspecified organism  Other emphysema (HCC)  Tobacco dependence  Acute cough     Discharge Instructions      Make sure you are drinking lots of fluids Continue your usual daily inhalers Take the prednisone  once a day for 5 days Watch your sugars while you are taking prednisone  Take the antibiotic 2 times a day for a week Be sure to take this antibiotic with food I gave you a stronger cough medicine so you can get some sleep.  It can cause drowsiness Need to rest for a couple of days   ED Prescriptions     Medication Sig Dispense Auth. Provider   predniSONE  (DELTASONE ) 20 MG tablet Take 2 tablets (40 mg total) by mouth daily with breakfast. 10 tablet Stephany Ehrich, MD   doxycycline  (VIBRAMYCIN ) 100 MG capsule Take 1 capsule (100 mg total) by mouth 2 (two) times daily. 14 capsule Stephany Ehrich, MD   chlorpheniramine-HYDROcodone  (TUSSIONEX) 10-8 MG/5ML Take 5 mLs by mouth every 12 (twelve) hours as needed for cough. 115 mL Stephany Ehrich, MD      I have reviewed the PDMP during this encounter.   Stephany Ehrich, MD 05/01/24 [phone]

## 2024-05-01 NOTE — Discharge Instructions (Signed)
 Make sure you are drinking lots of fluids Continue your usual daily inhalers Take the prednisone  once a day for 5 days Watch your sugars while you are taking prednisone  Take the antibiotic 2 times a day for a week Be sure to take this antibiotic with food I gave you a stronger cough medicine so you can get some sleep.  It can cause drowsiness Need to rest for a couple of days

## 2024-05-18 DIAGNOSIS — G4736 Sleep related hypoventilation in conditions classified elsewhere: Secondary | ICD-10-CM | POA: Diagnosis not present

## 2024-05-18 DIAGNOSIS — G4733 Obstructive sleep apnea (adult) (pediatric): Secondary | ICD-10-CM | POA: Diagnosis not present

## 2024-05-23 DIAGNOSIS — Z794 Long term (current) use of insulin: Secondary | ICD-10-CM | POA: Diagnosis not present

## 2024-05-23 DIAGNOSIS — E1165 Type 2 diabetes mellitus with hyperglycemia: Secondary | ICD-10-CM | POA: Diagnosis not present

## 2024-05-23 DIAGNOSIS — Z978 Presence of other specified devices: Secondary | ICD-10-CM | POA: Diagnosis not present

## 2024-05-23 DIAGNOSIS — E1142 Type 2 diabetes mellitus with diabetic polyneuropathy: Secondary | ICD-10-CM | POA: Diagnosis not present

## 2024-05-23 DIAGNOSIS — E66812 Obesity, class 2: Secondary | ICD-10-CM | POA: Diagnosis not present

## 2024-05-24 ENCOUNTER — Other Ambulatory Visit: Payer: Self-pay | Admitting: Family Medicine

## 2024-06-03 ENCOUNTER — Other Ambulatory Visit: Payer: Self-pay | Admitting: Family Medicine

## 2024-06-03 DIAGNOSIS — I10 Essential (primary) hypertension: Secondary | ICD-10-CM

## 2024-06-18 DIAGNOSIS — G4733 Obstructive sleep apnea (adult) (pediatric): Secondary | ICD-10-CM | POA: Diagnosis not present

## 2024-06-18 DIAGNOSIS — G4736 Sleep related hypoventilation in conditions classified elsewhere: Secondary | ICD-10-CM | POA: Diagnosis not present

## 2024-06-22 DIAGNOSIS — E1142 Type 2 diabetes mellitus with diabetic polyneuropathy: Secondary | ICD-10-CM | POA: Diagnosis not present

## 2024-06-22 DIAGNOSIS — Z794 Long term (current) use of insulin: Secondary | ICD-10-CM | POA: Diagnosis not present

## 2024-07-14 ENCOUNTER — Other Ambulatory Visit: Payer: Self-pay | Admitting: Family Medicine

## 2024-07-18 ENCOUNTER — Encounter: Payer: Self-pay | Admitting: Sports Medicine

## 2024-07-18 DIAGNOSIS — H40023 Open angle with borderline findings, high risk, bilateral: Secondary | ICD-10-CM | POA: Diagnosis not present

## 2024-07-25 DIAGNOSIS — L570 Actinic keratosis: Secondary | ICD-10-CM | POA: Diagnosis not present

## 2024-07-25 DIAGNOSIS — L821 Other seborrheic keratosis: Secondary | ICD-10-CM | POA: Diagnosis not present

## 2024-08-24 DIAGNOSIS — E1142 Type 2 diabetes mellitus with diabetic polyneuropathy: Secondary | ICD-10-CM | POA: Diagnosis not present

## 2024-08-24 DIAGNOSIS — Z794 Long term (current) use of insulin: Secondary | ICD-10-CM | POA: Diagnosis not present

## 2024-08-24 LAB — HEMOGLOBIN A1C: Hemoglobin A1C: 6.4

## 2024-08-30 ENCOUNTER — Other Ambulatory Visit (HOSPITAL_BASED_OUTPATIENT_CLINIC_OR_DEPARTMENT_OTHER): Payer: Self-pay | Admitting: Family Medicine

## 2024-08-30 DIAGNOSIS — Z139 Encounter for screening, unspecified: Secondary | ICD-10-CM

## 2024-10-01 ENCOUNTER — Other Ambulatory Visit: Payer: Self-pay | Admitting: Family Medicine

## 2024-10-01 DIAGNOSIS — I10 Essential (primary) hypertension: Secondary | ICD-10-CM

## 2024-10-17 ENCOUNTER — Encounter (HOSPITAL_BASED_OUTPATIENT_CLINIC_OR_DEPARTMENT_OTHER): Payer: Self-pay

## 2024-10-17 ENCOUNTER — Ambulatory Visit (HOSPITAL_BASED_OUTPATIENT_CLINIC_OR_DEPARTMENT_OTHER)

## 2024-10-17 ENCOUNTER — Inpatient Hospital Stay (HOSPITAL_BASED_OUTPATIENT_CLINIC_OR_DEPARTMENT_OTHER)
Admission: RE | Admit: 2024-10-17 | Discharge: 2024-10-17 | Disposition: A | Source: Ambulatory Visit | Attending: Family Medicine | Admitting: Family Medicine

## 2024-10-17 DIAGNOSIS — Z1231 Encounter for screening mammogram for malignant neoplasm of breast: Secondary | ICD-10-CM | POA: Diagnosis not present

## 2024-10-17 DIAGNOSIS — Z139 Encounter for screening, unspecified: Secondary | ICD-10-CM | POA: Diagnosis not present

## 2024-10-24 ENCOUNTER — Ambulatory Visit: Payer: Self-pay | Admitting: Family Medicine

## 2024-10-24 NOTE — Progress Notes (Signed)
 Please call patient. Normal mammogram.  Repeat in 1 year.

## 2024-11-03 DIAGNOSIS — R200 Anesthesia of skin: Secondary | ICD-10-CM | POA: Diagnosis not present

## 2024-11-03 DIAGNOSIS — R202 Paresthesia of skin: Secondary | ICD-10-CM | POA: Diagnosis not present

## 2024-11-03 DIAGNOSIS — R42 Dizziness and giddiness: Secondary | ICD-10-CM | POA: Diagnosis not present

## 2024-11-03 DIAGNOSIS — R519 Headache, unspecified: Secondary | ICD-10-CM | POA: Diagnosis not present

## 2024-11-07 ENCOUNTER — Encounter: Payer: Self-pay | Admitting: Family Medicine

## 2024-11-07 ENCOUNTER — Ambulatory Visit (INDEPENDENT_AMBULATORY_CARE_PROVIDER_SITE_OTHER): Admitting: Family Medicine

## 2024-11-07 VITALS — BP 137/70 | HR 45 | Ht 61.0 in | Wt 200.1 lb

## 2024-11-07 DIAGNOSIS — Z720 Tobacco use: Secondary | ICD-10-CM

## 2024-11-07 DIAGNOSIS — Z794 Long term (current) use of insulin: Secondary | ICD-10-CM | POA: Diagnosis not present

## 2024-11-07 DIAGNOSIS — E1165 Type 2 diabetes mellitus with hyperglycemia: Secondary | ICD-10-CM | POA: Diagnosis not present

## 2024-11-07 DIAGNOSIS — Z0000 Encounter for general adult medical examination without abnormal findings: Secondary | ICD-10-CM | POA: Diagnosis not present

## 2024-11-07 DIAGNOSIS — Z23 Encounter for immunization: Secondary | ICD-10-CM

## 2024-11-07 MED ORDER — INSULIN LISPRO (1 UNIT DIAL) 100 UNIT/ML (KWIKPEN): PEN_INJECTOR | SUBCUTANEOUS | 3 refills | Status: AC

## 2024-11-07 NOTE — Patient Instructions (Signed)
 Ok to decrease your Tresiba  down to 120 uinits and decrease mealtime insulin  to 12 units with each meal.    Shoot me a note in about a week if you are still having lows or highs.

## 2024-11-07 NOTE — Progress Notes (Signed)
 "  Complete physical exam  Patient: Terri Zimmerman    DOB: 01-18-1966 58 y.o.   MRN: [id number]  Chief Complaint  Patient presents with   Annual Exam    Subjective:    Terri Zimmerman is a 58 y.o. female who presents today for a complete physical exam. She reports consuming a general diet. The patient does not participate in regular exercise at present. She generally feels well.  She does not have additional problems to discuss today.   Discussed the use of AI scribe software for clinical note transcription with the patient, who gave verbal consent to proceed.  History of Present Illness Terri Zimmerman is a 58 year old female who presents for an annual physical exam.  Diabetes mellitus management and hypoglycemia - Hemoglobin A1c is 6.4, improved from previous higher levels. - Initiated Mounjaro therapy; glipizide  discontinued. - Insulin  dosage reduced to 18 units of Humalog  with meals; continues Tresiba  160 units in the morning. - Weight loss of six pounds. - Frequent hypoglycemic episodes, especially nocturnally, with blood glucose levels dropping into the 40s. - Manages hypoglycemia with glucose tablets. - Expresses concern regarding blood sugar management.  Gastrointestinal symptoms associated with mounjaro - Experiences nausea and aversion to certain foods, particularly red meat. - Prefers foods such as chicken and bread ('white category'). - No issues with bowel movements.  Neurological symptoms - Under care of neurologist for heaviness, vertigo, and numbness and tingling on the right side. - CT scan and neck x-ray scheduled for further evaluation. - Nerve conduction study for neuropathy scheduled for March.  Cardiovascular symptoms and blood pressure monitoring - Blood pressure readings typically range from 120-130/80 mmHg. - Episodes of heart racing have occurred but have subsided. - Expresses concern regarding blood pressure management.  Genitourinary symptoms - No  burning or frequency with urination.  Tobacco use and lung cancer screening consideration - History of smoking. - Considering lung cancer screening due to smoking history.   Most recent fall risk assessment:    11/07/2024    9:22 AM  Fall Risk   Falls in the past year? 0  Number falls in past yr: 0  Injury with Fall? 0  Risk for fall due to : No Fall Risks  Follow up Falls evaluation completed     Most recent depression screenings:    11/07/2024    9:22 AM 02/14/2024   11:09 AM  PHQ 2/9 Scores  PHQ - 2 Score 0 0  PHQ- 9 Score  1      Data saved with a previous flowsheet row definition        Patient Care Team: Alvan Dorothyann BIRCH, MD as PCP - General Tobie Tonita POUR, DO as Consulting Physician (Neurology) Pa, [facility] as Referring Physician (Optometry)   ROS    Objective:    BP 137/70   Pulse (!) 45   Ht 5' 1 (1.549 m)   Wt 200 lb 1.6 oz (90.8 kg)   SpO2 98%   BMI 37.81 kg/m     Physical Exam Vitals and nursing note reviewed.  Constitutional:      Appearance: Normal appearance.  HENT:     Head: Normocephalic and atraumatic.  Eyes:     Conjunctiva/sclera: Conjunctivae normal.  Cardiovascular:     Rate and Rhythm: Normal rate and regular rhythm.  Pulmonary:     Effort: Pulmonary effort is normal.     Breath sounds: Normal breath sounds.  Skin:    General:  Skin is warm and dry.  Neurological:     Mental Status: She is alert.  Psychiatric:        Mood and Affect: Mood normal.       Results for orders placed or performed in visit on 11/07/24  Hemoglobin A1c  Result Value Ref Range   Hemoglobin A1C 6.4          Assessment & Plan:    Routine Health Maintenance and Physical Exam Immunization History  Administered Date(s) Administered   Influenza Inj Mdck Quad Pf 08/30/2021   Influenza Split 09/29/2011, 08/09/2012   Influenza Whole 09/17/2007, 08/19/2010   Influenza, Quadrivalent, Recombinant, Inj, Pf 09/12/2013,  09/20/2014, 09/27/2015, 08/20/2016, 07/20/2017, 07/27/2018   Influenza, Seasonal, Injecte, Preservative Fre 11/07/2024   Influenza,inj,Quad PF,6+ Mos 09/12/2013, 09/20/2014, 09/27/2015, 08/20/2016, 07/20/2017, 07/27/2018, 08/25/2019, 10/28/2020, 07/21/2022   Janssen (J&J) SARS-COV-2 Vaccination 02/23/2020   PNEUMOCOCCAL CONJUGATE-20 10/06/2022   Pneumococcal Polysaccharide-23 10/16/2006, 06/23/2013, 08/16/2013   Td 10/16/2006   Tdap 11/30/2016   Zoster Recombinant(Shingrix ) 03/23/2022, 07/21/2022    Health Maintenance  Topic Date Due   Hepatitis B Vaccines 19-59 Average Risk (1 of 3 - 19+ 3-dose series) Never done   Diabetic kidney evaluation - Urine ACR  05/20/2024   COVID-19 Vaccine (2 - 2025-26 season) 07/17/2024   Lung Cancer Screening  10/27/2024   OPHTHALMOLOGY EXAM  01/16/2025   Diabetic kidney evaluation - eGFR measurement  01/26/2025   FOOT EXAM  02/13/2025   HEMOGLOBIN A1C  02/22/2025   Fecal DNA (Cologuard)  06/09/2025   Mammogram  10/17/2026   DTaP/Tdap/Td (3 - Td or Tdap) 11/30/2026   Pneumococcal Vaccine: 50+ Years  Completed   Influenza Vaccine  Completed   Hepatitis C Screening  Completed   HIV Screening  Completed   Zoster Vaccines- Shingrix   Completed   HPV VACCINES  Aged Out   Meningococcal B Vaccine  Aged Out    Discussed health benefits of physical activity, and encouraged her to engage in regular exercise appropriate for her age and condition.  Problem List Items Addressed This Visit       Endocrine   Type 2 diabetes mellitus with hyperglycemia, with long-term current use of insulin  (HCC)   Relevant Orders   Urine Microalbumin w/creat. ratio   Other Visit Diagnoses       Wellness examination    -  Primary   Relevant Orders   Urine Microalbumin w/creat. ratio   Lipid Panel With LDL/HDL Ratio   CMP14+EGFR   CBC     Encounter for immunization       Relevant Orders   Flu vaccine trivalent PF, 6mos and older(Flulaval,Afluria,Fluarix,Fluzone)  (Completed)     Tobacco abuse       Relevant Orders   Ambulatory Referral Lung Cancer Screening [facility]       Assessment and Plan Assessment & Plan Type 2 diabetes mellitus with hypoglycemia Frequent hypoglycemic episodes, especially nocturnal, with glucose levels in the 40s. Mounjaro effective in reducing insulin  needs and aiding weight loss. Goal to further reduce or discontinue insulin  due to hypoglycemia risks. - Decreased Humalog  to 12 units with meals. - Decreased Tresiba  to 120 units daily. - Monitor blood glucose levels and adjust insulin  as needed. - Communicate with endocrinologist regarding insulin  adjustments. - Consider further reduction of insulin  if hypoglycemia persists.  Essential hypertension Blood pressure well-controlled in the 120s/130s. Current management effective with potential for further reduction through weight loss and improved glucose control. - Continue current antihypertensive regimen. - Monitor  blood pressure regularly. - Adjust antihypertensive therapy if blood pressure consistently falls below 115/75.  Localized enlarged lymph node Puffy lymph node, not hard or matted. Previous ultrasound results unclear, further evaluation may be needed.  General health maintenance Candidate for lung cancer screening due to smoking history. Discussed benefits of early detection with low-dose CT scan. - Referred to pulmonology for lung cancer screening with low-dose CT scan. - Coordinated with insurance for coverage verification.    Return in about 6 months (around 05/08/2025) for Hypertension.    Dorothyann Byars, MD [facility] at [facility]    "

## 2024-11-08 LAB — CMP14+EGFR
ALT: 25 IU/L (ref 0–32)
AST: 26 IU/L (ref 0–40)
Albumin: 4.1 g/dL (ref 3.8–4.9)
Alkaline Phosphatase: 120 IU/L (ref 49–135)
BUN/Creatinine Ratio: 12 (ref 9–23)
BUN: 9 mg/dL (ref 6–24)
Bilirubin Total: 0.3 mg/dL (ref 0.0–1.2)
CO2: 27 mmol/L (ref 20–29)
Calcium: 9.8 mg/dL (ref 8.7–10.2)
Chloride: 101 mmol/L (ref 96–106)
Creatinine, Ser: 0.75 mg/dL (ref 0.57–1.00)
Globulin, Total: 2.3 g/dL (ref 1.5–4.5)
Glucose: 66 mg/dL — ABNORMAL LOW (ref 70–99)
Potassium: 4.1 mmol/L (ref 3.5–5.2)
Sodium: 143 mmol/L (ref 134–144)
Total Protein: 6.4 g/dL (ref 6.0–8.5)
eGFR: 92 mL/min/1.73

## 2024-11-08 LAB — MICROALBUMIN / CREATININE URINE RATIO
Creatinine, Urine: 169.2 mg/dL
Microalb/Creat Ratio: 5 mg/g{creat} (ref 0–29)
Microalbumin, Urine: 8.4 ug/mL

## 2024-11-08 LAB — LIPID PANEL WITH LDL/HDL RATIO
Cholesterol, Total: 126 mg/dL (ref 100–199)
HDL: 37 mg/dL — ABNORMAL LOW
LDL Chol Calc (NIH): 69 mg/dL (ref 0–99)
LDL/HDL Ratio: 1.9 ratio (ref 0.0–3.2)
Triglycerides: 105 mg/dL (ref 0–149)
VLDL Cholesterol Cal: 20 mg/dL (ref 5–40)

## 2024-11-08 LAB — CBC
Hematocrit: 43.6 % (ref 34.0–46.6)
Hemoglobin: 13.6 g/dL (ref 11.1–15.9)
MCH: 26.6 pg (ref 26.6–33.0)
MCHC: 31.2 g/dL — ABNORMAL LOW (ref 31.5–35.7)
MCV: 85 fL (ref 79–97)
Platelets: 274 x10E3/uL (ref 150–450)
RBC: 5.11 x10E6/uL (ref 3.77–5.28)
RDW: 13.7 % (ref 11.7–15.4)
WBC: 13.4 x10E3/uL — ABNORMAL HIGH (ref 3.4–10.8)

## 2024-11-10 ENCOUNTER — Telehealth: Payer: Self-pay

## 2024-11-10 ENCOUNTER — Ambulatory Visit: Payer: Self-pay | Admitting: Family Medicine

## 2024-11-10 DIAGNOSIS — R42 Dizziness and giddiness: Secondary | ICD-10-CM | POA: Diagnosis not present

## 2024-11-10 DIAGNOSIS — R519 Headache, unspecified: Secondary | ICD-10-CM | POA: Diagnosis not present

## 2024-11-10 DIAGNOSIS — R202 Paresthesia of skin: Secondary | ICD-10-CM | POA: Diagnosis not present

## 2024-11-10 DIAGNOSIS — R200 Anesthesia of skin: Secondary | ICD-10-CM | POA: Diagnosis not present

## 2024-11-10 NOTE — Telephone Encounter (Signed)
 Copied from CRM #[id number]. Topic: Clinical - Medication Question >> Nov 10, 2024  2:32 PM Yolanda T wrote: Reason for CRM: patient said she is confused by the instructions on the script for insulin  lispro (HUMALOG  KWIKPEN) 100 UNIT/ML KwikPen as it is not what was discussed at her appt. She picked up the medication but is taking it back to the store. Please advise patient

## 2024-11-10 NOTE — Telephone Encounter (Signed)
 See MyChart message

## 2024-11-10 NOTE — Progress Notes (Signed)
 Labs look good overall.  Hopefully the changes in your insulin  is helping.

## 2024-11-14 ENCOUNTER — Telehealth: Payer: Self-pay | Admitting: Acute Care

## 2024-11-14 DIAGNOSIS — F17210 Nicotine dependence, cigarettes, uncomplicated: Secondary | ICD-10-CM

## 2024-11-14 DIAGNOSIS — Z122 Encounter for screening for malignant neoplasm of respiratory organs: Secondary | ICD-10-CM

## 2024-11-14 DIAGNOSIS — Z87891 Personal history of nicotine dependence: Secondary | ICD-10-CM

## 2024-11-14 NOTE — Telephone Encounter (Signed)
 Lung Cancer Screening Narrative/Criteria Questionnaire (Cigarette Smokers Only- No Cigars/Pipes/vapes)   Terri Zimmerman   SDMV:11/20/2024 [address]      Dec 18, 1965   LDCT: 12/04/2024 0830 MKV    58 y.o.   Phone: [phone]  Lung Screening Narrative (confirm age 9-77 yrs Medicare / 50-80 yrs Private pay insurance)   Insurance information:BCBS   Referring Provider:Dr. Alvan   This screening involves an initial phone call with a team member from our program. It is called a shared decision making visit. The initial meeting is required by  insurance and Medicare to make sure you understand the program. This appointment takes about 15-20 minutes to complete. You will complete the screening scan at your scheduled date/time.  This scan takes about 5-10 minutes to complete. You can eat and drink normally before and after the scan.  Criteria questions for Lung Cancer Screening:   Are you a current or former smoker? Current Age began smoking: 58yo   If you are a former smoker, what year did you quit smoking? N/A(within 15 yrs)   To calculate your smoking history, I need an accurate estimate of how many packs of cigarettes you smoked per day and for how many years. (Not just the number of PPD you are now smoking)   Years smoking 44 x Packs per day 1/2 = Pack years 22   (at least 20 pack yrs)   (Make sure they understand that we need to know how much they have smoked in the past, not just the number of PPD they are smoking now)  Do you have a personal history of cancer?  No    Do you have a family history of cancer? No  Are you coughing up blood?  No  Have you had unexplained weight loss of 15 lbs or more in the last 6 months? No  It looks like you meet all criteria.  When would be a good time for us  to schedule you for this screening?   Additional information: N/A

## 2024-11-20 ENCOUNTER — Encounter: Payer: Self-pay | Admitting: *Deleted

## 2024-11-20 ENCOUNTER — Ambulatory Visit

## 2024-11-20 DIAGNOSIS — F17210 Nicotine dependence, cigarettes, uncomplicated: Secondary | ICD-10-CM | POA: Diagnosis not present

## 2024-11-20 NOTE — Progress Notes (Signed)
 Virtual Visit via Telephone Note  I connected with Arland LITTIE Rung on 11/20/2024 at 10:00 AM EST by telephone and verified that I am speaking with the correct person using two identifiers.  Location: Patient: at home  Provider: [address], [place], [place], [address]    I discussed the limitations, risks, security and privacy concerns of performing an evaluation and management service by telephone and the availability of in person appointments. I also discussed with the patient that there may be a patient responsible charge related to this service. The patient expressed understanding and agreed to proceed.    Shared Decision Making Visit Lung Cancer Screening Program [id number])   Eligibility: Age 12 y.o. Pack Years Smoking History Calculation 22 (# packs/per year x # years smoked) Recent History of coughing up blood  no Unexplained weight loss? no ( >Than 15 pounds within the last 6 months ) Prior History Lung / other cancer no (Diagnosis within the last 5 years already requiring surveillance chest CT Scans). Smoking Status Current Smoker Former Smokers: Years since quit: n/a  Quit Date: n/a  Visit Components: Discussion included one or more decision making aids. yes Discussion included risk/benefits of screening. yes Discussion included potential follow up diagnostic testing for abnormal scans. yes Discussion included meaning and risk of over diagnosis. yes Discussion included meaning and risk of False Positives. yes Discussion included meaning of total radiation exposure. yes  Counseling Included: Importance of adherence to annual lung cancer LDCT screening. yes Impact of comorbidities on ability to participate in the program. yes Ability and willingness to under diagnostic treatment. yes  Smoking Cessation Counseling: Current Smokers:  Discussed importance of smoking cessation. yes Information about tobacco cessation classes and interventions provided to patient.  yes Patient provided with ticket for LDCT Scan. yes Symptomatic Patient. no  Counseling(Intermediate counseling: > three minutes) [address]406 Diagnosis Code: Tobacco Use Z72.0 Asymptomatic Patient yes  Smoking/Tobacco Cessation Counseling TIANN SAHA is a current user of tobacco or nicotine products. She is considering quitting at this time. Counseling provided today addressed the risks of continued use and the benefits of cessation. Discussed tobacco/nicotine use history, readiness to quit, and evidence-based treatment options including behavioral strategies, support resources, and pharmacologic therapies. Provided encouragement and educational materials on steps and resources to quit smoking. Patient questions were addressed, and follow-up recommended for continued support. Total time spent on counseling: 4 minutes.   Former Smokers:  Discussed the importance of maintaining cigarette abstinence. yes Diagnosis Code: Personal History of Nicotine Dependence. S12.108 Information about tobacco cessation classes and interventions provided to patient. Yes Patient provided with ticket for LDCT Scan. no Written Order for Lung Cancer Screening with LDCT placed in Epic. Yes (CT Chest Lung Cancer Screening Low Dose W/O CM) [id number] Z12.2-Screening of respiratory organs Z87.891-Personal history of nicotine dependence   Laneta Speaks, RN

## 2024-11-20 NOTE — Patient Instructions (Signed)

## 2024-12-04 ENCOUNTER — Ambulatory Visit

## 2024-12-04 DIAGNOSIS — F17210 Nicotine dependence, cigarettes, uncomplicated: Secondary | ICD-10-CM

## 2024-12-04 DIAGNOSIS — Z87891 Personal history of nicotine dependence: Secondary | ICD-10-CM

## 2024-12-04 DIAGNOSIS — Z122 Encounter for screening for malignant neoplasm of respiratory organs: Secondary | ICD-10-CM

## 2024-12-06 ENCOUNTER — Telehealth: Payer: Self-pay

## 2024-12-06 NOTE — Telephone Encounter (Signed)
 Copied from CRM #[id number]. Topic: Clinical - Medical Advice >> Dec 06, 2024  1:43 PM Mercer PEDLAR wrote: Reason for CRM: Patient has questions regarding the flu. She stated that she has received the vaccine in December but was exposed to it over the weekend by someone who tested positive. She would like a callback since she has underlying conditions.

## 2024-12-06 NOTE — Telephone Encounter (Signed)
 The patient stated she was exposed to FLU in the last 24 hrs. Her grandson tested positive today and the patient had been caring for her grandson. Requesting if the provider can send in Tamiflu  rx to the [facility] in [place]. Denies any fever, chills, body aches or sore throat at this time. Please advise, thanks.

## 2024-12-06 NOTE — Telephone Encounter (Signed)
 This should be handled by triage

## 2024-12-07 MED ORDER — OSELTAMIVIR PHOSPHATE 75 MG PO CAPS: 75.0000 mg | ORAL_CAPSULE | Freq: Every day | ORAL | 0 refills | Status: AC

## 2024-12-07 NOTE — Telephone Encounter (Signed)
 Meds ordered this encounter  Medications   oseltamivir (TAMIFLU) 75 MG capsule    Sig: Take 1 capsule (75 mg total) by mouth daily.    Dispense:  10 capsule    Refill:  0

## 2024-12-11 ENCOUNTER — Other Ambulatory Visit: Payer: Self-pay

## 2024-12-11 DIAGNOSIS — Z87891 Personal history of nicotine dependence: Secondary | ICD-10-CM

## 2024-12-11 DIAGNOSIS — F17210 Nicotine dependence, cigarettes, uncomplicated: Secondary | ICD-10-CM

## 2024-12-11 DIAGNOSIS — Z122 Encounter for screening for malignant neoplasm of respiratory organs: Secondary | ICD-10-CM

## 2025-05-08 ENCOUNTER — Ambulatory Visit: Admitting: Family Medicine
